# Patient Record
Sex: Male | Born: 1955 | Race: Black or African American | Hispanic: No | Marital: Married | Smoking: Never smoker
Health system: Southern US, Community
[De-identification: ages and names within clinical notes are randomized; demographics above are authoritative.]

## PROBLEM LIST (undated history)

## (undated) DIAGNOSIS — C801 Malignant (primary) neoplasm, unspecified: Secondary | ICD-10-CM

## (undated) DIAGNOSIS — R748 Abnormal levels of other serum enzymes: Secondary | ICD-10-CM

## (undated) DIAGNOSIS — N1831 Chronic kidney disease, stage 3a: Secondary | ICD-10-CM

## (undated) DIAGNOSIS — C7951 Secondary malignant neoplasm of bone: Secondary | ICD-10-CM

## (undated) DIAGNOSIS — I10 Essential (primary) hypertension: Secondary | ICD-10-CM

## (undated) DIAGNOSIS — C61 Malignant neoplasm of prostate: Secondary | ICD-10-CM

## (undated) HISTORY — PX: NO PAST SURGERIES: SHX2092

## (undated) HISTORY — DX: Abnormal levels of other serum enzymes: R74.8

## (undated) HISTORY — DX: Secondary malignant neoplasm of bone: C61

## (undated) HISTORY — DX: Chronic kidney disease, stage 3a: N18.31

## (undated) HISTORY — DX: Malignant neoplasm of prostate: C79.51

---

## 2011-03-17 ENCOUNTER — Inpatient Hospital Stay (HOSPITAL_COMMUNITY)
Admission: EM | Admit: 2011-03-17 | Discharge: 2011-03-20 | DRG: 304 | Disposition: A | Discharge status: Home / Self Care | Payer: Managed Care, Other (non HMO) | Attending: Cardiology | Admitting: Cardiology

## 2011-03-17 ENCOUNTER — Emergency Department (HOSPITAL_COMMUNITY): Payer: Managed Care, Other (non HMO)

## 2011-03-17 DIAGNOSIS — I10 Essential (primary) hypertension: Principal | ICD-10-CM | POA: Diagnosis present

## 2011-03-17 DIAGNOSIS — E119 Type 2 diabetes mellitus without complications: Secondary | ICD-10-CM | POA: Diagnosis present

## 2011-03-17 DIAGNOSIS — I5023 Acute on chronic systolic (congestive) heart failure: Secondary | ICD-10-CM | POA: Diagnosis present

## 2011-03-17 DIAGNOSIS — I509 Heart failure, unspecified: Secondary | ICD-10-CM | POA: Diagnosis present

## 2011-03-17 DIAGNOSIS — Z7982 Long term (current) use of aspirin: Secondary | ICD-10-CM

## 2011-03-17 LAB — COMPREHENSIVE METABOLIC PANEL
ALT: 25 U/L (ref 0–53)
AST: 25 U/L (ref 0–37)
Albumin: 2.9 g/dL — ABNORMAL LOW (ref 3.5–5.2)
Alkaline Phosphatase: 91 U/L (ref 39–117)
CO2: 31 mEq/L (ref 19–32)
Chloride: 105 mEq/L (ref 96–112)
Creatinine, Ser: 0.92 mg/dL (ref 0.4–1.5)
GFR calc Af Amer: >60 mL/min (ref >60)
GFR calc non Af Amer: >60 mL/min (ref >60)
Potassium: 3.4 mEq/L — ABNORMAL LOW (ref 3.5–5.1)
Sodium: 143 mEq/L (ref 135–145)
Total Bilirubin: 0.7 mg/dL (ref 0.3–1.2)

## 2011-03-17 LAB — BASIC METABOLIC PANEL
BUN: 15 mg/dL (ref 6–23)
CO2: 28 mEq/L (ref 19–32)
GFR calc non Af Amer: >60 mL/min (ref >60)
Glucose, Bld: 120 mg/dL — ABNORMAL HIGH (ref 70–99)
Potassium: 3.4 mEq/L — ABNORMAL LOW (ref 3.5–5.1)
Sodium: 141 mEq/L (ref 135–145)

## 2011-03-17 LAB — CBC
MCH: 24.8 pg — ABNORMAL LOW (ref 26.0–34.0)
Platelets: 184 10*3/uL (ref 150–400)
RBC: 5.32 MIL/uL (ref 4.22–5.81)
RDW: 15.9 % — ABNORMAL HIGH (ref 11.5–15.5)
WBC: 5.9 10*3/uL (ref 4.0–10.5)

## 2011-03-17 LAB — HEPARIN LEVEL (UNFRACTIONATED): Heparin Unfractionated: 0.41 IU/mL (ref 0.30–0.70)

## 2011-03-17 LAB — APTT: aPTT: 28 seconds (ref 24–37)

## 2011-03-17 LAB — DIFFERENTIAL
Basophils Relative: 1 % (ref 0–1)
Eosinophils Absolute: 0.1 10*3/uL (ref 0.0–0.7)
Eosinophils Relative: 1 % (ref 0–5)
Neutrophils Relative %: 65 % (ref 43–77)

## 2011-03-17 LAB — CARDIAC PANEL(CRET KIN+CKTOT+MB+TROPI): Total CK: 109 U/L (ref 7–232)

## 2011-03-17 LAB — CK TOTAL AND CKMB (NOT AT ARMC): Total CK: 134 U/L (ref 7–232)

## 2011-03-17 LAB — TROPONIN I: Troponin I: <0.3 ng/mL (ref <0.30)

## 2011-03-18 LAB — CARDIAC PANEL(CRET KIN+CKTOT+MB+TROPI)
CK, MB: 1.5 ng/mL (ref 0.3–4.0)
Relative Index: INVALID (ref 0.0–2.5)
Total CK: 86 U/L (ref 7–232)

## 2011-03-18 LAB — LIPID PANEL
Cholesterol: 110 mg/dL (ref 0–200)
VLDL: 11 mg/dL (ref 0–40)

## 2011-03-18 LAB — BASIC METABOLIC PANEL
CO2: 27 mEq/L (ref 19–32)
Calcium: 8.8 mg/dL (ref 8.4–10.5)
Creatinine, Ser: 1.22 mg/dL (ref 0.4–1.5)
GFR calc Af Amer: >60 mL/min (ref >60)
GFR calc non Af Amer: >60 mL/min (ref >60)
Sodium: 140 mEq/L (ref 135–145)

## 2011-03-18 LAB — CBC
HCT: 36.2 % — ABNORMAL LOW (ref 39.0–52.0)
Hemoglobin: 12.1 g/dL — ABNORMAL LOW (ref 13.0–17.0)
MCV: 74 fL — ABNORMAL LOW (ref 78.0–100.0)
RBC: 4.89 MIL/uL (ref 4.22–5.81)
RDW: 15.8 % — ABNORMAL HIGH (ref 11.5–15.5)
WBC: 6.6 10*3/uL (ref 4.0–10.5)

## 2011-03-18 LAB — MAGNESIUM: Magnesium: 1.7 mg/dL (ref 1.5–2.5)

## 2011-03-19 ENCOUNTER — Inpatient Hospital Stay (HOSPITAL_COMMUNITY): Payer: Managed Care, Other (non HMO)

## 2011-03-19 LAB — CBC
Hemoglobin: 11.3 g/dL — ABNORMAL LOW (ref 13.0–17.0)
Platelets: 162 10*3/uL (ref 150–400)
RBC: 4.59 MIL/uL (ref 4.22–5.81)
WBC: 6 10*3/uL (ref 4.0–10.5)

## 2011-03-19 LAB — HEPARIN LEVEL (UNFRACTIONATED): Heparin Unfractionated: 0.62 IU/mL (ref 0.30–0.70)

## 2011-03-19 MED ORDER — TECHNETIUM TC 99M TETROFOSMIN IV KIT
10.0000 | PACK | Freq: Once | INTRAVENOUS | Status: AC | PRN
  Administered 2011-03-19: 10 via INTRAVENOUS

## 2011-03-19 MED ORDER — TECHNETIUM TC 99M TETROFOSMIN IV KIT
30.0000 | PACK | Freq: Once | INTRAVENOUS | Status: AC | PRN
  Administered 2011-03-19: 30 via INTRAVENOUS

## 2011-03-20 ENCOUNTER — Inpatient Hospital Stay (HOSPITAL_COMMUNITY): Payer: Managed Care, Other (non HMO)

## 2011-03-20 LAB — BASIC METABOLIC PANEL
CO2: 29 mEq/L (ref 19–32)
Calcium: 8.9 mg/dL (ref 8.4–10.5)
Chloride: 101 mEq/L (ref 96–112)
Creatinine, Ser: 1.1 mg/dL (ref 0.4–1.5)
GFR calc Af Amer: >60 mL/min (ref >60)
Glucose, Bld: 141 mg/dL — ABNORMAL HIGH (ref 70–99)
Sodium: 138 mEq/L (ref 135–145)

## 2011-03-20 LAB — CBC
HCT: 35.7 % — ABNORMAL LOW (ref 39.0–52.0)
Hemoglobin: 12.1 g/dL — ABNORMAL LOW (ref 13.0–17.0)
MCH: 25 pg — ABNORMAL LOW (ref 26.0–34.0)
MCHC: 33.9 g/dL (ref 30.0–36.0)
RBC: 4.84 MIL/uL (ref 4.22–5.81)

## 2011-03-20 LAB — GLUCOSE, CAPILLARY

## 2011-04-08 NOTE — Discharge Summary (Signed)
Carlos Martin, Carlos Martin               ACCOUNT NO.:  [id number]  MEDICAL RECORD NO.:  [id number]           PATIENT TYPE:  I  LOCATION:  4730                         FACILITY:  [facility]  PHYSICIAN:  Toddy Boyd N. Sharyn Lull, M.D. DATE OF BIRTH:  [birth year]  DATE OF ADMISSION:  03/17/2011 DATE OF DISCHARGE:  03/20/2011                              DISCHARGE SUMMARY   ADMITTING DIAGNOSES:  Hypertensive urgency, decompensated systolic heart failure, neck fullness, rule out coronary insufficiency, new onset hypertension, glucose intolerance, rule out diabetes mellitus.  DISCHARGE DIAGNOSES:  Status post hypertensive urgency, compensated systolic heart failure, hypertension, new onset diabetes mellitus controlled by diet.  DISCHARGE HOME MEDICATIONS: 1. Aldactone 25 mg 1 tablet twice daily. 2. Enteric-coated aspirin 81 mg 1 tablet daily. 3. Carvedilol 25 mg 1 tablet twice daily. 4. Lisinopril 240 mg 1 tablet twice daily. 5. Benicar 20 mg 1 tablet twice daily. 6. Fish oil over-the-counter 1 capsule daily. 7. Multivitamin 1 tablet daily.  DIET:  Low salt, low cholesterol 1800 calories ADA diet.  The patient has been advised to monitor weight and blood pressure and blood sugar daily.  Avoid sweets.  Follow up with me in 1 week.  Cardiac heart failure instructions have been given.  The patient has been advised to call my office immediately if he develops shortness of breath, leg swelling, or weight gain of over 3 pounds overnight or 5 pounds in a week or if he has any swelling in hand, feet, stomach, or problem breathing.  CONDITION AT DISCHARGE:  Stable.  Follow up with me in 1 week.  BRIEF HISTORY AND HOSPITAL COURSE:  [name] is 55 year old black male with no significant past medical history came to the ER via EMS from PMD office because of progressive increasing shortness of breath and was noted to have blood pressure of 216/150 and was noted in mild congestive heart failure.  The patient  denies any chest pain, nausea, vomiting, diaphoresis but complains of fullness in neck.  Denies palpitation, lightheadedness, or syncope.  The patient does give history of PND, orthopnea, and mild leg swelling.  Denies any cough, fever, flu recently.  He states he could not sleep on the bed and has been sitting in the recliner for last few days.  Denies any cough, fever, or chills. Denies taking any BP meds in the past.  Denies any cardiac workup in the past.  PAST MEDICAL HISTORY:  As above.  PAST SURGICAL HISTORY:  None.  ALLERGIES:  None.  MEDICATION AT HOME:  He takes fish oil capsule and multivitamin every day.  SOCIAL HISTORY:  He is married, 4 children.  No history of smoking. Drinks wine occasionally, works for [employer] in [employer].  FAMILY HISTORY:  Noncontributory.  PHYSICAL EXAMINATION:  GENERAL:  He was alert, awake, oriented x3 in no acute distress. VITAL SIGNS:  Blood pressure was 208/139, pulse was 81 and regular. HEENT:  Conjunctivae was pink. NECK:  Supple, positive JVD.  He had bibasilar rales. CARDIOVASCULAR:  S1 and S2 normal.  There was soft systolic murmur and S4 gallop and S3 gallop. ABDOMEN:  Soft.  Bowel  sounds were present and nontender. EXTREMITIES: There was no clubbing, cyanosis, or edema.  LABORATORY DATA:  Hemoglobin was 13.2, hematocrit 39.5, white count of 5.9.  His sodium was 141, potassium 3.4, BUN 15, creatinine 0.89, glucose was 120.  Pro BNP was 1865, troponin-I was normal.  CPK-MBs were normal.  Repeat pro BNP on May 31 was 1578 which is trending down. Magnesium was 1.7.  Today's CBC is 12.1, hematocrit 35.7, white count of 5.5.  His sodium is 138, potassium 3.9, BUN 19, creatinine 1.10, and glucose is 141.  His chest x-ray done on May 30 showed mild congestive heart failure.  Repeat x-ray done on June 1 showed cardiomegaly with no active disease.  Persantine Myoview done yesterday which showed no evidence of inducible  ischemia, global hypokinesia with EF of 32%.  BRIEF HOSPITAL COURSE:  The patient was admitted to CCU, was started on IV heparin, nitrates, IV Lasix with good diuresis and slowly up titrated his beta-blockers and ACE inhibitors.  The patient subsequently underwent Persantine Myoview which showed no evidence of ischemia with EF of 32%.  The patient has been ambulating in hallway without any problem.  His breathing is back to normal.  Blood pressure is fairly well-controlled.  The patient will be discharged home on above medications and will be followed up in my office in 1 week.  We will repeat his 2-D echo in few months.  If his ejection fraction remains below 35%, we will refer him for ICD.     Eduardo Osier. Sharyn Lull, M.D.     [facility]  D:  03/20/2011  T:  03/20/2011  Job:  [id number]  Electronically Signed by Rinaldo Cloud M.D. on 04/08/2011 09:43:15 AM

## 2011-12-03 ENCOUNTER — Other Ambulatory Visit: Payer: Self-pay | Admitting: Cardiology

## 2012-02-25 ENCOUNTER — Other Ambulatory Visit: Payer: Self-pay | Admitting: Cardiology

## 2012-06-12 ENCOUNTER — Other Ambulatory Visit: Payer: Self-pay | Admitting: Cardiology

## 2013-06-27 ENCOUNTER — Encounter (HOSPITAL_COMMUNITY): Payer: Self-pay | Admitting: Family Medicine

## 2013-06-27 ENCOUNTER — Emergency Department (HOSPITAL_COMMUNITY): Payer: BC Managed Care – PPO

## 2013-06-27 ENCOUNTER — Emergency Department (HOSPITAL_COMMUNITY)
Admission: EM | Admit: 2013-06-27 | Discharge: 2013-06-27 | Disposition: A | Discharge status: Home / Self Care | Payer: BC Managed Care – PPO | Attending: Emergency Medicine | Admitting: Emergency Medicine

## 2013-06-27 DIAGNOSIS — Z79899 Other long term (current) drug therapy: Secondary | ICD-10-CM | POA: Insufficient documentation

## 2013-06-27 DIAGNOSIS — J029 Acute pharyngitis, unspecified: Secondary | ICD-10-CM

## 2013-06-27 DIAGNOSIS — M542 Cervicalgia: Secondary | ICD-10-CM

## 2013-06-27 DIAGNOSIS — M2569 Stiffness of other specified joint, not elsewhere classified: Secondary | ICD-10-CM | POA: Insufficient documentation

## 2013-06-27 DIAGNOSIS — M436 Torticollis: Secondary | ICD-10-CM

## 2013-06-27 DIAGNOSIS — I10 Essential (primary) hypertension: Secondary | ICD-10-CM | POA: Insufficient documentation

## 2013-06-27 HISTORY — DX: Essential (primary) hypertension: I10

## 2013-06-27 LAB — COMPREHENSIVE METABOLIC PANEL
Alkaline Phosphatase: 56 U/L (ref 39–117)
BUN: 20 mg/dL (ref 6–23)
Chloride: 97 mEq/L (ref 96–112)
Creatinine, Ser: 1.06 mg/dL (ref 0.50–1.35)
GFR calc Af Amer: >90 mL/min (ref >90)
GFR calc non Af Amer: 78 mL/min — ABNORMAL LOW (ref >90)
Glucose, Bld: 104 mg/dL — ABNORMAL HIGH (ref 70–99)
Potassium: 5.6 mEq/L — ABNORMAL HIGH (ref 3.5–5.1)
Total Bilirubin: 0.2 mg/dL — ABNORMAL LOW (ref 0.3–1.2)

## 2013-06-27 LAB — CBC
HCT: 34.4 % — ABNORMAL LOW (ref 39.0–52.0)
Hemoglobin: 11.6 g/dL — ABNORMAL LOW (ref 13.0–17.0)
MCV: 73.7 fL — ABNORMAL LOW (ref 78.0–100.0)
WBC: 6.8 10*3/uL (ref 4.0–10.5)

## 2013-06-27 LAB — CSF CELL COUNT WITH DIFFERENTIAL
RBC Count, CSF: 0 /mm3
RBC Count, CSF: 22 /mm3 — ABNORMAL HIGH
Tube #: 1
Tube #: 4
WBC, CSF: 1 /mm3 (ref 0–5)
WBC, CSF: 1 /mm3 (ref 0–5)

## 2013-06-27 LAB — GRAM STAIN

## 2013-06-27 LAB — GLUCOSE, CSF: Glucose, CSF: 69 mg/dL (ref 43–76)

## 2013-06-27 LAB — PROTEIN, CSF: Total  Protein, CSF: 49 mg/dL — ABNORMAL HIGH (ref 15–45)

## 2013-06-27 MED ORDER — IBUPROFEN 800 MG PO TABS
800.0000 mg | ORAL_TABLET | Freq: Once | ORAL | Status: DC
  Filled 2013-06-27: qty 1

## 2013-06-27 MED ORDER — OXYCODONE-ACETAMINOPHEN 5-325 MG PO TABS: ORAL_TABLET | ORAL | Status: DC

## 2013-06-27 MED ORDER — DIAZEPAM 5 MG PO TABS
10.0000 mg | ORAL_TABLET | Freq: Once | ORAL | Status: DC
  Filled 2013-06-27 (×2): qty 2

## 2013-06-27 MED ORDER — MORPHINE SULFATE 4 MG/ML IJ SOLN
6.0000 mg | Freq: Once | INTRAMUSCULAR | Status: AC
  Administered 2013-06-27: 6 mg via INTRAVENOUS
  Filled 2013-06-27: qty 2

## 2013-06-27 MED ORDER — DIAZEPAM 5 MG PO TABS: 5.0000 mg | ORAL_TABLET | Freq: Two times a day (BID) | ORAL | Status: DC

## 2013-06-27 MED ORDER — LORAZEPAM 2 MG/ML IJ SOLN
1.0000 mg | Freq: Once | INTRAMUSCULAR | Status: AC
  Administered 2013-06-27: 1 mg via INTRAVENOUS
  Filled 2013-06-27: qty 1

## 2013-06-27 NOTE — ED Provider Notes (Signed)
CSN: [id number]     Arrival date & time 06/27/13  1105 History   First MD Initiated Contact with Patient 06/27/13 1208     Chief Complaint  Patient presents with  . Neck Pain  . Sore Throat   (Consider location/radiation/quality/duration/timing/severity/associated sxs/prior Treatment) HPI Pt is a 57yo male sent over by [facility] to r/o meningitis.  Pt c/o severe neck pain, 9/10, constant, aching pain in neck, associated with sore throat made worse with swallowing.  Symptoms started yesterday around 1am after waking up. Denies cough, chest pain, SOB, headache, change in vision. Denies fever, n/v/d. Denies trauma to neck. Denies sick contacts or recent travel. Denies previous hx of similar symptoms.   Past Medical History  Diagnosis Date  . Hypertension    History reviewed. No pertinent past surgical history. History reviewed. No pertinent family history. History  Substance Use Topics  . Smoking status: Never Smoker   . Smokeless tobacco: Not on file  . Alcohol Use: No    Review of Systems  Constitutional: Negative for fever and chills.  HENT: Positive for sore throat, neck pain and neck stiffness. Negative for drooling, mouth sores, trouble swallowing and voice change.   Respiratory: Negative for cough and shortness of breath.   Cardiovascular: Negative for chest pain.  Gastrointestinal: Negative for nausea, vomiting, abdominal pain and diarrhea.  Musculoskeletal: Negative for back pain.  Skin: Negative for rash.  All other systems reviewed and are negative.    Allergies  Review of patient's allergies indicates no known allergies.  Home Medications   Current Outpatient Rx  Name  Route  Sig  Dispense  Refill  . amLODipine-olmesartan (AZOR) 10-40 MG per tablet   Oral   Take 1 tablet by mouth daily.         . carvedilol (COREG) 25 MG tablet   Oral   Take 25 mg by mouth daily.         . hydroxypropyl methylcellulose (ISOPTO TEARS) 2.5 % ophthalmic solution   Both Eyes  Place 1 drop into both eyes 4 (four) times daily as needed (dry eyes).         Marland Kitchen lisinopril (PRINIVIL,ZESTRIL) 40 MG tablet   Oral   Take 40 mg by mouth daily.         . Multiple Vitamin (MULTIVITAMIN WITH MINERALS) TABS tablet   Oral   Take 1 tablet by mouth daily.         Marland Kitchen spironolactone (ALDACTONE) 25 MG tablet   Oral   Take 25 mg by mouth daily.         . diazepam (VALIUM) 5 MG tablet   Oral   Take 1 tablet (5 mg total) by mouth 2 (two) times daily.   10 tablet   0   . oxyCODONE-acetaminophen (PERCOCET/ROXICET) 5-325 MG per tablet      Take 1-2 pills every 4-6 hours as needed for pain.   10 tablet   0    BP 124/65  Pulse 75  Temp(Src) 98.6 F (37 C) (Oral)  Resp 17  SpO2 99% Physical Exam  Nursing note and vitals reviewed. Constitutional: He appears well-developed and well-nourished.  Pt sitting comfortably in exam bed. NAD.  HENT:  Head: Normocephalic and atraumatic.  Right Ear: Hearing, tympanic membrane, external ear and ear canal normal.  Left Ear: Hearing, tympanic membrane, external ear and ear canal normal.  Nose: Nose normal.  Mouth/Throat: Uvula is midline and mucous membranes are normal. Posterior oropharyngeal erythema present. No oropharyngeal  exudate or posterior oropharyngeal edema.  Eyes: Conjunctivae are normal. No scleral icterus.  Neck: Normal range of motion. No JVD present. No tracheal deviation present. No thyromegaly present.  Stiff neck, TTP along cervical paraspinal muscles.  Unable to flex, extend, or rotate neck due to pain.  Cardiovascular: Normal rate, regular rhythm and normal heart sounds.   Pulmonary/Chest: Effort normal and breath sounds normal. No stridor. No respiratory distress. He has no wheezes. He has no rales. He exhibits no tenderness.  Abdominal: Soft. Bowel sounds are normal. He exhibits no distension and no mass. There is no tenderness. There is no rebound and no guarding.  Musculoskeletal: Normal range of  motion.  Lymphadenopathy:    He has no cervical adenopathy.  Neurological: He is alert.  Skin: Skin is warm and dry.    ED Course  Procedures (including critical care time) Labs Review Labs Reviewed  CBC - Abnormal; Notable for the following:    Hemoglobin 11.6 (*)    HCT 34.4 (*)    MCV 73.7 (*)    MCH 24.8 (*)    All other components within normal limits  COMPREHENSIVE METABOLIC PANEL - Abnormal; Notable for the following:    Sodium 130 (*)    Potassium 5.6 (*)    Glucose, Bld 104 (*)    Total Protein 8.8 (*)    AST 38 (*)    Total Bilirubin 0.2 (*)    GFR calc non Af Amer 78 (*)    All other components within normal limits  CSF CELL COUNT WITH DIFFERENTIAL - Abnormal; Notable for the following:    RBC Count, CSF 22 (*)    All other components within normal limits  PROTEIN, CSF - Abnormal; Notable for the following:    Total  Protein, CSF 49 (*)    All other components within normal limits  GRAM STAIN  CSF CULTURE  HERPES SIMPLEX VIRUS CULTURE  CSF CELL COUNT WITH DIFFERENTIAL  GLUCOSE, CSF  HERPES SIMPLEX VIRUS(HSV) DNA BY PCR   Imaging Review Ct Head Wo Contrast  06/27/2013   *RADIOLOGY REPORT*  Clinical Data: Sore throat and neck pain  CT HEAD WITHOUT CONTRAST  Technique:  Contiguous axial images were obtained from the base of the skull through the vertex without contrast. Study was obtained within 24 hours of patient arrival at the emergency department.  Comparison: None.  Findings:  The ventricles are normal in size and configuration. There is no mass, hemorrhage, extra-axial fluid collection, or midline shift.  Gray-white compartments are normal.  There is no demonstrable acute infarct.  There is no meningeal region of thickening.  Bony calvarium appears intact.  Mastoid air cells are clear.  There is mucosal thickening in the right maxillary antrum.  IMPRESSION: Right maxillary sinus disease.  Study otherwise unremarkable.   Original Report Authenticated By: Bretta Bang, M.D.   Dg Fluoro Guide Lumbar Puncture  06/27/2013   CLINICAL DATA:  57 year old male with neck pain, neck stiffness.  EXAM: DIAGNOSTIC LUMBAR PUNCTURE UNDER FLUOROSCOPIC GUIDANCE  FLUOROSCOPY TIME:  0 min and 58 seconds.  PROCEDURE: Informed consent was obtained from the patient prior to the procedure, including potential complications of headache, allergy, and pain. With the patient prone, the lower back was prepped with Betadine. 1% Lidocaine was used for local anesthesia. Lumbar puncture was performed at the L2-L3 level using left sub laminar technique, a 3.5 inch x 20 gauge needle with return of clear, colorlessCSF with an opening pressure of 21 cm water.  12 ml of CSF were obtained for laboratory studies. The needle was withdrawn, direct pressure was held , and hemostasis was noted. The patient tolerated the procedure well and there were no apparent complications.  IMPRESSION: Fluoroscopic guided lumbar puncture at L2-L3. 12 mL of CSF collected and sent to the lab for analysis.   Electronically Signed   By: Augusto Gamble M.D.   On: 06/27/2013 17:14    MDM   1. Neck pain   2. Neck stiffness   3. Sore throat    Some concern for meningitis due to c/o stiff neck and sore throat, however, pt appears well, non-toxic. Vitals: unremarkable.   LP attempted by Dr. Ranae Palms but unsuccessful.  Consulted IR for repeat LP.  IR requested CT head.    LP was performed with CSF cultures and cell counts unremarkable.  Pt still appears well, non-toxic. Normal vitals.  Pain has improved with ativan and morphine.  Discussed findings with Dr. Ranae Palms who agrees pt may be sent home with pain medication and muscle relaxer.  No antibiotics are indicated at this time.  Rx: percocet and valium.  Return precautions provided. LP aftercare info packet provided.  Pt and wife verbalized understanding and agreement with tx plan.  Will discharge pt home and have her f/u with [facility] info  provided.. Vitals: unremarkable. Discharged in stable condition.    Discussed pt with attending during ED encounter.   Junius Finner, PA-C 06/27/13 1954

## 2013-06-27 NOTE — Procedures (Signed)
.  Procedure: LP with fluoro guidance. Specimen: CSF, to lab Bleeding: minimal. Complications: None immediate. Patient   -Condition: Stable.  -Disposition:  Stable, back to ED.  full Radiology Report to follow under IMAGING

## 2013-06-27 NOTE — ED Notes (Signed)
Per pt sent here from [facility] to r/o meningitis. sts severe neck pain and soret throat since yesterday. Denies fever, N,V,D.

## 2013-06-28 LAB — HERPES SIMPLEX VIRUS(HSV) DNA BY PCR
HSV 1 DNA: NOT DETECTED
HSV 2 DNA: NOT DETECTED

## 2013-06-28 NOTE — ED Provider Notes (Signed)
Medical screening examination/treatment/procedure(s) were conducted as a shared visit with non-physician practitioner(s) and myself.  I personally evaluated the patient during the encounter Patient referred from urgent care center for 1 day of neck stiffness and concern for meningitis. He  denies fevers, chills, headache, focal weakness or numbness. He has no sick contacts. There is no history of trauma to the neck. Patient with difficulty moving his neck. He has a normal neurologic examination. He has no evidence of increased intracranial pressure.  Discussed the risks and benefits of a lumbar puncture. I explained to him that I do not believe that the patient had meningitis though could not be absolutely sure. Patient's wife stated they wanted to have the procedure performed.   LP procedure note: The patient's signs and written consents. He is aware of the risks and benefits of the procedure. Patient was prepped and draped in a sterile fashion. 3 ml of 1% lidocaine was injected in the L2-L3 . Lumbar puncture was unsuccessful for acquiring CSF fluid in the emergency department. No immediate complications. No bleeding.  Patient had LP performed under fluoroscopy guidance by interventional radiology. They asked that he have a CT of his head performed for the procedure. CSF did not show any evidence of infection. Patient was treated for muscle spasm of the neck. Return precautions have been given.  Loren Racer, MD 06/28/13 1547

## 2013-06-30 LAB — CSF CULTURE: Culture: NO GROWTH

## 2013-06-30 LAB — CSF CULTURE W GRAM STAIN: Gram Stain: NONE SEEN

## 2014-10-24 IMAGING — RF DG FLUORO GUIDE LUMBAR PUNCTURE
1 series · 1 of 1 positions shown · non-contrast
Comparison: none

CLINICAL DATA: 56-year-old male with neck pain, neck stiffness.

[Series 1: run · 1 of 1 slices shown]
[im 1/1]
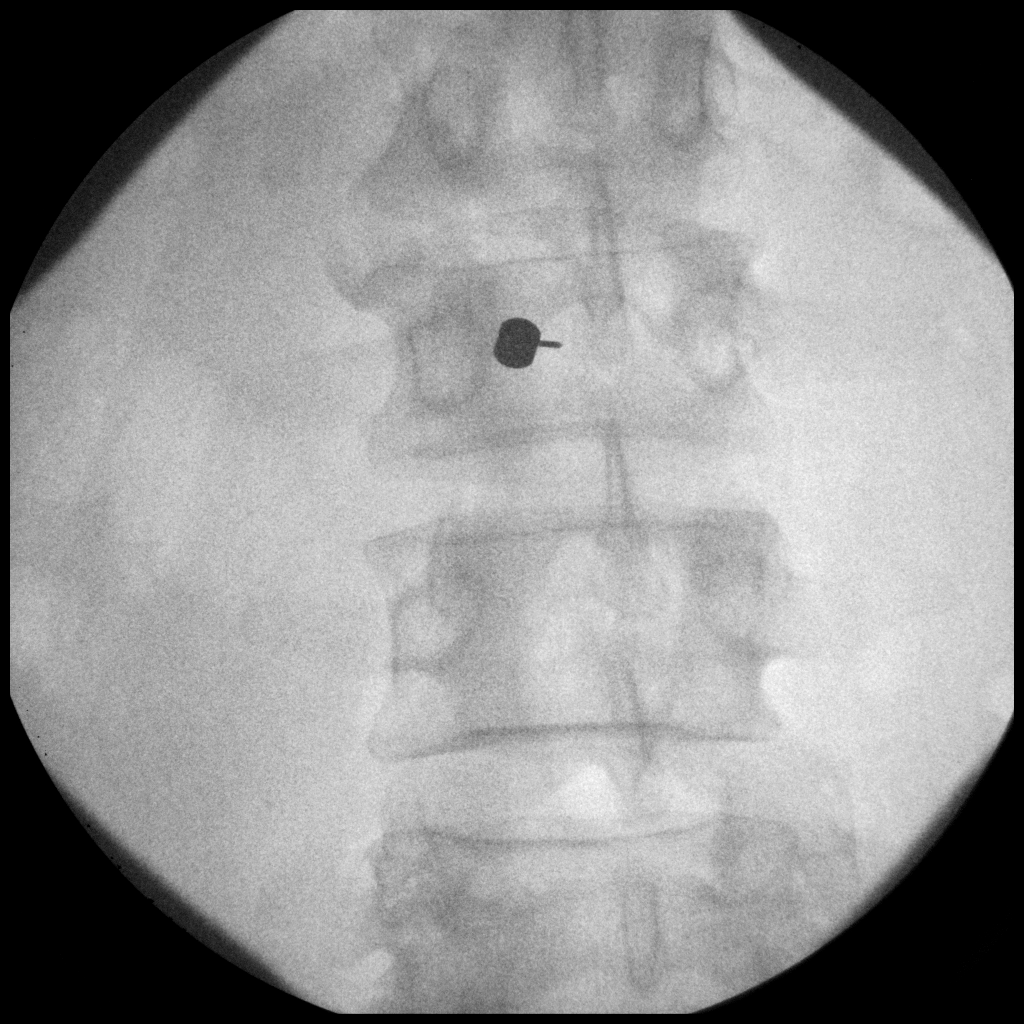

[1 of 1 positions shown; findings below may reference images not displayed]

EXAM:
DIAGNOSTIC LUMBAR PUNCTURE UNDER FLUOROSCOPIC GUIDANCE

FLUOROSCOPY TIME:  0 min and 58 seconds.

PROCEDURE:
Informed consent was obtained from the patient prior to the
procedure, including potential complications of headache, allergy,
and pain. With the patient prone, the lower back was prepped with
Betadine. 1% Lidocaine was used for local anesthesia. Lumbar
puncture was performed at the L2-L3 level using left sub laminar
technique, a 3.5 inch x 20 gauge needle with return of clear,
colorlessCSF with an opening pressure of 21 cm water. 12 ml of CSF
were obtained for laboratory studies. The needle was withdrawn,
direct pressure was held , and hemostasis was noted. The patient
tolerated the procedure well and there were no apparent
complications.
IMPRESSION: Fluoroscopic guided lumbar puncture at L2-L3. 12 mL of CSF collected
and sent to the lab for analysis.

## 2014-10-24 IMAGING — CT CT HEAD W/O CM
1 series · 16 of 30 positions shown, 20 images · non-contrast
Comparison: None.

CLINICAL DATA: Sore throat and neck pain

CT HEAD WITHOUT CONTRAST
TECHNIQUE: Contiguous axial images were obtained from the base of
the skull through the vertex without contrast. Study was obtained
within 24 hours of patient arrival at the emergency department.

[Series 2: head 5.0 h30s · axial · 0.46mm/px · z∈[+1371,+1511]mm · 16 of 32 slices shown, 20 images]
[im 2/32  brain]
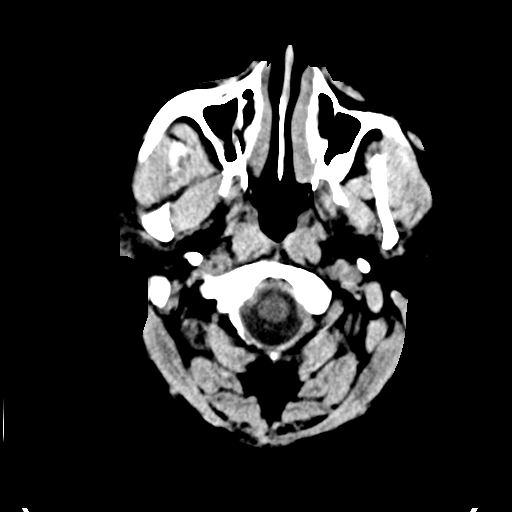
[im 2/32  bone]
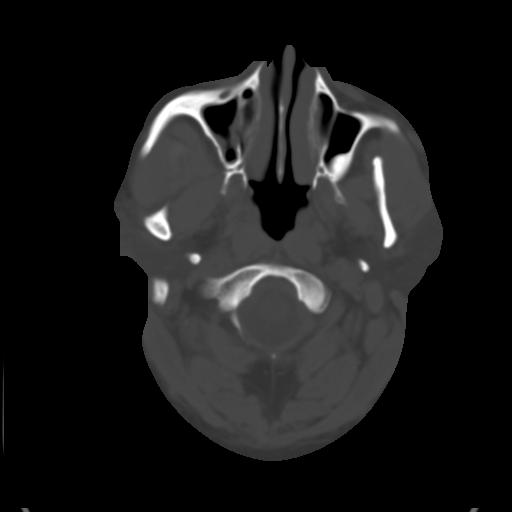
[im 4/32  brain]
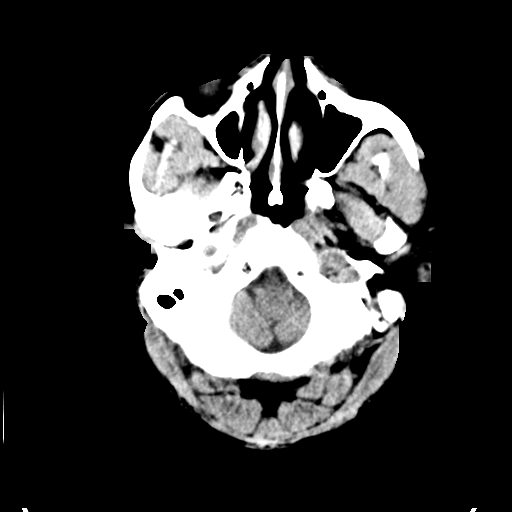
[im 6/32  brain]
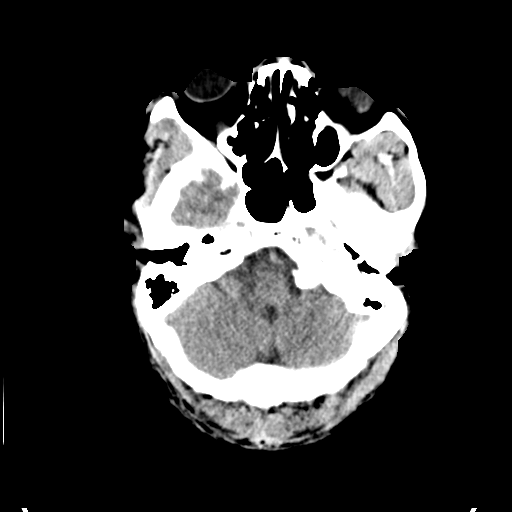
[im 8/32  brain]
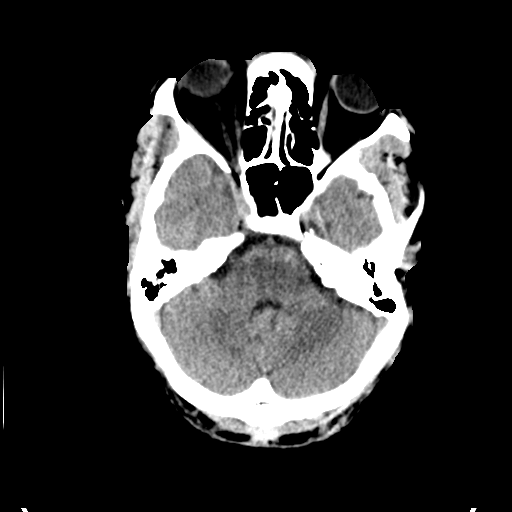
[im 9/32  brain]
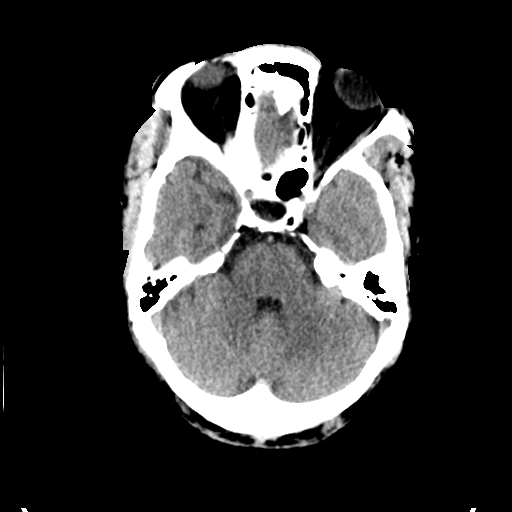
[im 9/32  bone]
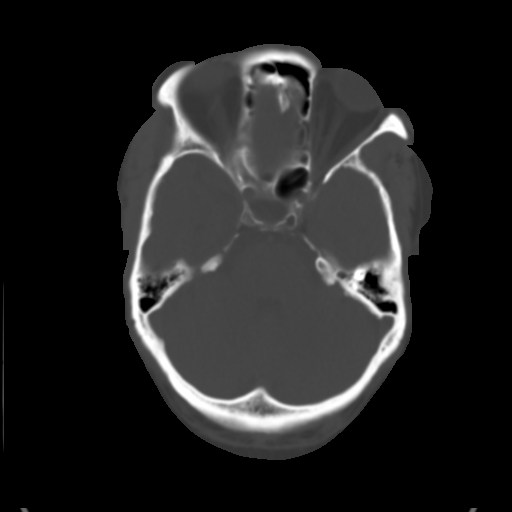
[im 11/32  brain]
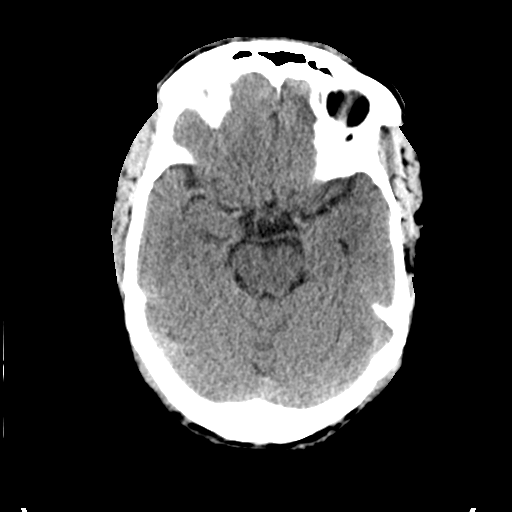
[im 13/32  brain]
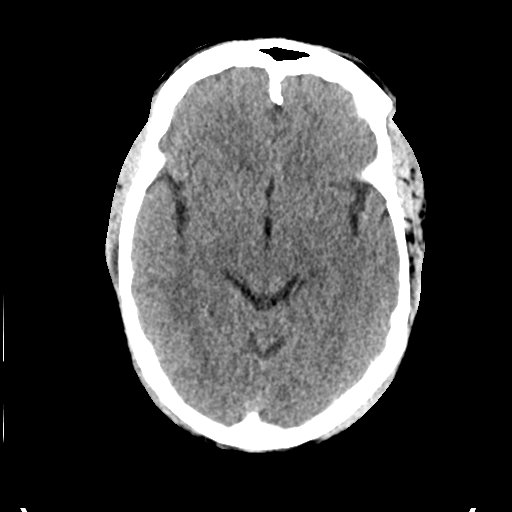
[im 15/32  brain]
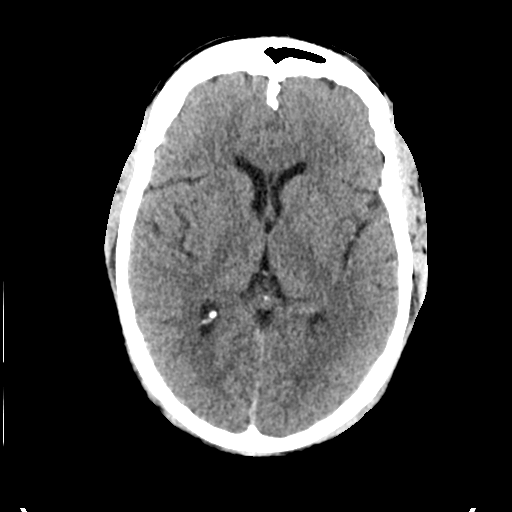
[im 17/32  brain]
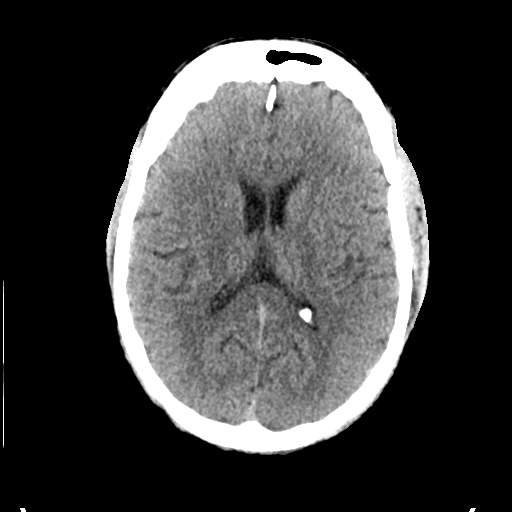
[im 17/32  bone]
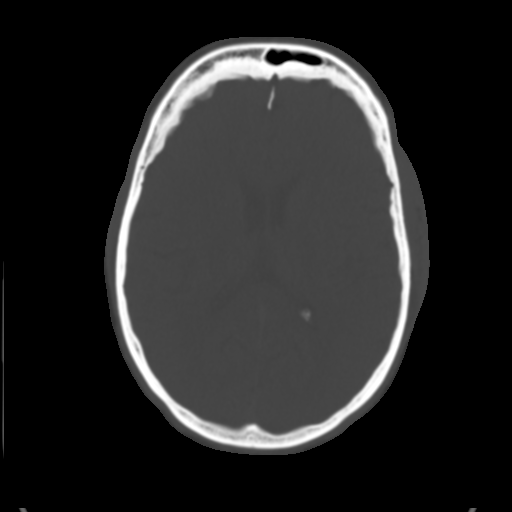
[im 19/32  brain]
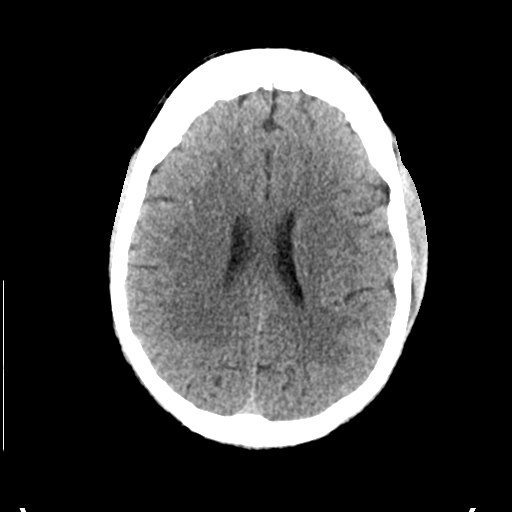
[im 21/32  brain]
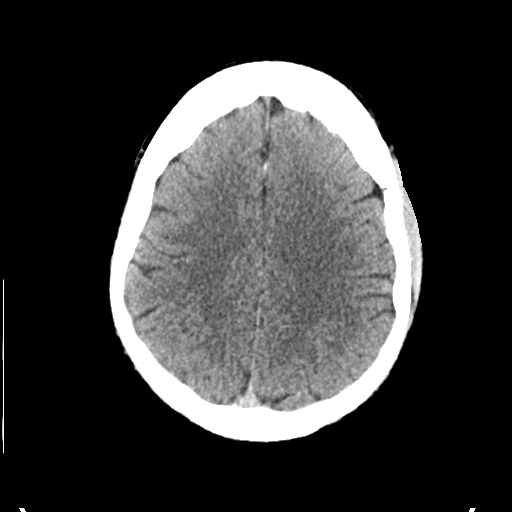
[im 23/32  brain]
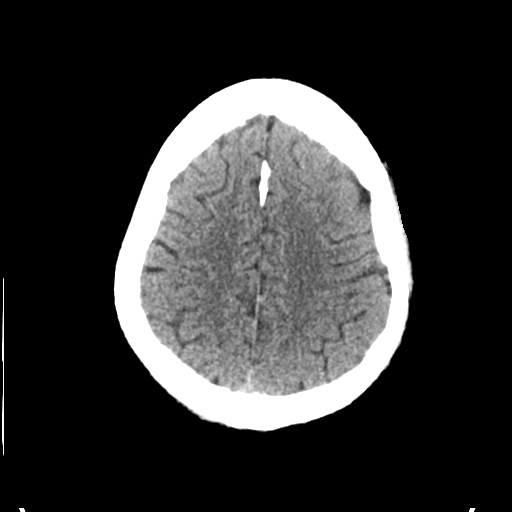
[im 24/32  brain]
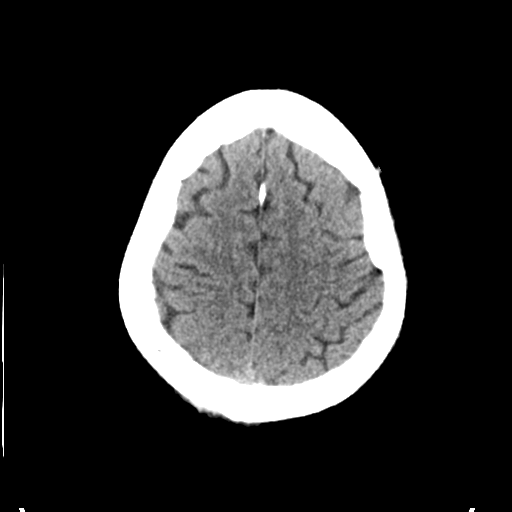
[im 24/32  bone]
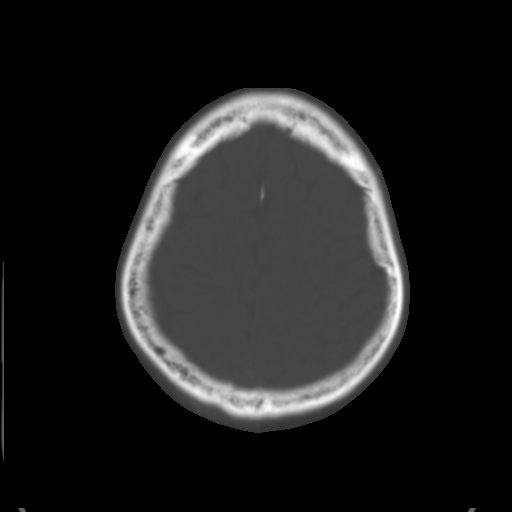
[im 26/32  brain]
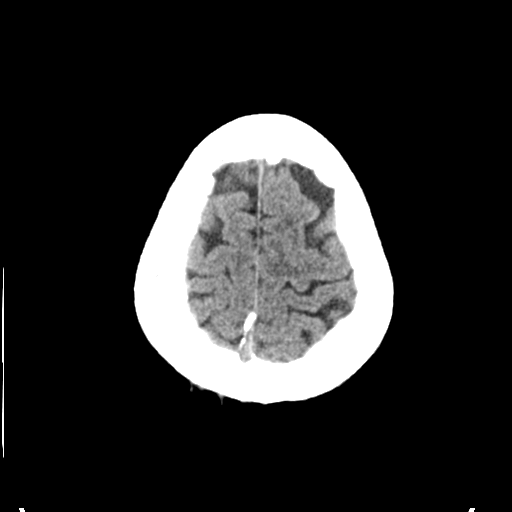
[im 28/32  brain]
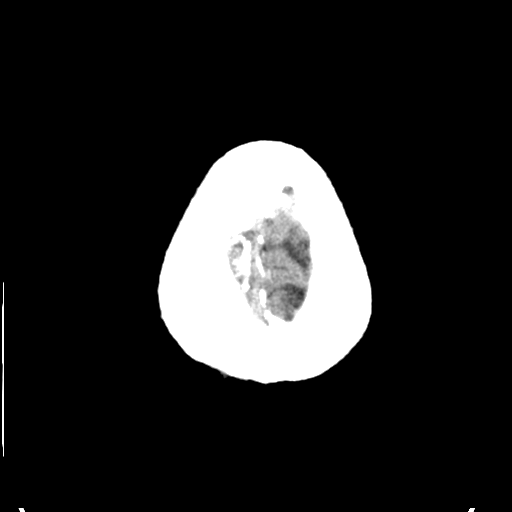
[im 30/32  brain]
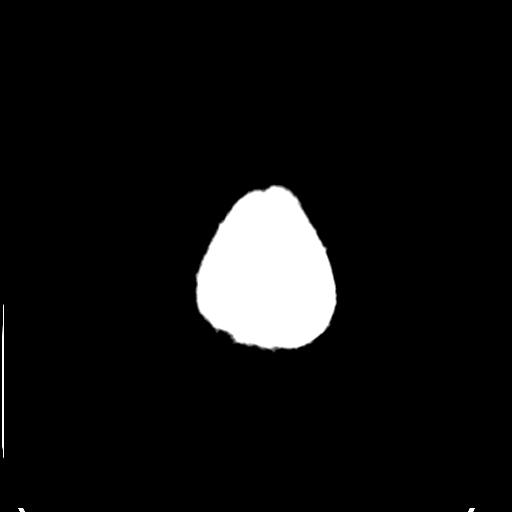

[16 of 30 positions shown; findings below may reference images not displayed]

FINDINGS: The ventricles are normal in size and configuration.
There is no mass, hemorrhage, extra-axial fluid collection, or
midline shift.  Gray-white compartments are normal.  There is no
demonstrable acute infarct.  There is no meningeal region of
thickening.

Bony calvarium appears intact.  Mastoid air cells are clear.  There
is mucosal thickening in the right maxillary antrum.
IMPRESSION: Right maxillary sinus disease.  Study otherwise
unremarkable.

## 2022-07-16 DIAGNOSIS — M545 Low back pain, unspecified: Secondary | ICD-10-CM | POA: Diagnosis not present

## 2022-10-18 HISTORY — PX: COLONOSCOPY: SHX174

## 2022-11-05 DIAGNOSIS — I10 Essential (primary) hypertension: Secondary | ICD-10-CM | POA: Diagnosis not present

## 2022-11-05 DIAGNOSIS — Z6830 Body mass index (BMI) 30.0-30.9, adult: Secondary | ICD-10-CM | POA: Diagnosis not present

## 2022-11-05 DIAGNOSIS — K649 Unspecified hemorrhoids: Secondary | ICD-10-CM | POA: Diagnosis not present

## 2022-11-05 DIAGNOSIS — E119 Type 2 diabetes mellitus without complications: Secondary | ICD-10-CM | POA: Diagnosis not present

## 2023-01-03 DIAGNOSIS — M549 Dorsalgia, unspecified: Secondary | ICD-10-CM | POA: Diagnosis not present

## 2023-01-03 DIAGNOSIS — K649 Unspecified hemorrhoids: Secondary | ICD-10-CM | POA: Diagnosis not present

## 2023-01-03 DIAGNOSIS — Z681 Body mass index (BMI) 19 or less, adult: Secondary | ICD-10-CM | POA: Diagnosis not present

## 2023-01-03 DIAGNOSIS — I10 Essential (primary) hypertension: Secondary | ICD-10-CM | POA: Diagnosis not present

## 2023-02-28 DIAGNOSIS — Z1211 Encounter for screening for malignant neoplasm of colon: Secondary | ICD-10-CM | POA: Diagnosis not present

## 2023-02-28 DIAGNOSIS — K5900 Constipation, unspecified: Secondary | ICD-10-CM | POA: Diagnosis not present

## 2023-02-28 DIAGNOSIS — K625 Hemorrhage of anus and rectum: Secondary | ICD-10-CM | POA: Diagnosis not present

## 2023-02-28 DIAGNOSIS — I5020 Unspecified systolic (congestive) heart failure: Secondary | ICD-10-CM | POA: Diagnosis not present

## 2023-03-04 DIAGNOSIS — M5442 Lumbago with sciatica, left side: Secondary | ICD-10-CM | POA: Diagnosis not present

## 2023-03-04 DIAGNOSIS — Z681 Body mass index (BMI) 19 or less, adult: Secondary | ICD-10-CM | POA: Diagnosis not present

## 2023-03-04 DIAGNOSIS — K649 Unspecified hemorrhoids: Secondary | ICD-10-CM | POA: Diagnosis not present

## 2023-03-04 DIAGNOSIS — I10 Essential (primary) hypertension: Secondary | ICD-10-CM | POA: Diagnosis not present

## 2023-03-04 DIAGNOSIS — M5441 Lumbago with sciatica, right side: Secondary | ICD-10-CM | POA: Diagnosis not present

## 2023-03-08 DIAGNOSIS — D123 Benign neoplasm of transverse colon: Secondary | ICD-10-CM | POA: Diagnosis not present

## 2023-03-08 DIAGNOSIS — K5730 Diverticulosis of large intestine without perforation or abscess without bleeding: Secondary | ICD-10-CM | POA: Diagnosis not present

## 2023-03-08 DIAGNOSIS — Z1211 Encounter for screening for malignant neoplasm of colon: Secondary | ICD-10-CM | POA: Diagnosis not present

## 2023-03-08 DIAGNOSIS — K635 Polyp of colon: Secondary | ICD-10-CM | POA: Diagnosis not present

## 2023-03-08 DIAGNOSIS — K6389 Other specified diseases of intestine: Secondary | ICD-10-CM | POA: Diagnosis not present

## 2023-03-23 ENCOUNTER — Observation Stay (HOSPITAL_COMMUNITY): Payer: 59

## 2023-03-23 ENCOUNTER — Encounter (HOSPITAL_COMMUNITY): Payer: Self-pay | Admitting: Emergency Medicine

## 2023-03-23 ENCOUNTER — Other Ambulatory Visit: Payer: Self-pay

## 2023-03-23 ENCOUNTER — Inpatient Hospital Stay (HOSPITAL_COMMUNITY): Payer: 59

## 2023-03-23 ENCOUNTER — Inpatient Hospital Stay (HOSPITAL_COMMUNITY)
Admission: EM | Admit: 2023-03-23 | Discharge: 2023-03-29 | DRG: 674 | Disposition: A | Discharge status: Home / Self Care | Payer: 59 | Attending: Internal Medicine | Admitting: Internal Medicine

## 2023-03-23 ENCOUNTER — Emergency Department (HOSPITAL_COMMUNITY): Payer: 59

## 2023-03-23 DIAGNOSIS — Z7984 Long term (current) use of oral hypoglycemic drugs: Secondary | ICD-10-CM

## 2023-03-23 DIAGNOSIS — E119 Type 2 diabetes mellitus without complications: Secondary | ICD-10-CM | POA: Diagnosis present

## 2023-03-23 DIAGNOSIS — D539 Nutritional anemia, unspecified: Secondary | ICD-10-CM | POA: Diagnosis not present

## 2023-03-23 DIAGNOSIS — Y846 Urinary catheterization as the cause of abnormal reaction of the patient, or of later complication, without mention of misadventure at the time of the procedure: Secondary | ICD-10-CM | POA: Diagnosis present

## 2023-03-23 DIAGNOSIS — N170 Acute kidney failure with tubular necrosis: Secondary | ICD-10-CM | POA: Diagnosis not present

## 2023-03-23 DIAGNOSIS — N429 Disorder of prostate, unspecified: Secondary | ICD-10-CM

## 2023-03-23 DIAGNOSIS — N132 Hydronephrosis with renal and ureteral calculous obstruction: Secondary | ICD-10-CM | POA: Diagnosis present

## 2023-03-23 DIAGNOSIS — D509 Iron deficiency anemia, unspecified: Secondary | ICD-10-CM | POA: Diagnosis not present

## 2023-03-23 DIAGNOSIS — N1330 Unspecified hydronephrosis: Secondary | ICD-10-CM

## 2023-03-23 DIAGNOSIS — I10 Essential (primary) hypertension: Secondary | ICD-10-CM | POA: Insufficient documentation

## 2023-03-23 DIAGNOSIS — N4289 Other specified disorders of prostate: Secondary | ICD-10-CM | POA: Diagnosis not present

## 2023-03-23 DIAGNOSIS — M4804 Spinal stenosis, thoracic region: Secondary | ICD-10-CM | POA: Diagnosis not present

## 2023-03-23 DIAGNOSIS — E785 Hyperlipidemia, unspecified: Secondary | ICD-10-CM | POA: Diagnosis present

## 2023-03-23 DIAGNOSIS — N320 Bladder-neck obstruction: Secondary | ICD-10-CM | POA: Diagnosis not present

## 2023-03-23 DIAGNOSIS — D638 Anemia in other chronic diseases classified elsewhere: Secondary | ICD-10-CM | POA: Diagnosis present

## 2023-03-23 DIAGNOSIS — K5900 Constipation, unspecified: Secondary | ICD-10-CM | POA: Diagnosis not present

## 2023-03-23 DIAGNOSIS — E875 Hyperkalemia: Secondary | ICD-10-CM | POA: Diagnosis not present

## 2023-03-23 DIAGNOSIS — N179 Acute kidney failure, unspecified: Principal | ICD-10-CM | POA: Diagnosis present

## 2023-03-23 DIAGNOSIS — N1339 Other hydronephrosis: Secondary | ICD-10-CM | POA: Diagnosis not present

## 2023-03-23 DIAGNOSIS — R338 Other retention of urine: Secondary | ICD-10-CM | POA: Diagnosis present

## 2023-03-23 DIAGNOSIS — C61 Malignant neoplasm of prostate: Secondary | ICD-10-CM | POA: Diagnosis not present

## 2023-03-23 DIAGNOSIS — E872 Acidosis, unspecified: Secondary | ICD-10-CM | POA: Diagnosis not present

## 2023-03-23 DIAGNOSIS — G9589 Other specified diseases of spinal cord: Secondary | ICD-10-CM | POA: Diagnosis not present

## 2023-03-23 DIAGNOSIS — N401 Enlarged prostate with lower urinary tract symptoms: Secondary | ICD-10-CM | POA: Diagnosis not present

## 2023-03-23 DIAGNOSIS — D494 Neoplasm of unspecified behavior of bladder: Secondary | ICD-10-CM | POA: Diagnosis not present

## 2023-03-23 DIAGNOSIS — R531 Weakness: Secondary | ICD-10-CM | POA: Diagnosis not present

## 2023-03-23 DIAGNOSIS — N138 Other obstructive and reflux uropathy: Secondary | ICD-10-CM | POA: Diagnosis not present

## 2023-03-23 DIAGNOSIS — G8929 Other chronic pain: Secondary | ICD-10-CM | POA: Diagnosis present

## 2023-03-23 DIAGNOSIS — C7951 Secondary malignant neoplasm of bone: Secondary | ICD-10-CM | POA: Diagnosis not present

## 2023-03-23 DIAGNOSIS — D434 Neoplasm of uncertain behavior of spinal cord: Secondary | ICD-10-CM | POA: Diagnosis not present

## 2023-03-23 DIAGNOSIS — Z79899 Other long term (current) drug therapy: Secondary | ICD-10-CM

## 2023-03-23 DIAGNOSIS — R310 Gross hematuria: Secondary | ICD-10-CM | POA: Diagnosis not present

## 2023-03-23 DIAGNOSIS — M898X8 Other specified disorders of bone, other site: Secondary | ICD-10-CM | POA: Diagnosis not present

## 2023-03-23 DIAGNOSIS — E871 Hypo-osmolality and hyponatremia: Secondary | ICD-10-CM | POA: Diagnosis not present

## 2023-03-23 DIAGNOSIS — M47812 Spondylosis without myelopathy or radiculopathy, cervical region: Secondary | ICD-10-CM | POA: Diagnosis not present

## 2023-03-23 DIAGNOSIS — N134 Hydroureter: Secondary | ICD-10-CM | POA: Diagnosis not present

## 2023-03-23 DIAGNOSIS — C801 Malignant (primary) neoplasm, unspecified: Secondary | ICD-10-CM | POA: Diagnosis not present

## 2023-03-23 LAB — URINALYSIS, ROUTINE W REFLEX MICROSCOPIC
Bacteria, UA: NONE SEEN
Bilirubin Urine: NEGATIVE
Glucose, UA: NEGATIVE mg/dL
Hgb urine dipstick: NEGATIVE
Ketones, ur: NEGATIVE mg/dL
Leukocytes,Ua: NEGATIVE
Nitrite: NEGATIVE
Protein, ur: NEGATIVE mg/dL
Specific Gravity, Urine: 1.008 (ref 1.005–1.030)
pH: 6 (ref 5.0–8.0)

## 2023-03-23 LAB — HEPATIC FUNCTION PANEL
ALT: 10 U/L (ref 0–44)
AST: 18 U/L (ref 15–41)
Albumin: 2.7 g/dL — ABNORMAL LOW (ref 3.5–5.0)
Alkaline Phosphatase: 65 U/L (ref 38–126)
Bilirubin, Direct: <0.1 mg/dL (ref 0.0–0.2)
Total Bilirubin: 0.5 mg/dL (ref 0.3–1.2)
Total Protein: 7.1 g/dL (ref 6.5–8.1)

## 2023-03-23 LAB — BASIC METABOLIC PANEL
Anion gap: 10 (ref 5–15)
BUN: 23 mg/dL (ref 8–23)
CO2: 17 mmol/L — ABNORMAL LOW (ref 22–32)
Calcium: 9.4 mg/dL (ref 8.9–10.3)
Chloride: 106 mmol/L (ref 98–111)
Creatinine, Ser: 3.33 mg/dL — ABNORMAL HIGH (ref 0.61–1.24)
GFR, Estimated: 20 mL/min — ABNORMAL LOW (ref >60)
Glucose, Bld: 100 mg/dL — ABNORMAL HIGH (ref 70–99)
Potassium: 6.6 mmol/L (ref 3.5–5.1)
Sodium: 133 mmol/L — ABNORMAL LOW (ref 135–145)

## 2023-03-23 LAB — CBG MONITORING, ED: Glucose-Capillary: 85 mg/dL (ref 70–99)

## 2023-03-23 LAB — PREPARE RBC (CROSSMATCH)

## 2023-03-23 LAB — PSA: Prostatic Specific Antigen: 524.09 ng/mL — ABNORMAL HIGH (ref 0.00–4.00)

## 2023-03-23 LAB — RENAL FUNCTION PANEL
Albumin: 2.7 g/dL — ABNORMAL LOW (ref 3.5–5.0)
Anion gap: 9 (ref 5–15)
BUN: 25 mg/dL — ABNORMAL HIGH (ref 8–23)
CO2: 18 mmol/L — ABNORMAL LOW (ref 22–32)
Calcium: 8.7 mg/dL — ABNORMAL LOW (ref 8.9–10.3)
Chloride: 107 mmol/L (ref 98–111)
Creatinine, Ser: 3.05 mg/dL — ABNORMAL HIGH (ref 0.61–1.24)
GFR, Estimated: 22 mL/min — ABNORMAL LOW (ref >60)
Glucose, Bld: 135 mg/dL — ABNORMAL HIGH (ref 70–99)
Phosphorus: 4.6 mg/dL (ref 2.5–4.6)
Potassium: 5.4 mmol/L — ABNORMAL HIGH (ref 3.5–5.1)
Sodium: 134 mmol/L — ABNORMAL LOW (ref 135–145)

## 2023-03-23 LAB — FERRITIN: Ferritin: 129 ng/mL (ref 24–336)

## 2023-03-23 LAB — IRON AND TIBC
Iron: 46 ug/dL (ref 45–182)
Saturation Ratios: 17 % — ABNORMAL LOW (ref 17.9–39.5)
TIBC: 269 ug/dL (ref 250–450)
UIBC: 223 ug/dL

## 2023-03-23 LAB — TYPE AND SCREEN
Antibody Screen: NEGATIVE
Unit division: 0

## 2023-03-23 LAB — CBC
HCT: 18.7 % — ABNORMAL LOW (ref 39.0–52.0)
Hemoglobin: 5.9 g/dL — CL (ref 13.0–17.0)
MCH: 21.5 pg — ABNORMAL LOW (ref 26.0–34.0)
MCHC: 31.6 g/dL (ref 30.0–36.0)
MCV: 68.2 fL — ABNORMAL LOW (ref 80.0–100.0)
Platelets: 411 10*3/uL — ABNORMAL HIGH (ref 150–400)
RBC: 2.74 MIL/uL — ABNORMAL LOW (ref 4.22–5.81)
RDW: 17.8 % — ABNORMAL HIGH (ref 11.5–15.5)
WBC: 9.8 10*3/uL (ref 4.0–10.5)
nRBC: 0 % (ref 0.0–0.2)

## 2023-03-23 LAB — ABO/RH: ABO/RH(D): B POS

## 2023-03-23 LAB — GLUCOSE, CAPILLARY
Glucose-Capillary: 117 mg/dL — ABNORMAL HIGH (ref 70–99)
Glucose-Capillary: 125 mg/dL — ABNORMAL HIGH (ref 70–99)

## 2023-03-23 LAB — BPAM RBC
Blood Product Expiration Date: 202406262359
Blood Product Expiration Date: 202406262359
ISSUE DATE / TIME: 202406051510

## 2023-03-23 LAB — PROTIME-INR
INR: 1.3 — ABNORMAL HIGH (ref 0.8–1.2)
Prothrombin Time: 16.1 seconds — ABNORMAL HIGH (ref 11.4–15.2)

## 2023-03-23 LAB — CK: Total CK: 123 U/L (ref 49–397)

## 2023-03-23 MED ORDER — GADOBUTROL 1 MMOL/ML IV SOLN
8.0000 mL | Freq: Once | INTRAVENOUS | Status: AC | PRN
  Administered 2023-03-23: 8 mL via INTRAVENOUS

## 2023-03-23 MED ORDER — HYDROMORPHONE HCL 1 MG/ML IJ SOLN
0.5000 mg | INTRAMUSCULAR | Status: DC | PRN
  Administered 2023-03-23 (×2): 0.5 mg via INTRAVENOUS
  Filled 2023-03-23: qty 0.5
  Filled 2023-03-23: qty 1

## 2023-03-23 MED ORDER — DEXTROSE 50 % IV SOLN
1.0000 | Freq: Once | INTRAVENOUS | Status: AC
  Administered 2023-03-23: 50 mL via INTRAVENOUS
  Filled 2023-03-23: qty 50

## 2023-03-23 MED ORDER — SODIUM CHLORIDE 0.45 % IV SOLN: INTRAVENOUS | Status: DC

## 2023-03-23 MED ORDER — ATORVASTATIN CALCIUM 40 MG PO TABS
40.0000 mg | ORAL_TABLET | Freq: Every day | ORAL | Status: DC
  Administered 2023-03-23 – 2023-03-28 (×6): 40 mg via ORAL
  Filled 2023-03-23 (×6): qty 1

## 2023-03-23 MED ORDER — INSULIN ASPART 100 UNIT/ML IJ SOLN
5.0000 [IU] | Freq: Once | INTRAMUSCULAR | Status: AC
  Administered 2023-03-23: 5 [IU] via INTRAVENOUS

## 2023-03-23 MED ORDER — SODIUM BICARBONATE 8.4 % IV SOLN
50.0000 meq | Freq: Once | INTRAVENOUS | Status: AC
  Administered 2023-03-23: 50 meq via INTRAVENOUS
  Filled 2023-03-23: qty 50

## 2023-03-23 MED ORDER — ACETAMINOPHEN 325 MG PO TABS: 650.0000 mg | ORAL_TABLET | Freq: Four times a day (QID) | ORAL | Status: DC | PRN

## 2023-03-23 MED ORDER — HYDROMORPHONE HCL 1 MG/ML IJ SOLN
1.0000 mg | INTRAMUSCULAR | Status: DC | PRN
  Administered 2023-03-24 – 2023-03-28 (×11): 1 mg via INTRAVENOUS
  Filled 2023-03-23 (×14): qty 1

## 2023-03-23 MED ORDER — SODIUM CHLORIDE 0.9% IV SOLUTION: Freq: Once | INTRAVENOUS | Status: AC

## 2023-03-23 MED ORDER — ACETAMINOPHEN 500 MG PO TABS
1000.0000 mg | ORAL_TABLET | Freq: Four times a day (QID) | ORAL | Status: DC
  Administered 2023-03-23 – 2023-03-29 (×21): 1000 mg via ORAL
  Filled 2023-03-23 (×23): qty 2

## 2023-03-23 MED ORDER — ALBUTEROL SULFATE (2.5 MG/3ML) 0.083% IN NEBU
10.0000 mg | INHALATION_SOLUTION | Freq: Once | RESPIRATORY_TRACT | Status: AC
  Administered 2023-03-23: 10 mg via RESPIRATORY_TRACT
  Filled 2023-03-23: qty 12

## 2023-03-23 MED ORDER — AMLODIPINE BESYLATE 10 MG PO TABS
10.0000 mg | ORAL_TABLET | Freq: Every day | ORAL | Status: DC
  Administered 2023-03-23 – 2023-03-29 (×7): 10 mg via ORAL
  Filled 2023-03-23 (×6): qty 1
  Filled 2023-03-23: qty 2

## 2023-03-23 MED ORDER — SODIUM ZIRCONIUM CYCLOSILICATE 5 G PO PACK
5.0000 g | PACK | Freq: Once | ORAL | Status: AC
  Administered 2023-03-24: 5 g via ORAL
  Filled 2023-03-23: qty 1

## 2023-03-23 MED ORDER — CALCIUM GLUCONATE-NACL 1-0.675 GM/50ML-% IV SOLN
1.0000 g | Freq: Once | INTRAVENOUS | Status: AC
  Administered 2023-03-23: 1000 mg via INTRAVENOUS
  Filled 2023-03-23: qty 50

## 2023-03-23 MED ORDER — CARVEDILOL 25 MG PO TABS
25.0000 mg | ORAL_TABLET | Freq: Two times a day (BID) | ORAL | Status: DC
  Administered 2023-03-23 – 2023-03-29 (×12): 25 mg via ORAL
  Filled 2023-03-23 (×12): qty 1

## 2023-03-23 MED ORDER — ACETAMINOPHEN 650 MG RE SUPP: 650.0000 mg | Freq: Four times a day (QID) | RECTAL | Status: DC | PRN

## 2023-03-23 MED ORDER — HEPARIN SODIUM (PORCINE) 5000 UNIT/ML IJ SOLN: 5000.0000 [IU] | Freq: Three times a day (TID) | INTRAMUSCULAR | Status: DC

## 2023-03-23 MED ORDER — SODIUM CHLORIDE 0.9 % IV BOLUS
1000.0000 mL | Freq: Once | INTRAVENOUS | Status: AC
  Administered 2023-03-23: 1000 mL via INTRAVENOUS

## 2023-03-23 MED ORDER — HYOSCYAMINE SULFATE 0.125 MG PO TBDP
0.2500 mg | ORAL_TABLET | Freq: Four times a day (QID) | ORAL | Status: DC | PRN
  Administered 2023-03-23: 0.25 mg via SUBLINGUAL
  Filled 2023-03-23 (×2): qty 2

## 2023-03-23 MED ORDER — NEPRO/CARBSTEADY PO LIQD
237.0000 mL | Freq: Two times a day (BID) | ORAL | Status: DC
  Administered 2023-03-24 – 2023-03-29 (×9): 237 mL via ORAL

## 2023-03-23 MED ORDER — SODIUM ZIRCONIUM CYCLOSILICATE 10 G PO PACK
10.0000 g | PACK | Freq: Once | ORAL | Status: AC
  Administered 2023-03-23: 10 g via ORAL
  Filled 2023-03-23: qty 1

## 2023-03-23 NOTE — H&P (Addendum)
Date: 03/23/2023               Patient Name:  Carlos Martin MRN: [id number]  DOB: 04/17/1956 Age / Sex: 67 y.o., male   PCP: Default, Provider, MD         Medical Service: Internal Medicine Teaching Service         Attending Physician: Dr. Earl Lagos, MD      First Contact: Dr. Modena Slater, DO Pager [phone]    Second Contact: Dr. Elza Rafter, DO Pager [phone]         After Hours (After 5p/  First Contact Pager: [phone]  weekends / holidays): Second Contact Pager: [phone]   SUBJECTIVE   Chief Complaint: Back pain  History of Present Illness: Elgene Louvier is a 67 yo male HTN, HLD, and T2DM who presents to [facility] with back pain that has been ongoing for about 2 months. While the patient does a lot of heavy lifting for his job at the [employer], he notes that this pain is unrelated and he woke up with the pain suddenly one day. He went to a [facility] for this back pain a few weeks ago and was given Flexeril, which improved his pain slightly, but notes that is has gotten worse, thus he presented to the ED. The pain does not radiate anywhere, but he does have numbness/tingling in his toes.   In addition to the back pain, the patient also notes that he has had difficulty urinating for 3-4 months. He experiences urinary urgency, but has very little urine output and has to strain to urinate, noting that he does not think he completely empties his bladder. The patient also experiences nocturia and wakes up 5+ times each night to urinate, although again, he has very little urine output. He denies hematuria.  The patient also has been experiencing lower abdominal pain, but notes that since he had a foley catheter placed in the ED, his pain has improved some. He denies any nausea, diarrhea, constipation, melena, or hematochezia. His last BM was yesterday and was normal. The patient states that he had one isolated episode of vomiting this morning, but denies any  hematemesis. He also denies any fevers, chills, night sweats, chest pain, or SOB. He notes that he has lost about 15 lbs unintentionally in the last month. He is unsure why, and notes that his appetite has been normal.    ED Course: BMP notable for K of 6.6, Cr 3.33. CBC with microcytic anemia (Hb 5.9), no leukocytosis. Urinalysis unremarkable. CT renal stone study notable for urinary outlet obstruction with bilateral hydronephrosis d/t bulky prostate mass. CT also noted extensive osseous metastatic disease with suspected epidural tumor at lower thoracic spine.   Meds:  Carvedilol 25 mg daily (supposed to be BID) Cleda Daub 50 mg  Losartan 100 mg Amlodipine 10 mg Metformin 1000 mg -- 2 tablets Atorvastatin 40 mg Multivitamin Lantanoprost eye drops No outpatient medications have been marked as taking for the 03/23/23 encounter [facility] Encounter).    Past Medical History  History reviewed. No pertinent surgical history.  Past Surgical History: Colonoscopy 03/08/23: 2 polyps noted in colon, diverticula, and melanosis visualized.   Social:  Lives With: wife in [place]  Occupation: Works at [employer] center - job involves heavy lifting Support: 4 kids (ages 71, 81, 73, 40) Level of Function: independent of ADL, iADLs [facility] Sharyn Lull (sees every 3 months) Substances: No tobacco, occasional alcohol (once a month),  no other substances  Family History:  Negative for HTN, DM, cardiovascular disease No family hisotry of cancer  Allergies: Allergies as of 03/23/2023   (No Known Allergies)    Review of Systems: A complete ROS was negative except as per HPI.   OBJECTIVE:   Physical Exam: Blood pressure (!) 166/83, pulse 100-120, temperature 98.2 F (36.8 C), temperature source Oral, resp. rate 18, height 5\' 9"  (1.753 m), weight 86.2 kg, SpO2 100 %.   Constitutional: well-appearing elderly male laying in bed, in no acute distress Cardiovascular:  tachycardic rate and rhythm, no m/r/g Pulmonary/Chest: normal work of breathing on room air, lungs clear to auscultation bilaterally Abdominal: soft, slightly distended abdomen. Tenderness to palpation in suprapubic region. No CVA tenderness. MSK: normal bulk and tone, no midline vertebral tenderness to palpation. Neurological: alert & oriented x 3, no focal deficits GU: Foley catheter in place, draining pink-tinged urine Skin: warm and dry Psych: normal mood and affect  Labs: CBC    Component Value Date/Time   WBC 9.8 03/23/2023 0504   RBC 2.74 (L) 03/23/2023 0504   HGB 5.9 (LL) 03/23/2023 0504   HCT 18.7 (L) 03/23/2023 0504   PLT 411 (H) 03/23/2023 0504   MCV 68.2 (L) 03/23/2023 0504   MCH 21.5 (L) 03/23/2023 0504   MCHC 31.6 03/23/2023 0504   RDW 17.8 (H) 03/23/2023 0504   LYMPHSABS 1.6 03/17/2011 1050   MONOABS 0.4 03/17/2011 1050   EOSABS 0.1 03/17/2011 1050   BASOSABS 0.0 03/17/2011 1050     CMP     Component Value Date/Time   NA 133 (L) 03/23/2023 0504   K 6.6 (HH) 03/23/2023 0504   CL 106 03/23/2023 0504   CO2 17 (L) 03/23/2023 0504   GLUCOSE 100 (H) 03/23/2023 0504   BUN 23 03/23/2023 0504   CREATININE 3.33 (H) 03/23/2023 0504   CALCIUM 9.4 03/23/2023 0504   PROT 8.8 (H) 06/27/2013 1212   ALBUMIN 4.3 06/27/2013 1212   AST 38 (H) 06/27/2013 1212   ALT 25 06/27/2013 1212   ALKPHOS 56 06/27/2013 1212   BILITOT 0.2 (L) 06/27/2013 1212   GFRNONAA 20 (L) 03/23/2023 0504   GFRAA >90 06/27/2013 1212    Imaging: CT Renal Stone: Urinary outlet obstruction with bilateral hydronephrosis due to bulky prostate mass invading the bladder base and pelvic fat. The mass is indistinguishable from the anterior wall of the rectum.   There is extensive osseous metastatic disease with suspected epidural tumor at the lower thoracic spine.    EKG: personally reviewed my interpretation is normal sinus rhythm with peaked T waves in V3, V4. Prior EKG reviewed and largely  unchanged aside from T waves.   ASSESSMENT & PLAN:   Assessment & Plan by Problem: Principal Problem:   AKI (acute kidney injury) (HCC) Active Problems:   Hyperkalemia   Microcytic anemia   Metabolic acidosis, normal anion gap (NAG)   Essential hypertension, benign   Quency Kitzinger is a 67 y.o. person living with a history of HTN, HLD, and T2DM who presented with back pain and admitted for AKI, hyperkalemia, and acute anemia, with concerns for metastatic prostate cancer on hospital day 0  AKI Bilateral hydronephrosis Unable to see any recent labs, but in 2016 patient had normal kidney function, with Cr of 1.12, but today, Cr elevated to 3.33 with a GFR of 20. Urinalysis was unremarkable, but CT Renal stone study showed a urinary outlet obstruction with bilateral hydronephrosis due to a bulky prostate mass invading the bladder  base. The patient had a foley catheter placed in the ED with ~500 cc urine output, blood tinged. He is s/p 1L BS bolus as well.  - Appreciate nephrology recommendations - 0.45% NS 100cc/hr - Daily BMP - Avoid nephrotoxins - IV dilaudid 0.5 mg q4h PRN for severe pain from foley catheter  Hyperkalemia K elevated to 6.6, likely secondary to AKI as well as ongoing losartan and spironolactone use. EKG did show some peaked T waves in V3, V4. Patient was given calcium gluconate, insulin/D50, and lokelma in the ED. - Trend electrolytes - Hold losartan and spironolactone  Microcytic anemia Hb 5.9, MCV 68.2. He denies any evidence of GI bleed. Had recent colonoscopy last month that only showed 2 polyps in the colon and diverticula. 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 - 2u PRBC ordered - Follow up post-transfusion H&H - Daily CBC  Suspicion for metastatic prostate cancer The patient has a history of back pain, as well as unintentional weight loss of 15 lbs in the last month. As noted above, CT noted a prostate mass invading into the  bladder with evidence of metastatic disease in the spine, as well as possible epidural tumor. Urology was consulted from the ED. - Appreciate urology recs - MRI thoracic/lumbar spine pending - Follow up PSA, testosterone levels  Non anion gap metabolic acidosis  Bicarb 17, gap within normal limits at 10 on BMP. He received 1 amp of sodium bicarb in the ED. Acidosis is likely 2/2 to AKI from the bladder outlet obstruction. - Daily BMP  Hypertension Tachycardia SBP elevated in the 160s and the patient is tachycardic in the 100-120s during my evaluation. He is on losartan 100 mg daily, carvedilol 25 mg daily, spironolactone 50 mg daily, and amlodipine 10 mg daily for his BP, which is managed by Dr. Sharyn Lull as an outpatient.  - Resume amlodipine, carvedilol - Hold losartan and spiro given AKI and hyperkalemia  Type 2 diabetes The patient is on metformin 1000 mg BID at home.  - CBG monitoring with meals - Hold metformin given AKI  - Add SSI if CBGs elevated  Hyperlipidemia On atorvastatin 40 mg daily at home. Resumed home med.    Diet: Renal VTE: SCDs IVF: NS,Bolus Code: Full  Prior to Admission Living Arrangement: Home, living wife Anticipated Discharge Location: Home Barriers to Discharge: workup, medical stability  Dispo: Admit patient to Inpatient with expected length of stay greater than 2 midnights.  Signed: Rayvon Char Internal Medicine Resident PGY-2 Pager: [phone]  03/23/2023, 10:20 AM

## 2023-03-23 NOTE — ED Provider Notes (Signed)
Care assumed from Will Randall, [place] at shift change. Please see their note for further information.   Briefly: Patient presents with 8 weeks of back pain, urinary retention, bilateral leg weakness. Just had a colonoscopy a few weeks ago that showed a few polyps that were removed but not mass. No neuro deficits.   Plan: hyperkalemia at 6.6, no EKG changes. New kidney failure, hgb 5 likely due to kidney failure. Plan for admission pending CT renal study to r/o obstructive cause of CKD.  Physical Exam  BP (!) 166/83 (BP Location: Right Arm)   Pulse 81   Temp 98.2 F (36.8 C) (Oral)   Resp 18   Ht 5\' 9"  (1.753 m)   Wt 86.2 kg   SpO2 100%   BMI 28.06 kg/m   Physical Exam Vitals and nursing note reviewed.  Constitutional:      General: He is not in acute distress.    Appearance: Normal appearance. He is normal weight. He is not ill-appearing, toxic-appearing or diaphoretic.  HENT:     Head: Normocephalic and atraumatic.  Cardiovascular:     Rate and Rhythm: Normal rate.  Pulmonary:     Effort: Pulmonary effort is normal. No respiratory distress.  Musculoskeletal:        General: Normal range of motion.     Cervical back: Normal range of motion.  Skin:    General: Skin is warm and dry.  Neurological:     General: No focal deficit present.     Mental Status: He is alert.     Sensory: Sensation is intact.     Motor: Motor function is intact.     Gait: Gait is intact.     Deep Tendon Reflexes:     Reflex Scores:      Patellar reflexes are 2+ on the right side and 2+ on the left side.    Comments: Ambulatory with steady gait. 5/5 strength and sensation intact to bilateral lower extremities. DP and PT pulses intact and 2+.  Psychiatric:        Mood and Affect: Mood normal.        Behavior: Behavior normal.     Procedures  .Critical Care  Performed by: Silva Bandy, PA-C Authorized by: Silva Bandy, PA-C   Critical care provider statement:    Critical care time  (minutes):  30   Critical care start time:  03/23/2023 7:00 AM   Critical care end time:  03/23/2023 7:30 AM   Critical care was necessary to treat or prevent imminent or life-threatening deterioration of the following conditions:  Renal failure and metabolic crisis   Critical care was time spent personally by me on the following activities:  Development of treatment plan with patient or surrogate, discussions with consultants, discussions with primary provider, evaluation of patient's response to treatment, examination of patient, obtaining history from patient or surrogate, ordering and review of laboratory studies, ordering and review of radiographic studies, pulse oximetry and re-evaluation of patient's condition   Care discussed with: admitting provider     ED Course / MDM    Medical Decision Making Amount and/or Complexity of Data Reviewed Labs: ordered. Radiology: ordered.  Risk Prescription drug management.   This patient is a 67 y.o. male who presents to the ED for concern of back pain, this involves an extensive number of treatment options, and is a complaint that carries with it a high risk of complications and morbidity. The emergent differential diagnosis prior to evaluation includes,  but is not limited to,  Fracture (acute/chronic), muscle strain, cauda equina, spinal stenosis, DDD, ligamentous injury, disk herniation, metastatic cancer, vertebral osteomyelitis, kidney stone, pyelonephritis, AAA, pancreatitis, bowel obstruction, meningitis.  This is not an exhaustive differential.   Past Medical History / Co-morbidities / Social History: Hx hypertension, no pcp or lab work in 9 years  Additional history: Chart reviewed. Pertinent results include: patient with normal colonoscopy a few weeks ago, only polyps no masses.   Physical Exam: Physical exam performed. The pertinent findings include: no focal neurologic deficits  Lab Tests: Previous provider ordered labs. The  pertinent results include:  hgb 5.9, K 6.6, bicarb 17, creatinine 3.33, BUN 23   Imaging Studies: Previous provider ordered CT renal. I independently visualized and interpreted imaging which showed   Urinary outlet obstruction with bilateral hydronephrosis due to bulky prostate mass invading the bladder base and pelvic fat. The mass is indistinguishable from the anterior wall of the rectum.   There is extensive osseous metastatic disease with suspected epidural tumor at the lower thoracic spine.  I agree with the radiologist interpretation.   Cardiac Monitoring:  The patient was maintained on a cardiac monitor.  My attending physician Dr. Madilyn Hook viewed and interpreted the cardiac monitored which showed an underlying rhythm of: no STEMI. I agree with this interpretation.   Medications: Previous provider ordered lokelma, bicarb, calcium gluconate and albuterol for hyperkalemia. I ordered medication including fluids, insulin, and d50  for hyperkalemia, CKD. Reevaluation of the patient after these medicines showed that the patient improved. I have reviewed the patients home medicines and have made adjustments as needed.  Consultations Obtained: I requested consultation with the urology Dr. Laverle Patter, nephrology Dr. Ronalee Belts,  and discussed lab and imaging findings as well as pertinent plan - they recommend: admit to medicine, they will see the patient   Disposition: After consideration of the diagnostic results and the patients response to treatment, I feel that patient requires admission for new kidney failure, concern for metastatic prostate cancer on imaging. Patient is understanding and in agreement with this plan.  Discussed patient with hospitalist who agrees to admit.         Vear Clock 03/23/23 1610    Rondel Baton, MD 03/24/23 [phone]

## 2023-03-23 NOTE — ED Notes (Signed)
ED TO INPATIENT HANDOFF REPORT  ED Nurse Name and Phone #: Suleima Ohlendorf [phone]  S Name/Age/Gender Staci Righter 67 y.o. male Room/Bed: 043C/043C  Code Status   Code Status: Full Code  Home/SNF/Other Home Patient oriented to: self, place, time, and situation Is this baseline? Yes   Triage Complete: Triage complete  Chief Complaint AKI (acute kidney injury) (HCC) [N17.9]  Triage Note Pt c/o lower back pain that had been ongoing 3 weeks. No injury. Endorses heavy lifting at work. Sts new concern for bilateral leg weakness / numbness and difficulty urinating. Denies CP/SOB. Dorsalis pedis pulses equal and strong. Pt ambulatory in triage.    Allergies No Known Allergies  Level of Care/Admitting Diagnosis ED Disposition     ED Disposition  Admit   Condition  --   Comment  Hospital Area: [facility] [[id number]]  Level of Care: Telemetry Medical [104]  May admit patient to [facility] or [facility] if equivalent level of care is available:: No  Covid Evaluation: Asymptomatic - no recent exposure (last 10 days) testing not required  Diagnosis: AKI (acute kidney injury) [facility]) [[id number]]  Admitting Physician: Earl Lagos [phone]  Attending Physician: Earl Lagos [phone]  Certification:: I certify this patient will need inpatient services for at least 2 midnights  Estimated Length of Stay: 2          B Medical/Surgery History Past Medical History:  Diagnosis Date   Hypertension    History reviewed. No pertinent surgical history.   A IV Location/Drains/Wounds Patient Lines/Drains/Airways Status     Active Line/Drains/Airways     Name Placement date Placement time Site Days   Peripheral IV 03/23/23 20 G Posterior;Right Hand 03/23/23  0611  Hand  less than 1   Peripheral IV 03/23/23 20 G Left Antecubital 03/23/23  9528  Antecubital  less than 1   Urethral Catheter Raymar, RN Straight-tip 16 Fr. 03/23/23  0802  Straight-tip  less than 1             Intake/Output Last 24 hours No intake or output data in the 24 hours ending 03/23/23 1104  Labs/Imaging Results for orders placed or performed during the hospital encounter of 03/23/23 (from the past 48 hour(s))  Basic metabolic panel     Status: Abnormal   Collection Time: 03/23/23  5:04 AM  Result Value Ref Range   Sodium 133 (L) 135 - 145 mmol/L   Potassium 6.6 (HH) 3.5 - 5.1 mmol/L    Comment: CRITICAL RESULT CALLED TO, READ BACK BY AND VERIFIED WITH Kennedy Bucker, RN. [phone] 03/23/23. LPAIT   Chloride 106 98 - 111 mmol/L   CO2 17 (L) 22 - 32 mmol/L   Glucose, Bld 100 (H) 70 - 99 mg/dL    Comment: Glucose reference range applies only to samples taken after fasting for at least 8 hours.   BUN 23 8 - 23 mg/dL   Creatinine, Ser 4.40 (H) 0.61 - 1.24 mg/dL   Calcium 9.4 8.9 - 10.2 mg/dL   GFR, Estimated 20 (L) >60 mL/min    Comment: (NOTE) Calculated using the CKD-EPI Creatinine Equation (2021)    Anion gap 10 5 - 15    Comment: Performed at [facility], [address] [place], [place] [address]  CBC     Status: Abnormal   Collection Time: 03/23/23  5:04 AM  Result Value Ref Range   WBC 9.8 4.0 - 10.5 K/uL   RBC 2.74 (L) 4.22 - 5.81 MIL/uL  Hemoglobin 5.9 (LL) 13.0 - 17.0 g/dL    Comment: REPEATED TO VERIFY Reticulocyte Hemoglobin testing may be clinically indicated, consider ordering this additional test [id number] THIS CRITICAL RESULT HAS VERIFIED AND BEEN CALLED TO HANNAH JASPER RN BY SHERRY GALLOWAY ON 06 05 2024 AT 0556, AND HAS BEEN READ BACK.     HCT 18.7 (L) 39.0 - 52.0 %   MCV 68.2 (L) 80.0 - 100.0 fL   MCH 21.5 (L) 26.0 - 34.0 pg   MCHC 31.6 30.0 - 36.0 g/dL   RDW 78.2 (H) 95.6 - 21.3 %   Platelets 411 (H) 150 - 400 K/uL    Comment: REPEATED TO VERIFY   nRBC 0.0 0.0 - 0.2 %    Comment: Performed at [facility], [address], [place], [place] [address]  Type and screen [facility]     Status: None (Preliminary result)    Collection Time: 03/23/23  6:10 AM  Result Value Ref Range   ABO/RH(D) B POS    Antibody Screen NEG    Sample Expiration      03/26/2023,2359 Performed at [facility], [address] [place], [place] [address]    Unit Number [id number]    Blood Component Type RBC LR PHER1    Unit division 00    Status of Unit ALLOCATED    Transfusion Status OK TO TRANSFUSE    Crossmatch Result Compatible    Unit Number [id number]    Blood Component Type RED CELLS,LR    Unit division 00    Status of Unit ALLOCATED    Transfusion Status OK TO TRANSFUSE    Crossmatch Result Compatible   ABO/Rh     Status: None   Collection Time: 03/23/23  6:25 AM  Result Value Ref Range   ABO/RH(D)      B POS Performed at [facility], [address] [place], [place] [address]   Urinalysis, Routine w reflex microscopic -Urine, Clean Catch     Status: None   Collection Time: 03/23/23  6:28 AM  Result Value Ref Range   Color, Urine YELLOW YELLOW   APPearance CLEAR CLEAR   Specific Gravity, Urine 1.008 1.005 - 1.030   pH 6.0 5.0 - 8.0   Glucose, UA NEGATIVE NEGATIVE mg/dL   Hgb urine dipstick NEGATIVE NEGATIVE   Bilirubin Urine NEGATIVE NEGATIVE   Ketones, ur NEGATIVE NEGATIVE mg/dL   Protein, ur NEGATIVE NEGATIVE mg/dL   Nitrite NEGATIVE NEGATIVE   Leukocytes,Ua NEGATIVE NEGATIVE   RBC / HPF 0-5 0 - 5 RBC/hpf   WBC, UA 0-5 0 - 5 WBC/hpf   Bacteria, UA NONE SEEN NONE SEEN   Squamous Epithelial / HPF 0-5 0 - 5 /HPF    Comment: Performed at [facility], [address], [place], [place] [address]  CBG monitoring, ED     Status: None   Collection Time: 03/23/23  8:07 AM  Result Value Ref Range   Glucose-Capillary 85 70 - 99 mg/dL    Comment: Glucose reference range applies only to samples taken after fasting for at least 8 hours.   Comment 1 Notify RN    Comment 2 Document in Chart   Prepare RBC (crossmatch)     Status: None   Collection Time: 03/23/23  9:44 AM  Result Value  Ref Range   Order Confirmation      ORDER PROCESSED BY BLOOD BANK Performed at [facility], [address] [place], [place] [address]    CT Renal Stone Study  Result Date: 03/23/2023 CLINICAL DATA:  Abdominal/flank pain with stone suspected EXAM: CT ABDOMEN AND PELVIS WITHOUT CONTRAST TECHNIQUE: Multidetector CT imaging of the abdomen and pelvis was performed following the standard protocol without IV contrast. RADIATION DOSE REDUCTION: This exam was performed according to the departmental dose-optimization program which includes automated exposure control, adjustment of the mA and/or kV according to patient size and/or use of iterative reconstruction technique. COMPARISON:  None Available. FINDINGS: Lower chest: Posterior mediastinal nodularity centered on the affected vertebrae. Hepatobiliary: No focal liver abnormality.No evidence of biliary obstruction or stone. Pancreas: Unremarkable. Spleen: Unremarkable. Adrenals/Urinary Tract: Negative adrenals. Bilateral hydroureteronephrosis to the level of the distended urinary bladder which shows extensive masslike infiltration at its base, contiguous with severe masslike enlargement of the prostate. Stomach/Bowel: Indistinguishable fat plane between the prostate and anterior wall of the rectum. No bowel obstruction Vascular/Lymphatic: Pelvic lymphadenopathy and pelvic fat infiltration by the prostate cancer bilaterally. Nodularity at the aortic hiatus, likely adenopathy. Reproductive:Bulky masslike enlargement of the prostate invading the bladder base and pelvic fat, up to 13 cm anterior to posterior. Other: No ascites or pneumoperitoneum. Musculoskeletal: Diffuse heterogeneous spine attributed to metastatic disease. 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 Extraosseous tumor growth at least at lower thoracic levels into the posterior mediastinum, suspect epidural infiltration at T11. IMPRESSION: Urinary outlet obstruction with bilateral  hydronephrosis due to bulky prostate mass invading the bladder base and pelvic fat. The mass is indistinguishable from the anterior wall of the rectum. There is extensive osseous metastatic disease with suspected epidural tumor at the lower thoracic spine. Electronically Signed   By: Tiburcio Pea M.D.   On: 03/23/2023 07:53    Pending Labs Unresulted Labs (From admission, onward)     Start     Ordered   03/24/23 0500  HIV Antibody (routine testing w rflx)  (HIV Antibody (Routine testing w reflex) panel)  Once,   R        03/23/23 0937   03/24/23 0500  Basic metabolic panel  Tomorrow morning,   R        03/23/23 0937   03/24/23 0500  CBC  Tomorrow morning,   R        03/23/23 0937   03/23/23 1014  Ferritin  Once,   R        03/23/23 1013   03/23/23 1014  Iron and TIBC  Once,   R        03/23/23 1013   03/23/23 1012  Testosterone  Once,   R        03/23/23 1011   03/23/23 1011  PSA  ONCE - STAT,   STAT        03/23/23 1011   03/23/23 0625  Protime-INR  Once,   STAT        03/23/23 0624   03/23/23 0625  CK  Once,   URGENT        03/23/23 0624   03/23/23 0625  Hepatic function panel  Once,   URGENT        03/23/23 0624            Vitals/Pain Today's Vitals   03/23/23 0723 03/23/23 0832 03/23/23 1010 03/23/23 1030  BP: (!) 166/83  (!) 157/79 (!) 167/88  Pulse: 81  99 (!) 101  Resp: 18  18   Temp: 98.2 F (36.8 C)     TempSrc: Oral  SpO2: 100% 100% 99% 100%  Weight:      Height:      PainSc: 10-Worst pain ever       Isolation Precautions No active isolations  Medications Medications  acetaminophen (TYLENOL) tablet 650 mg (has no administration in time range)    Or  acetaminophen (TYLENOL) suppository 650 mg (has no administration in time range)  HYDROmorphone (DILAUDID) injection 0.5 mg (0.5 mg Intravenous Given 03/23/23 1042)  0.9 %  sodium chloride infusion (Manually program via Guardrails IV Fluids) (has no administration in time range)  carvedilol (COREG)  tablet 25 mg (has no administration in time range)  amLODipine (NORVASC) tablet 10 mg (10 mg Oral Given 03/23/23 1042)  atorvastatin (LIPITOR) tablet 40 mg (has no administration in time range)  hyoscyamine (ANASPAZ) disintergrating tablet 0.25 mg (0.25 mg Sublingual Given 03/23/23 1042)  0.45 % sodium chloride infusion (has no administration in time range)  sodium zirconium cyclosilicate (LOKELMA) packet 10 g (10 g Oral Given 03/23/23 0735)  calcium gluconate 1 g/ 50 mL sodium chloride IVPB (0 mg Intravenous Stopped 03/23/23 0723)  albuterol (PROVENTIL) (2.5 MG/3ML) 0.083% nebulizer solution 10 mg (10 mg Nebulization Given 03/23/23 0832)  sodium bicarbonate injection 50 mEq (50 mEq Intravenous Given 03/23/23 0657)  sodium chloride 0.9 % bolus 1,000 mL (0 mLs Intravenous Stopped 03/23/23 0934)  insulin aspart (novoLOG) injection 5 Units (5 Units Intravenous Given 03/23/23 0834)  dextrose 50 % solution 50 mL (50 mLs Intravenous Given 03/23/23 2376)    Mobility walks     Focused Assessments Cardiac Assessment Handoff:    Lab Results  Component Value Date   CKTOTAL 86 03/18/2011   CKMB 1.5 03/18/2011   TROPONINI  03/18/2011    <0.30        Due to the release kinetics of cTnI, a negative result within the first hours of the onset of symptoms does not rule out myocardial infarction with certainty. If myocardial infarction is still suspected, repeat the test at appropriate intervals. **Please note change in reference range.**   No results found for: "DDIMER" Does the Patient currently have chest pain? No    R Recommendations: See Admitting Provider Note  Report given to:   Additional Notes:

## 2023-03-23 NOTE — Progress Notes (Signed)
F/C noted to be leaking onto bed. 3cc NS instilled into balloon. Flushed with 10cc NS with immediate urine return.

## 2023-03-23 NOTE — ED Notes (Signed)
Patient transported to CT 

## 2023-03-23 NOTE — Consult Note (Addendum)
Urology Consult  Referring PA: Lurena Nida, PA-C Reason for referral: Urinary retention  Chief Complaint: Urinary retention  History of Present Illness: Carlos Martin is a 67 year old male presenting to [facility] with complaints of back pain for 8 weeks and urinary discomfort for 12 weeks.  He is native to [place] though spends most of his time in the [place].  He was accompanied by his wife and all questions were answered to their satisfaction.  For primary care [facility] and has not tracked his PSA.  Patient was found to have serum creatinine of 3.33 and CT renal stone revealed outlet obstruction and bilateral hydronephrosis 2/2 a bulky prostate mass and spanning the bladder base to the anterior wall of the rectum.  Also noted is extensive osseous metastatic disease with possible epidural tumor in the lower thoracic spine.  [facility] was consulted to speak to the urinary retention and hematuria.  Past Medical History:  Diagnosis Date   Hypertension    History reviewed. No pertinent surgical history.  Medications: I have reviewed the patient's current medications  Allergies: No Known Allergies  History reviewed. No pertinent family history. Social History:  reports that he has never smoked. He does not have any smokeless tobacco history on file. He reports that he does not drink alcohol and does not use drugs.  ROS: All systems are reviewed and negative except as noted. Nocturia-up 7-10 times per night Difficult stream,  denies sensation of incomplete emptying Frequently has urinary urgency and occasional incontinence, has also experienced incontinence when standing  Physical Exam:  Vital signs in last 24 hours: Temp:  [98.2 F (36.8 C)-98.3 F (36.8 C)] 98.2 F (36.8 C) (06/05 0723) Pulse Rate:  [81-101] 101 (06/05 1030) Resp:  [17-20] 18 (06/05 1010) BP: (157-169)/(77-88) 167/88 (06/05 1030) SpO2:  [99 %-100 %] 100 % (06/05 1030) Weight:   [86.2 kg] 86.2 kg (06/05 0456)  Cardiovascular: Skin warm; not flushed Respiratory: Breaths quiet; no shortness of breath Abdomen: No masses, soft, no guarding or rebound Neurological: Normal sensation to touch Musculoskeletal: Normal motor function arms and legs Skin: No rashes Genitourinary: Foley catheter in place draining lightly blood-tinged urine.  Relatively frequent bladder spasms  Laboratory Data:  Results for orders placed or performed during the hospital encounter of 03/23/23 (from the past 72 hour(s))  Basic metabolic panel     Status: Abnormal   Collection Time: 03/23/23  5:04 AM  Result Value Ref Range   Sodium 133 (L) 135 - 145 mmol/L   Potassium 6.6 (HH) 3.5 - 5.1 mmol/L    Comment: CRITICAL RESULT CALLED TO, READ BACK BY AND VERIFIED WITH Kennedy Bucker, RN. [phone] 03/23/23. LPAIT   Chloride 106 98 - 111 mmol/L   CO2 17 (L) 22 - 32 mmol/L   Glucose, Bld 100 (H) 70 - 99 mg/dL    Comment: Glucose reference range applies only to samples taken after fasting for at least 8 hours.   BUN 23 8 - 23 mg/dL   Creatinine, Ser 9.60 (H) 0.61 - 1.24 mg/dL   Calcium 9.4 8.9 - 45.4 mg/dL   GFR, Estimated 20 (L) >60 mL/min    Comment: (NOTE) Calculated using the CKD-EPI Creatinine Equation (2021)    Anion gap 10 5 - 15    Comment: Performed at [facility], [address] [place], [place] [address]  CBC     Status: Abnormal   Collection Time: 03/23/23  5:04 AM  Result  Value Ref Range   WBC 9.8 4.0 - 10.5 K/uL   RBC 2.74 (L) 4.22 - 5.81 MIL/uL   Hemoglobin 5.9 (LL) 13.0 - 17.0 g/dL    Comment: REPEATED TO VERIFY Reticulocyte Hemoglobin testing may be clinically indicated, consider ordering this additional test [id number] THIS CRITICAL RESULT HAS VERIFIED AND BEEN CALLED TO HANNAH JASPER RN BY SHERRY GALLOWAY ON 06 05 2024 AT 0556, AND HAS BEEN READ BACK.     HCT 18.7 (L) 39.0 - 52.0 %   MCV 68.2 (L) 80.0 - 100.0 fL   MCH 21.5 (L) 26.0 - 34.0 pg   MCHC 31.6 30.0 - 36.0 g/dL    RDW 54.0 (H) 98.1 - 15.5 %   Platelets 411 (H) 150 - 400 K/uL    Comment: REPEATED TO VERIFY   nRBC 0.0 0.0 - 0.2 %    Comment: Performed at [facility], [address], [place], [place] [address]  Type and screen [facility]     Status: None (Preliminary result)   Collection Time: 03/23/23  6:10 AM  Result Value Ref Range   ABO/RH(D) B POS    Antibody Screen NEG    Sample Expiration      03/26/2023,2359 Performed at [facility], [address], [place], [place] [address]    Unit Number [id number]    Blood Component Type RBC LR PHER1    Unit division 00    Status of Unit ALLOCATED    Transfusion Status OK TO TRANSFUSE    Crossmatch Result Compatible    Unit Number [id number]    Blood Component Type RED CELLS,LR    Unit division 00    Status of Unit ALLOCATED    Transfusion Status OK TO TRANSFUSE    Crossmatch Result Compatible   ABO/Rh     Status: None   Collection Time: 03/23/23  6:25 AM  Result Value Ref Range   ABO/RH(D)      B POS Performed at [facility], [address] [place], [place] [address]   Urinalysis, Routine w reflex microscopic -Urine, Clean Catch     Status: None   Collection Time: 03/23/23  6:28 AM  Result Value Ref Range   Color, Urine YELLOW YELLOW   APPearance CLEAR CLEAR   Specific Gravity, Urine 1.008 1.005 - 1.030   pH 6.0 5.0 - 8.0   Glucose, UA NEGATIVE NEGATIVE mg/dL   Hgb urine dipstick NEGATIVE NEGATIVE   Bilirubin Urine NEGATIVE NEGATIVE   Ketones, ur NEGATIVE NEGATIVE mg/dL   Protein, ur NEGATIVE NEGATIVE mg/dL   Nitrite NEGATIVE NEGATIVE   Leukocytes,Ua NEGATIVE NEGATIVE   RBC / HPF 0-5 0 - 5 RBC/hpf   WBC, UA 0-5 0 - 5 WBC/hpf   Bacteria, UA NONE SEEN NONE SEEN   Squamous Epithelial / HPF 0-5 0 - 5 /HPF    Comment: Performed at [facility], [address] [place], [place] [address]  CBG monitoring, ED     Status: None   Collection Time: 03/23/23  8:07 AM  Result Value Ref Range    Glucose-Capillary 85 70 - 99 mg/dL    Comment: Glucose reference range applies only to samples taken after fasting for at least 8 hours.   Comment 1 Notify RN    Comment 2 Document in Chart   Prepare RBC (crossmatch)     Status: None   Collection Time: 03/23/23  9:44 AM  Result Value Ref Range  Order Confirmation      ORDER PROCESSED BY BLOOD BANK Performed at [facility], [address], [place], [place] [address]    No results found for this or any previous visit (from the past 240 hour(s)). Creatinine: Recent Labs    03/23/23 0504  CREATININE 3.33*    Imaging: See report/chart  Large bladder mass spanning from the base of the bladder through the anterior wall of the rectum.  Concerning for metastatic disease into the lower thoracic spine  Assessment/Plan:   #Urinary retention #Hematuria #Prostate Cancer  Urinary retention secondary to bladder mass/prostate a megaly/prostate cancer.  The amount of hematuria represents minor trauma on placement and is relatively unremarkable.  I was able to hand irrigate the catheter quantitatively and qualitatively without any clot material returned.  Discussed with the family that this likely represented a malignancy and reviewed initiation of androgen deprivation therapy and side effects. They are amenable to ADT.  Pending PSA and testosterone levels.  Would benefit from bone scan and biopsy of most accessible lesion as soon as possible, then oncology consult.  Expressed that family will need to follow-up with [place] closely and they are amenable to establishing with our practice.  Foley to stay in place at least 1 week before TOV.   Urology will follow along peripherally for the time being.  # Bilateral hydronephrosis Secondary to obstructive uropathy.  Can collect renal ultrasound in 2-3 days to reassess for resolution of hydronephrosis.  #Hyperkalemia #AKI  K+ of 6.6 and serum creatinine of 3.33.   Nephrology following.   Likely secondary to obstructive uropathy and will resolve after catheter placement.  Scherrie Bateman Andrik Sandt 03/23/2023, 11:22 AM  Pager: [phone]

## 2023-03-23 NOTE — Plan of Care (Signed)

## 2023-03-23 NOTE — ED Notes (Signed)
Respiratory called about albuterol treatment administration

## 2023-03-23 NOTE — Consult Note (Signed)
[facility] Renal Consultation Note  Requesting MD: EDP Indication for Consultation: AKI  HPI:  Carlos Martin is a 67 y.o. male with PMH HTN, HLD, T2DM who presented to [facility] ED with progression of back pain for 1 day. The pain has been present for 8 weeks total and he has been going to urgent care for evaluation thus far. He utilizes [facility] for urgent medical needs and sees Dr. Sharyn Lull with cardiology for BP management; he also cares for Mr. Wyre's diabetes needs. He is compliant with OP medications, but has not taken them yet today: metformin, multivitamin, spironolactone, losartan amlodipine, carvedilol.   Patient states that over the last 12 weeks he has noticed difficulty with urination. Over the last several days he has also had increased fullness and pressure in his abdomen. He denies difficulty with defecation, stating that he normally has a BM when he urinates. As above, his back pain began about 8 weeks ago. He does a lot of heavy lifting with his job.  He denies fevers, chills, malaise, dizziness, headache, dyspnea, or chest pain. He notes about a 15 lb weight loss in the last 1 month. He is easily fatigued with exertion. He did vomit this morning, had not eaten, but this is the first occurrence. He has had normal appetite and PO intake.  He is up to date on colonoscopies, most recent 03/08/2023, which was largely unremarkable. He has not undergone prostate cancer screening.  In ED labs remarkable for Na 133, K 6.6, bicarbonate 17, BUN/Cr 23/3.33, GFR 20, microcytic anemia with Hgb 5.9 and MCV 68.2, bland UA. CT renal stone study concerning for urinary outlet obstruction with bilateral hydronephrosis due to bulk prostate mass invading the bladder base and pelvic fat; mass is indistinguishable from the anterior wall of the rectum; extensive osseous metastatic disease with suspected epidural tumor at the lower thoracic spine. Nephrology was consulted due to  AKI.  PMH: HTN T2DM HLD  PSH: History reviewed. No pertinent surgical history.  Family Hx: Denies family history of heart disease, CVA, diabetes, cancer.   Social History: Lives with wife in [place]. 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 Independent in ADLs, IADLs. Denies tobacco or other substance use, socially drinks alcohol.  Allergies: No Known Allergies  Medications: Prior to Admission medications   Medication Sig Start Date End Date Taking? Authorizing Provider  amLODipine-olmesartan (AZOR) 10-40 MG per tablet Take 1 tablet by mouth daily.    [provider]  carvedilol (COREG) 25 MG tablet Take 25 mg by mouth daily.    [provider]  diazepam (VALIUM) 5 MG tablet Take 1 tablet (5 mg total) by mouth 2 (two) times daily. 06/27/13   Lurene Shadow, PA-C  hydroxypropyl methylcellulose (ISOPTO TEARS) 2.5 % ophthalmic solution Place 1 drop into both eyes 4 (four) times daily as needed (dry eyes).    [provider]  lisinopril (PRINIVIL,ZESTRIL) 40 MG tablet Take 40 mg by mouth daily.    [provider]  Multiple Vitamin (MULTIVITAMIN WITH MINERALS) TABS tablet Take 1 tablet by mouth daily.    [provider]  oxyCODONE-acetaminophen (PERCOCET/ROXICET) 5-325 MG per tablet Take 1-2 pills every 4-6 hours as needed for pain. 06/27/13   Lurene Shadow, PA-C  spironolactone (ALDACTONE) 25 MG tablet Take 25 mg by mouth daily.    [provider]    I have reviewed the patient's current medications.  Labs:  Results for orders placed or performed  during the hospital encounter of 03/23/23 (from the past 48 hour(s))  Basic metabolic panel     Status: Abnormal   Collection Time: 03/23/23  5:04 AM  Result Value Ref Range   Sodium 133 (L) 135 - 145 mmol/L   Potassium 6.6 (HH) 3.5 - 5.1 mmol/L    Comment: CRITICAL RESULT CALLED TO, READ BACK BY AND VERIFIED WITH Kennedy Bucker, RN. [phone] 03/23/23. LPAIT   Chloride  106 98 - 111 mmol/L   CO2 17 (L) 22 - 32 mmol/L   Glucose, Bld 100 (H) 70 - 99 mg/dL    Comment: Glucose reference range applies only to samples taken after fasting for at least 8 hours.   BUN 23 8 - 23 mg/dL   Creatinine, Ser 9.60 (H) 0.61 - 1.24 mg/dL   Calcium 9.4 8.9 - 45.4 mg/dL   GFR, Estimated 20 (L) >60 mL/min    Comment: (NOTE) Calculated using the CKD-EPI Creatinine Equation (2021)    Anion gap 10 5 - 15    Comment: Performed at [facility], [address] [place], [place] [address]  CBC     Status: Abnormal   Collection Time: 03/23/23  5:04 AM  Result Value Ref Range   WBC 9.8 4.0 - 10.5 K/uL   RBC 2.74 (L) 4.22 - 5.81 MIL/uL   Hemoglobin 5.9 (LL) 13.0 - 17.0 g/dL    Comment: REPEATED TO VERIFY Reticulocyte Hemoglobin testing may be clinically indicated, consider ordering this additional test [id number] THIS CRITICAL RESULT HAS VERIFIED AND BEEN CALLED TO HANNAH JASPER RN BY SHERRY GALLOWAY ON 06 05 2024 AT 0556, AND HAS BEEN READ BACK.     HCT 18.7 (L) 39.0 - 52.0 %   MCV 68.2 (L) 80.0 - 100.0 fL   MCH 21.5 (L) 26.0 - 34.0 pg   MCHC 31.6 30.0 - 36.0 g/dL   RDW 62.1 (H) 30.8 - 65.7 %   Platelets 411 (H) 150 - 400 K/uL    Comment: REPEATED TO VERIFY   nRBC 0.0 0.0 - 0.2 %    Comment: Performed at [facility], [address], [place], [place] [address]  Type and screen [facility]     Status: None   Collection Time: 03/23/23  6:10 AM  Result Value Ref Range   ABO/RH(D) B POS    Antibody Screen NEG    Sample Expiration      03/26/2023,2359 Performed at [facility], [address], [place], [place] [address]   ABO/Rh     Status: None   Collection Time: 03/23/23  6:25 AM  Result Value Ref Range   ABO/RH(D)      B POS Performed at [facility], [address], [place], [place] [address]   Urinalysis, Routine w reflex microscopic -Urine, Clean Catch     Status: None   Collection Time: 03/23/23  6:28 AM  Result Value  Ref Range   Color, Urine YELLOW YELLOW   APPearance CLEAR CLEAR   Specific Gravity, Urine 1.008 1.005 - 1.030   pH 6.0 5.0 - 8.0   Glucose, UA NEGATIVE NEGATIVE mg/dL   Hgb urine dipstick NEGATIVE NEGATIVE   Bilirubin Urine NEGATIVE NEGATIVE   Ketones, ur NEGATIVE NEGATIVE mg/dL   Protein, ur NEGATIVE NEGATIVE mg/dL   Nitrite NEGATIVE NEGATIVE   Leukocytes,Ua NEGATIVE NEGATIVE   RBC / HPF 0-5 0 - 5 RBC/hpf   WBC, UA 0-5 0 - 5 WBC/hpf  Bacteria, UA NONE SEEN NONE SEEN   Squamous Epithelial / HPF 0-5 0 - 5 /HPF    Comment: Performed at [facility], [address] [place], [place] [address]  CBG monitoring, ED     Status: None   Collection Time: 03/23/23  8:07 AM  Result Value Ref Range   Glucose-Capillary 85 70 - 99 mg/dL    Comment: Glucose reference range applies only to samples taken after fasting for at least 8 hours.   Comment 1 Notify RN    Comment 2 Document in Chart      ROS: Pertinent items are noted in HPI.  Physical Exam: Vitals:   03/23/23 0630 03/23/23 0723  BP: (!) 169/86 (!) 166/83  Pulse: 86 81  Resp: 19 18  Temp:  98.2 F (36.8 C)  SpO2: 100% 100%     Constitutional:Appears tired, resting and receiving nebulizer treatment. In no acute distress. Cardio:Regular rate and rhythm. No murmurs, rubs, or gallops. Pulm:Clear to auscultation bilaterally. Normal work of breathing on room air. Abdomen:Soft, mildly distended, mild tenderness to palpation over suprapubic area. UE:AVWUJ catheter in place with clear yellow urine in bag; catheter with some blood. No CVA tenderness. WJX:[id number] for extremity edema. No midline vertebral tenderness, step offs, or deformities. Skin:Warm and dry. Neuro:Alert and oriented x3. No focal deficit noted. Psych:Pleasant mood and affect.  Assessment/Plan: 1. AKI: Patient presents with presumed AKI given no known history of CKD, BUN/Cr 23/3.33, GFR 20. 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 UA largely unremarkable, without blood or casts. He has foley catheter in place and reports improvement in abdominal pressure with relief of the obstruction. With foley catheter now in place, will plan to give volume resuscitation and trend BMP. Given that urine will be dilute, recommend treating with 1/2 NS 100 cc/h and repeating BMP later today. Urology consult pending. Avoid nephrotoxic medications. 2.Electrolyte derangements: BMP with hyponatremia 133 and hyperkalemia 6.6. I suspect hyponatremia is hypovolemic in nature given anemia and recent vomiting. Hyperkalemia likely 2/2 AKI, complicated by losartan and spironolactone use. He has received calcium gluconate, insulin, lokelma, and IVF since arriving to ED. Plan to trend BMP and treat derangements as indicated. 3.Non-anion gap metabolic acidosis: Bicarbonate low at 17, and received 1 amp sodium bicarbonate. Likely 2/2 AKI 2/2 bladder output obstruction. Trend BMP. 4.Hypertension/volume: Hypertensive with mild tachycardia. Clinically appears somewhat volume down. He has not taken his home BP medications. Will need volume resuscitation as above with blood given his anemia and possible fluids.  5.Anemia: Microcytic, Hgb 5.9 and MCV 68.2. No iron studies. Denied evidence of GI bleed. Suspect that this is related to what is very likely metastatic prostate cancer. Trend CBC, transfuse for Hgb <7.   Champ Mungo, DO Internal Medicine PGY-2 03/23/2023, 8:35 AM

## 2023-03-23 NOTE — ED Provider Notes (Signed)
[place] [facility] AT [facility] Provider Note   CSN: [id number] Arrival date & time: 03/23/23  0448     History  Chief Complaint  Patient presents with   Back Pain   Weakness    Carlos Martin is a 67 y.o. male.  HPI   Patient with medical history including hypertension, hyperlipidemia, diabetes, presenting with complaints of back pain.  Patient states back pain started yesterday, states having after work, states he feels it may is back does not radiate worsened with movement, improved with his rest, denies any saddle paresthesias urinary incontinence or bowel incontinency.  He states that he has been having back issues for the last 8 weeks, feels that is likely from his job as he does a lot of heavy lifting for his job.  He states that he is gone to urgent care where they just prescribed medication will go away.  Got no history of aneurysms dissections no connective tissue disorder.  He does note that prior to the back pain he has been dealing with difficulty with urination this was going on for months, he denies any dysuria hematuria states that he just feels like he cannot fully empty his bladder.    Home Medications Prior to Admission medications   Medication Sig Start Date End Date Taking? Authorizing Provider  amLODipine-olmesartan (AZOR) 10-40 MG per tablet Take 1 tablet by mouth daily.    [provider]  carvedilol (COREG) 25 MG tablet Take 25 mg by mouth daily.    [provider]  diazepam (VALIUM) 5 MG tablet Take 1 tablet (5 mg total) by mouth 2 (two) times daily. 06/27/13   Lurene Shadow, PA-C  hydroxypropyl methylcellulose (ISOPTO TEARS) 2.5 % ophthalmic solution Place 1 drop into both eyes 4 (four) times daily as needed (dry eyes).    [provider]  lisinopril (PRINIVIL,ZESTRIL) 40 MG tablet Take 40 mg by mouth daily.    [provider]  Multiple Vitamin (MULTIVITAMIN WITH MINERALS) TABS tablet Take 1 tablet  by mouth daily.    [provider]  oxyCODONE-acetaminophen (PERCOCET/ROXICET) 5-325 MG per tablet Take 1-2 pills every 4-6 hours as needed for pain. 06/27/13   Lurene Shadow, PA-C  spironolactone (ALDACTONE) 25 MG tablet Take 25 mg by mouth daily.    [provider]      Allergies    Patient has no known allergies.    Review of Systems   Review of Systems  Constitutional:  Negative for chills and fever.  Respiratory:  Negative for shortness of breath.   Cardiovascular:  Negative for chest pain.  Gastrointestinal:  Negative for abdominal pain.  Musculoskeletal:  Positive for back pain.  Neurological:  Negative for headaches.    Physical Exam Updated Vital Signs BP (!) 166/83 (BP Location: Right Arm)   Pulse 81   Temp 98.2 F (36.8 C) (Oral)   Resp 18   Ht 5\' 9"  (1.753 m)   Wt 86.2 kg   SpO2 100%   BMI 28.06 kg/m  Physical Exam Vitals and nursing note reviewed.  Constitutional:      General: He is not in acute distress.    Appearance: He is not ill-appearing.  HENT:     Head: Normocephalic and atraumatic.     Nose: No congestion.  Eyes:     Conjunctiva/sclera: Conjunctivae normal.  Cardiovascular:     Rate and Rhythm: Normal rate and regular rhythm.     Pulses: Normal pulses.  Heart sounds: No murmur heard.    No friction rub. No gallop.  Pulmonary:     Effort: No respiratory distress.     Breath sounds: No wheezing, rhonchi or rales.  Abdominal:     Palpations: Abdomen is soft.     Tenderness: There is abdominal tenderness. There is no right CVA tenderness or left CVA tenderness.     Comments: Abdomen nondistended, soft, he has noted suprapubic tenderness without guarding rebound tenderness or peritoneal sign.  Musculoskeletal:     Comments: Spine was palpated was nontender to palpation no step-off deformities noted no pelvis instability no leg shortening, patient has 2+ patellar reflexes, sensation intact to light touch, he is 2-second  capillary refill, 2+ dorsal pedal pulses, he has 5 5 strength in the lower extremities he is able without difficulty.  Patient has focalized tenderness over the muscular surrounding the lower lumbar back pain is is reproducible.  Skin:    General: Skin is warm and dry.  Neurological:     Mental Status: He is alert.  Psychiatric:        Mood and Affect: Mood normal.     ED Results / Procedures / Treatments   Labs (all labs ordered are listed, but only abnormal results are displayed) Labs Reviewed  BASIC METABOLIC PANEL - Abnormal; Notable for the following components:      Result Value   Sodium 133 (*)    Potassium 6.6 (*)    CO2 17 (*)    Glucose, Bld 100 (*)    Creatinine, Ser 3.33 (*)    GFR, Estimated 20 (*)    All other components within normal limits  CBC - Abnormal; Notable for the following components:   RBC 2.74 (*)    Hemoglobin 5.9 (*)    HCT 18.7 (*)    MCV 68.2 (*)    MCH 21.5 (*)    RDW 17.8 (*)    Platelets 411 (*)    All other components within normal limits  URINALYSIS, ROUTINE W REFLEX MICROSCOPIC  PROTIME-INR  CK  HEPATIC FUNCTION PANEL  CBG MONITORING, ED  POC OCCULT BLOOD, ED  TYPE AND SCREEN  ABO/RH    EKG EKG Interpretation  Date/Time:  Wednesday March 23 2023 05:08:40 EDT Ventricular Rate:  83 PR Interval:  148 QRS Duration: 84 QT Interval:  348 QTC Calculation: 408 R Axis:   55 Text Interpretation: Normal sinus rhythm Minimal voltage criteria for LVH, may be normal variant ( Cornell product ) Nonspecific T wave abnormality Abnormal ECG Confirmed by Tilden Fossa [phone]) on 03/23/2023 6:18:43 AM  Radiology No results found.  Procedures .Critical Care  Performed by: Carroll Sage, PA-C Authorized by: Carroll Sage, PA-C   Critical care provider statement:    Critical care time (minutes):  30   Critical care time was exclusive of:  Separately billable procedures and treating other patients   Critical care was necessary  to treat or prevent imminent or life-threatening deterioration of the following conditions:  Renal failure   Critical care was time spent personally by me on the following activities:  Development of treatment plan with patient or surrogate, discussions with consultants, evaluation of patient's response to treatment, examination of patient, ordering and review of laboratory studies, ordering and review of radiographic studies, ordering and performing treatments and interventions, pulse oximetry, re-evaluation of patient's condition and review of old charts   I assumed direction of critical care for this patient from another provider in my specialty:  no       Medications Ordered in ED Medications  sodium zirconium cyclosilicate (LOKELMA) packet 10 g (has no administration in time range)  albuterol (PROVENTIL) (2.5 MG/3ML) 0.083% nebulizer solution 10 mg (has no administration in time range)  calcium gluconate 1 g/ 50 mL sodium chloride IVPB (0 mg Intravenous Stopped 03/23/23 0723)  sodium bicarbonate injection 50 mEq (50 mEq Intravenous Given 03/23/23 1610)    ED Course/ Medical Decision Making/ A&P                             Medical Decision Making Amount and/or Complexity of Data Reviewed Labs: ordered. Radiology: ordered.  Risk Prescription drug management.   This patient presents to the ED for concern of back pain, this involves an extensive number of treatment options, and is a complaint that carries with it a high risk of complications and morbidity.  The differential diagnosis includes AAA, dissection, kidney stone, spinal equina    Additional history obtained:  Additional history obtained from wife at bedside External records from outside source obtained and reviewed including cardiology notes   Co morbidities that complicate the patient evaluation  Hypertension, hyperlipidemia diabetes  Social Determinants of Health:  Does not frequent primary care doctor    Lab  Tests:  I Ordered, and personally interpreted labs.  The pertinent results include: CBC shows microcytic anemia hemoglobin 5.9, BMP reveals potassium 6.6 CO2 17, creatinine of 3.33 UA unremarkable   Imaging Studies ordered:  I ordered imaging studies including CT renal I independently visualized and interpreted imaging which showed pending I agree with the radiologist interpretation   Cardiac Monitoring:  The patient was maintained on a cardiac monitor.  I personally viewed and interpreted the cardiac monitored which showed an underlying rhythm of: Without signs of ischemia   Medicines ordered and prescription drug management:  I ordered medication including N/A I have reviewed the patients home medicines and have made adjustments as needed  Critical Interventions:  Patient is noted hyperkalemia there is no EKG changes, blood is nonhemolyzed suspect this is a true value, will start on albuterol, calcium, bicarb and Lokelma   Reevaluation:  Presents with back pain as well as some suprapubic tenderness will obtain screening lab workup and reassess  Patient has significant lab abnormalities, microcytic anemia with hyperkalemia, unclear if this is chronic or acute, reassessed the patient he denies any history of GI bleeds, denies any dark tarry stools or bloody stools, states that he is never had any issues with his kidneys in the past.  I am concerned patient could have a possible symptomatic aneurysm due to his kidney function can obtain imaging with contrast we will send him for CT imaging for further evaluation.  Will obtain bladder scan, done initial lab workup, will provide temporizing measures for potassium  Bladder scan reveals 450 postvoid concerns laboratories could be from postrenal obstruction will apply catheter.  Consultations Obtained:  N/A    Test Considered:  N/A   Rule out I have low suspicion for spinal fracture or spinal cord abnormality is low as  presentation atypical, patient has 5 out of 5 strength, sensation intact to light touch.  It is noted that post bladder obstruction but suspicion of acute spine equina is lower at this time.  History of AAA or dissection is also low at this time his pain is focalized and reproducible, will further assess with CT imaging.  Suspicion for GI bleed is  low at this time not endorsing any melena or hematochezia, no history of GI bleeds as abdomen soft nontender.   Dispostion and problem list  Due to shift change patient handoff to Lurena Nida [facility]   Likely patient needs to be admitted, my suspicion is likely patient had acute on chronic kidney failure, possibly from bladder obstruction likely enlarged prostate kidney stone, this would explain elevated potassium as well as normocytic anemia, another possibility is possible neurological, and would consider further workup with MRI if there is not another explanation.  I recommend following up on CT scan and providing temporary measures for hyper-K, consulting with nephrology and likely admitting to medicine.  I would not start blood at this time due to his severely decreased kidney output would be concerned to place him into volume overload.              Final Clinical Impression(s) / ED Diagnoses Final diagnoses:  AKI (acute kidney injury) (HCC)  Microcytic anemia    Rx / DC Orders ED Discharge Orders     None         Carroll Sage, PA-C 03/23/23 0726    Tilden Fossa, MD 03/24/23 [phone]

## 2023-03-23 NOTE — ED Triage Notes (Signed)
Pt c/o lower back pain that had been ongoing 3 weeks. No injury. Endorses heavy lifting at work. Sts new concern for bilateral leg weakness / numbness and difficulty urinating. Denies CP/SOB. Dorsalis pedis pulses equal and strong. Pt ambulatory in triage.

## 2023-03-24 ENCOUNTER — Inpatient Hospital Stay (HOSPITAL_COMMUNITY): Payer: 59

## 2023-03-24 DIAGNOSIS — N429 Disorder of prostate, unspecified: Secondary | ICD-10-CM | POA: Diagnosis not present

## 2023-03-24 DIAGNOSIS — D638 Anemia in other chronic diseases classified elsewhere: Secondary | ICD-10-CM

## 2023-03-24 DIAGNOSIS — E119 Type 2 diabetes mellitus without complications: Secondary | ICD-10-CM

## 2023-03-24 DIAGNOSIS — E872 Acidosis, unspecified: Secondary | ICD-10-CM

## 2023-03-24 DIAGNOSIS — E875 Hyperkalemia: Secondary | ICD-10-CM

## 2023-03-24 DIAGNOSIS — N179 Acute kidney failure, unspecified: Secondary | ICD-10-CM | POA: Diagnosis not present

## 2023-03-24 DIAGNOSIS — N138 Other obstructive and reflux uropathy: Secondary | ICD-10-CM | POA: Diagnosis not present

## 2023-03-24 DIAGNOSIS — I10 Essential (primary) hypertension: Secondary | ICD-10-CM

## 2023-03-24 DIAGNOSIS — N1330 Unspecified hydronephrosis: Secondary | ICD-10-CM

## 2023-03-24 DIAGNOSIS — E785 Hyperlipidemia, unspecified: Secondary | ICD-10-CM

## 2023-03-24 DIAGNOSIS — M4804 Spinal stenosis, thoracic region: Secondary | ICD-10-CM

## 2023-03-24 DIAGNOSIS — D434 Neoplasm of uncertain behavior of spinal cord: Secondary | ICD-10-CM

## 2023-03-24 LAB — CBC
HCT: 25 % — ABNORMAL LOW (ref 39.0–52.0)
Hemoglobin: 8.2 g/dL — ABNORMAL LOW (ref 13.0–17.0)
MCH: 24 pg — ABNORMAL LOW (ref 26.0–34.0)
MCHC: 32.8 g/dL (ref 30.0–36.0)
MCV: 73.1 fL — ABNORMAL LOW (ref 80.0–100.0)
Platelets: 298 10*3/uL (ref 150–400)
RBC: 3.42 MIL/uL — ABNORMAL LOW (ref 4.22–5.81)
RDW: 20.8 % — ABNORMAL HIGH (ref 11.5–15.5)
WBC: 8.7 10*3/uL (ref 4.0–10.5)
nRBC: 0 % (ref 0.0–0.2)

## 2023-03-24 LAB — BASIC METABOLIC PANEL
Anion gap: 11 (ref 5–15)
BUN: 26 mg/dL — ABNORMAL HIGH (ref 8–23)
CO2: 16 mmol/L — ABNORMAL LOW (ref 22–32)
Calcium: 9.1 mg/dL (ref 8.9–10.3)
Chloride: 106 mmol/L (ref 98–111)
Creatinine, Ser: 2.93 mg/dL — ABNORMAL HIGH (ref 0.61–1.24)
GFR, Estimated: 23 mL/min — ABNORMAL LOW (ref >60)
Glucose, Bld: 92 mg/dL (ref 70–99)
Potassium: 5.1 mmol/L (ref 3.5–5.1)
Sodium: 133 mmol/L — ABNORMAL LOW (ref 135–145)

## 2023-03-24 LAB — HIV ANTIBODY (ROUTINE TESTING W REFLEX): HIV Screen 4th Generation wRfx: NONREACTIVE

## 2023-03-24 LAB — GLUCOSE, CAPILLARY
Glucose-Capillary: 99 mg/dL (ref 70–99)
Glucose-Capillary: 97 mg/dL (ref 70–99)
Glucose-Capillary: 124 mg/dL — ABNORMAL HIGH (ref 70–99)
Glucose-Capillary: 102 mg/dL — ABNORMAL HIGH (ref 70–99)

## 2023-03-24 LAB — BPAM RBC
ISSUE DATE / TIME: 202406051836
Unit Type and Rh: 7300
Unit Type and Rh: 7300

## 2023-03-24 LAB — RENAL FUNCTION PANEL
Albumin: 2.8 g/dL — ABNORMAL LOW (ref 3.5–5.0)
Anion gap: 11 (ref 5–15)
BUN: 24 mg/dL — ABNORMAL HIGH (ref 8–23)
CO2: 16 mmol/L — ABNORMAL LOW (ref 22–32)
Calcium: 9 mg/dL (ref 8.9–10.3)
Chloride: 106 mmol/L (ref 98–111)
Creatinine, Ser: 2.94 mg/dL — ABNORMAL HIGH (ref 0.61–1.24)
GFR, Estimated: 23 mL/min — ABNORMAL LOW (ref >60)
Glucose, Bld: 98 mg/dL (ref 70–99)
Phosphorus: 4.4 mg/dL (ref 2.5–4.6)
Potassium: 6.3 mmol/L (ref 3.5–5.1)
Sodium: 133 mmol/L — ABNORMAL LOW (ref 135–145)

## 2023-03-24 LAB — TYPE AND SCREEN
ABO/RH(D): B POS
Unit division: 0

## 2023-03-24 MED ORDER — SODIUM ZIRCONIUM CYCLOSILICATE 10 G PO PACK
10.0000 g | PACK | Freq: Two times a day (BID) | ORAL | Status: AC
  Administered 2023-03-24 – 2023-03-25 (×3): 10 g via ORAL
  Filled 2023-03-24 (×3): qty 1

## 2023-03-24 MED ORDER — TECHNETIUM TC 99M MEDRONATE IV KIT
20.0000 | PACK | Freq: Once | INTRAVENOUS | Status: AC | PRN
  Administered 2023-03-24: 21.8 via INTRAVENOUS

## 2023-03-24 MED ORDER — DEGARELIX ACETATE(240 MG DOSE) 120 MG/VIAL ~~LOC~~ SOLR
240.0000 mg | Freq: Once | SUBCUTANEOUS | Status: AC
  Administered 2023-03-24: 240 mg via SUBCUTANEOUS
  Filled 2023-03-24: qty 6

## 2023-03-24 MED ORDER — SENNOSIDES-DOCUSATE SODIUM 8.6-50 MG PO TABS
1.0000 | ORAL_TABLET | Freq: Every day | ORAL | Status: DC
  Administered 2023-03-24 – 2023-03-25 (×2): 1 via ORAL
  Filled 2023-03-24 (×2): qty 1

## 2023-03-24 MED ORDER — STERILE WATER FOR INJECTION IV SOLN
INTRAVENOUS | Status: DC
  Filled 2023-03-24 (×2): qty 150
  Filled 2023-03-24: qty 1000
  Filled 2023-03-24 (×2): qty 150
  Filled 2023-03-24: qty 1000

## 2023-03-24 MED ORDER — POLYETHYLENE GLYCOL 3350 17 G PO PACK
17.0000 g | PACK | Freq: Every day | ORAL | Status: DC
  Administered 2023-03-24 – 2023-03-27 (×4): 17 g via ORAL
  Filled 2023-03-24 (×4): qty 1

## 2023-03-24 MED ORDER — CHLORHEXIDINE GLUCONATE CLOTH 2 % EX PADS
6.0000 | MEDICATED_PAD | Freq: Every day | CUTANEOUS | Status: DC
  Administered 2023-03-24 – 2023-03-29 (×6): 6 via TOPICAL

## 2023-03-24 MED ORDER — GADOBUTROL 1 MMOL/ML IV SOLN
8.0000 mL | Freq: Once | INTRAVENOUS | Status: AC | PRN
  Administered 2023-03-24: 8 mL via INTRAVENOUS

## 2023-03-24 MED ORDER — RIVAROXABAN 10 MG PO TABS
10.0000 mg | ORAL_TABLET | Freq: Every day | ORAL | Status: DC
  Administered 2023-03-24 – 2023-03-27 (×4): 10 mg via ORAL
  Filled 2023-03-24 (×5): qty 1

## 2023-03-24 MED ORDER — SODIUM CHLORIDE 0.9 % IV SOLN
250.0000 mg | Freq: Every day | INTRAVENOUS | Status: AC
  Administered 2023-03-24 – 2023-03-26 (×3): 250 mg via INTRAVENOUS
  Filled 2023-03-24 (×3): qty 20

## 2023-03-24 NOTE — Progress Notes (Addendum)
HD#1 Subjective:   Summary: This is a 67 year old male with a past medical history of hypertension, type 2 diabetes who presents for chronic back pain urinary retention. Imaging revealed suspicious prostate mass as well as bilateral hydronephrosis.  Patient admitted for post renal AKI in the setting of obstruction secondary to suspected prostatic cancer.  Overnight Events: No acute overnight events   Patient evaluated this morning at bedside.  Patient just returned from MRI.  He states his back pain is improved.  He states his abdominal pain is improved.  He states he understands what is going on.  He reports that he talk to his GI doctor who states he did not have colon cancer.  Patient does state that he understands that this may be cancer.  I explained to him today that his back pain could be coming from bony metastasis from prostate cancer.  I think patient does have some denial about this, but does understand that this may be cancer.  Objective:  Vital signs in last 24 hours: Vitals:   03/23/23 1907 03/23/23 2129 03/23/23 2346 03/24/23 0625  BP: (!) 146/81 (!) 143/72 (!) 156/75 (!) 146/80  Pulse: 78 77 81 79  Resp: 18 17 16 16   Temp: 98.1 F (36.7 C) 98 F (36.7 C) 98.3 F (36.8 C) 98.1 F (36.7 C)  TempSrc: Oral Oral Oral Oral  SpO2: 100% 100% 99% 99%  Weight:      Height:       Supplemental O2: Room Air SpO2: 99 %   Physical Exam:  Constitutional: Resting in bed, no acute distress HENT: normocephalic atraumatic, mucous membranes moist Eyes: conjunctiva non-erythematous Cardiovascular: regular rate and rhythm, no m/r/g Pulmonary/Chest: normal work of breathing on room air, lungs clear to auscultation bilaterally Abdominal: soft, non-tender, non-distended GU: Foley draining well, with some pink-tinged urine  Filed Weights   03/23/23 0456  Weight: 86.2 kg     Intake/Output Summary (Last 24 hours) at 03/24/2023 0651 Last data filed at 03/23/2023 2129 Gross per 24  hour  Intake 680 ml  Output 1650 ml  Net -970 ml   Net IO Since Admission: -970 mL [03/24/23 0651]  Pertinent Labs:    Latest Ref Rng & Units 03/23/2023    5:04 AM 06/27/2013   12:12 PM 03/20/2011    4:20 AM  CBC  WBC 4.0 - 10.5 K/uL 9.8  6.8  5.5   Hemoglobin 13.0 - 17.0 g/dL 5.9  40.9  81.1   Hematocrit 39.0 - 52.0 % 18.7  34.4  35.7   Platelets 150 - 400 K/uL 411  193  172        Latest Ref Rng & Units 03/23/2023    9:37 PM 03/23/2023    5:04 AM 06/27/2013   12:12 PM  CMP  Glucose 70 - 99 mg/dL 914  782  956   BUN 8 - 23 mg/dL 25  23  20    Creatinine 0.61 - 1.24 mg/dL 2.13  0.86  5.78   Sodium 135 - 145 mmol/L 134  133  130   Potassium 3.5 - 5.1 mmol/L 5.4  6.6  5.6   Chloride 98 - 111 mmol/L 107  106  97   CO2 22 - 32 mmol/L 18  17  24    Calcium 8.9 - 10.3 mg/dL 8.7  9.4  46.9   Total Protein 6.5 - 8.1 g/dL 7.1   8.8   Total Bilirubin 0.3 - 1.2 mg/dL 0.5   0.2  Alkaline Phos 38 - 126 U/L 65   56   AST 15 - 41 U/L 18   38   ALT 0 - 44 U/L 10   25     Imaging: MR THORACIC SPINE W WO CONTRAST  Result Date: 03/23/2023 CLINICAL DATA:  Metastatic disease evaluation EXAM: MRI THORACIC AND LUMBAR SPINE WITHOUT AND WITH CONTRAST TECHNIQUE: Multiplanar and multiecho pulse sequences of the thoracic and lumbar spine were obtained without and with intravenous contrast. CONTRAST:  8mL GADAVIST GADOBUTROL 1 MMOL/ML IV SOLN COMPARISON:  Same day CT renal stone protocol FINDINGS: MRI THORACIC SPINE FINDINGS Alignment:  Physiologic. Vertebrae: There is diffuse osseous metastatic disease with metastatic lesions at nearly every thoracic vertebral body. There no evidence of a severe pathologic compression deformity. There is evidence of epidural extension of tumor at the T6 level (series 16, image 9), the T8 level (series 17, image 32), T9-T11 levels (series 16, image 11), T12 level (series 17, image 50). Metastatic lesions are also seen throughout the cervical spine with possible epidural extension  of tumor at the C5 level. Note these levels are only seen on the counting sequences and are incompletely assessed. Cord: Normal signal and morphology. Paraspinal and other soft tissues: Negative. Disc levels: There is severe spinal canal stenosis at the T9-T11 levels due to the presence of epidural tumor. MRI LUMBAR SPINE FINDINGS Segmentation:  Standard. Alignment:  Grade 1 anterolisthesis of L5 on S1. Vertebrae: Metastatic lesions are seen at every lumbar and visualized sacral vertebral body Conus medullaris: Extends to the L2-L3 disc space level. There is a fatty filum terminale Paraspinal and other soft tissues: See same day CT Renal Stone protocol exam for additional findings. There is an enlarged right common iliac chain lymph node, worrisome for metastatic disease. Contrast-enhancing lesions are seen in the bilateral iliac bones (series 19, image 39) Disc levels: No evidence of high-grade spinal canal stenosis no evidence of epidural spread of tumor in the lumbar spine IMPRESSION: 1. Diffuse osseous metastatic disease with metastatic lesions at nearly every cervical, thoracic, and lumbar vertebral body. 2. Epidural extension of tumor at the T6, T8, T9-T11, and T12 levels resulting in severe spinal canal stenosis at the T9-T11 levels. There may also be epidural extension of tumor at the C5 level, but this level is incompletely imaged. Consider further evaluation with a dedicated cervical spine MRI with and without contrast. 3. No evidence of epidural spread of tumor in the lumbar spine. 4. Enlarged right common iliac chain lymph node, worrisome for metastatic disease. 5. Contrast-enhancing lesions in the bilateral iliac bones, worrisome for additional sites of osseous metastatic disease. Electronically Signed   By: Lorenza Cambridge M.D.   On: 03/23/2023 14:01   MR Lumbar Spine W Wo Contrast  Result Date: 03/23/2023 CLINICAL DATA:  Metastatic disease evaluation EXAM: MRI THORACIC AND LUMBAR SPINE WITHOUT AND  WITH CONTRAST TECHNIQUE: Multiplanar and multiecho pulse sequences of the thoracic and lumbar spine were obtained without and with intravenous contrast. CONTRAST:  8mL GADAVIST GADOBUTROL 1 MMOL/ML IV SOLN COMPARISON:  Same day CT renal stone protocol FINDINGS: MRI THORACIC SPINE FINDINGS Alignment:  Physiologic. Vertebrae: There is diffuse osseous metastatic disease with metastatic lesions at nearly every thoracic vertebral body. There no evidence of a severe pathologic compression deformity. There is evidence of epidural extension of tumor at the T6 level (series 16, image 9), the T8 level (series 17, image 32), T9-T11 levels (series 16, image 11), T12 level (series 17, image 50). Metastatic lesions are  also seen throughout the cervical spine with possible epidural extension of tumor at the C5 level. Note these levels are only seen on the counting sequences and are incompletely assessed. Cord: Normal signal and morphology. Paraspinal and other soft tissues: Negative. Disc levels: There is severe spinal canal stenosis at the T9-T11 levels due to the presence of epidural tumor. MRI LUMBAR SPINE FINDINGS Segmentation:  Standard. Alignment:  Grade 1 anterolisthesis of L5 on S1. Vertebrae: Metastatic lesions are seen at every lumbar and visualized sacral vertebral body Conus medullaris: Extends to the L2-L3 disc space level. There is a fatty filum terminale Paraspinal and other soft tissues: See same day CT Renal Stone protocol exam for additional findings. There is an enlarged right common iliac chain lymph node, worrisome for metastatic disease. Contrast-enhancing lesions are seen in the bilateral iliac bones (series 19, image 39) Disc levels: No evidence of high-grade spinal canal stenosis no evidence of epidural spread of tumor in the lumbar spine IMPRESSION: 1. Diffuse osseous metastatic disease with metastatic lesions at nearly every cervical, thoracic, and lumbar vertebral body. 2. Epidural extension of tumor at  the T6, T8, T9-T11, and T12 levels resulting in severe spinal canal stenosis at the T9-T11 levels. There may also be epidural extension of tumor at the C5 level, but this level is incompletely imaged. Consider further evaluation with a dedicated cervical spine MRI with and without contrast. 3. No evidence of epidural spread of tumor in the lumbar spine. 4. Enlarged right common iliac chain lymph node, worrisome for metastatic disease. 5. Contrast-enhancing lesions in the bilateral iliac bones, worrisome for additional sites of osseous metastatic disease. Electronically Signed   By: Lorenza Cambridge M.D.   On: 03/23/2023 14:01   CT Renal Stone Study  Result Date: 03/23/2023 CLINICAL DATA:  Abdominal/flank pain with stone suspected EXAM: CT ABDOMEN AND PELVIS WITHOUT CONTRAST TECHNIQUE: Multidetector CT imaging of the abdomen and pelvis was performed following the standard protocol without IV contrast. RADIATION DOSE REDUCTION: This exam was performed according to the departmental dose-optimization program which includes automated exposure control, adjustment of the mA and/or kV according to patient size and/or use of iterative reconstruction technique. COMPARISON:  None Available. FINDINGS: Lower chest: Posterior mediastinal nodularity centered on the affected vertebrae. Hepatobiliary: No focal liver abnormality.No evidence of biliary obstruction or stone. Pancreas: Unremarkable. Spleen: Unremarkable. Adrenals/Urinary Tract: Negative adrenals. Bilateral hydroureteronephrosis to the level of the distended urinary bladder which shows extensive masslike infiltration at its base, contiguous with severe masslike enlargement of the prostate. Stomach/Bowel: Indistinguishable fat plane between the prostate and anterior wall of the rectum. No bowel obstruction Vascular/Lymphatic: Pelvic lymphadenopathy and pelvic fat infiltration by the prostate cancer bilaterally. Nodularity at the aortic hiatus, likely adenopathy.  Reproductive:Bulky masslike enlargement of the prostate invading the bladder base and pelvic fat, up to 13 cm anterior to posterior. Other: No ascites or pneumoperitoneum. Musculoskeletal: Diffuse heterogeneous spine attributed to metastatic disease. 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 Extraosseous tumor growth at least at lower thoracic levels into the posterior mediastinum, suspect epidural infiltration at T11. IMPRESSION: Urinary outlet obstruction with bilateral hydronephrosis due to bulky prostate mass invading the bladder base and pelvic fat. The mass is indistinguishable from the anterior wall of the rectum. There is extensive osseous metastatic disease with suspected epidural tumor at the lower thoracic spine. Electronically Signed   By: Tiburcio Pea M.D.   On: 03/23/2023 07:53    Assessment/Plan:   Principal Problem:   AKI (acute kidney injury) (HCC) Active  Problems:   Hyperkalemia   Microcytic anemia   Metabolic acidosis, normal anion gap (NAG)   Essential hypertension, benign   Patient Summary: This is a 67 year old male with a past medical history of hypertension, type 2 diabetes who presents for chronic back pain urinary retention. Imaging revealed suspicious prostate mass as well as bilateral hydronephrosis.  Patient admitted for post renal AKI in the setting of obstruction secondary to suspected prostate cancer.  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  Bladder is draining pink-tinged urine.  Patient reports relief of abdominal pain secondary to urinary retention.  This morning, patient states pain is improved.  On exam, there is no abdominal tenderness.  Labs showing severely elevated PSA concerning for prostate cancer.  Imaging showing pretty extensive disease throughout C-spine, T-spine, as well as iliac bones.  There is also right  iliac lymph nodes enlarged.  Testosterone pending.  Urology following recommending ADT with Deborra Medina.  Nephrology following as well.  Did reach out to urology, and they are not concerned about any obstruction above the bladder.  Will need to maintain Foley and patient will likely need TURP in the future to prevent recurrent obstruction. -Urology following -Nephrology following -Pain control with Dilaudid and Tylenol -Patient was noted to be acidotic as well as significantly hyperkalemic secondary to his AKI.  Will continue with sodium bicarb infusion -Start Firmagon 240 mg injection daily -Testosterone pending -In 48 hours obtain renal ultrasound  #Hyperkalemia Patient initially had elevated potassium yesterday.  Patient was temporized with calcium, insulin, and Lokelma.  It did come down, but then came back up to 6.3 this morning.  EKG obtained did not show any peaked T waves.  Ordered Lokelma.  Will continue to monitor electrolytes.  Hyperkalemia likely in the setting of kidney dysfunction. -Continue to monitor potassium.  Continue with sodium bicarbonate infusion.  Repeat potassium was within normal limits. -Lokelma  #Anemia of chronic disease Patient had significantly decreased hemoglobin of 5.9 on admission.  Patient has had 2 units of packed red blood cells.  Hemoglobin appropriately risen.  Iron studies did not show evidence of iron deficiency anemia. 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  Pain is well-controlled at this time.  Neurology to start Firmagon. -Continue to monitor for worsening neurological signs -Continue pain control with Dilaudid and Tylenol  #NAGMA Likely secondary to underlying kidney dysfunction.  Nephrology following started sodium bicarb infusion.  Most recent bicarb at 16. -Monitor bicarb -Continue sodium bicarb infusion per nephrology  #Severe spinal stenosis T9-T11 Patient is not complaining of any weakness at  this time.  Pain improved.  If patient starts having weakness, saddle anesthesia, or any other neurological dysfunction, will need to evaluate further. -Continue pain control with Dilaudid and Tylenol -Continue to monitor for worsening neurological symptoms  #Hypertension Blood pressure measuring well. -Continue amlodipine 10 mg daily -Continue carvedilol 25 mg twice daily -Hold home spironolactone -Hold home losartan 100 mg  #Type 2 diabetes Glucose measured 1 during hospitalization.  Hold home metformin.  #Hyperlipidemia -Continue atorvastatin 40 mg daily   Diet: Normal IVF: Sodium bicarb infusion VTE: DOAC Code: Full  Dispo: Anticipated discharge to Home in 4 days pending clinical improvement.   Modena Slater DO Internal Medicine Resident PGY-1 [phone] Please contact the on call pager after 5 pm and on weekends at [phone].

## 2023-03-24 NOTE — Progress Notes (Signed)
Subjective:  Patient states that he feels okay this morning. Walking is uncomfortable due to foley catheter and he has not had a bowel movement since Tuesday--he normally has them daily. No acute complaints.   Objective Vital signs in last 24 hours: Vitals:   03/23/23 1907 03/23/23 2129 03/23/23 2346 03/24/23 0625  BP: (!) 146/81 (!) 143/72 (!) 156/75 (!) 146/80  Pulse: 78 77 81 79  Resp: 18 17 16 16   Temp: 98.1 F (36.7 C) 98 F (36.7 C) 98.3 F (36.8 C) 98.1 F (36.7 C)  TempSrc: Oral Oral Oral Oral  SpO2: 100% 100% 99% 99%  Weight:      Height:       Weight change:   Intake/Output Summary (Last 24 hours) at 03/24/2023 3086 Last data filed at 03/23/2023 2129 Gross per 24 hour  Intake 680 ml  Output 1650 ml  Net -970 ml    Assessment/ Plan: 67 year old male with past medical history significant for hypertension, T2DM, HLD presented with several weeks of back pain, decreased urine output, seen as a consultation for the management of AKI and hyperkalemia.  1. AKI: 2/2 obstructive uropathy from metastatic prostate cancer causing urinary outlet obstruction and bilateral hydronephrosis. Foley catheter in place, has had 1.65L UOP since placement on admission yesterday. Continues to have mild but improved suprapubic discomfort. Despite relief of obstruction, renal function has not significantly improved with BUN/Cr 24/2.94 (23/3.33) and GFR 23 (20). With this low level of improvement I suspect the obstruction and subsequent renal insult is subacute-chronic rather than acute. Foley with red-tinted urine. Plan to change fluid (see below) and trend renal function. Avoid nephrotoxic medications, continue to hold home ARB and aldactone. 2.Electrolyte derangements: BMP with hyponatremia 133 and hyperkalemia 6.3. Hyperkalemia likely 2/2 AKI, complicated possibly by blood transfusion 06/05. Plan to increase lokelma to 10 g BID starting now (will have received 15 g this morning as primary team gave 5  g) and recommend  repeat BMP later this afternoon. 3.Non-anion gap metabolic acidosis: Bicarbonate persistently low at 16. Will change fluids to 125 mL/h sodium bicarbonate 150 mEq in sterile water. 4.Hypertension/volume: Remains mildly hypertensive.Home amlodipine and carvedilol restarted by primary team on admission. No concern for volume overload at this time. Per primary. 5.Anemia: In the setting of active malignancy. Improved with PRBC transfusion on admission. Microcytic, Hgb 8.2 and MCV 73.1. Iron studies with iron 46, TIBC 269, saturation 17%, ferritin 129. Total body iron deficit of 1286 mg. Will give ferrlecit 250 mg IV daily x 3 days. Trend CBC, transfuse for Hgb <7. 6. Metastatic prostate cancer: Urology following for management and recommendations. Not sure if he and his wife understand the significance of his cancer. Encouraged him to ask any questions that he may have.   Labs: Basic Metabolic Panel: Recent Labs  Lab 03/23/23 0504 03/23/23 2137 03/24/23 0816  NA 133* 134* 133*  K 6.6* 5.4* 6.3*  CL 106 107 106  CO2 17* 18* 16*  GLUCOSE 100* 135* 98  BUN 23 25* 24*  CREATININE 3.33* 3.05* 2.94*  CALCIUM 9.4 8.7* 9.0  PHOS  --  4.6 4.4   Liver Function Tests: Recent Labs  Lab 03/23/23 2137 03/24/23 0816  AST 18  --   ALT 10  --   ALKPHOS 65  --   BILITOT 0.5  --   PROT 7.1  --   ALBUMIN 2.7*  2.7* 2.8*   No results for input(s): "LIPASE", "AMYLASE" in the last 168 hours. No results for  input(s): "AMMONIA" in the last 168 hours.  CBC: Recent Labs  Lab 03/23/23 0504 03/24/23 0816  WBC 9.8 8.7  HGB 5.9* 8.2*  HCT 18.7* 25.0*  MCV 68.2* 73.1*  PLT 411* 298   Cardiac Enzymes: Recent Labs  Lab 03/23/23 2137  CKTOTAL 123   CBG: Recent Labs  Lab 03/23/23 0807 03/23/23 1655 03/23/23 2128  GLUCAP 85 117* 125*    Iron Studies:  Recent Labs    03/23/23 2137  IRON 46  TIBC 269  FERRITIN 129   Studies/Results: MR THORACIC SPINE W WO  CONTRAST  Result Date: 03/23/2023 CLINICAL DATA:  Metastatic disease evaluation EXAM: MRI THORACIC AND LUMBAR SPINE WITHOUT AND WITH CONTRAST TECHNIQUE: Multiplanar and multiecho pulse sequences of the thoracic and lumbar spine were obtained without and with intravenous contrast. CONTRAST:  8mL GADAVIST GADOBUTROL 1 MMOL/ML IV SOLN COMPARISON:  Same day CT renal stone protocol FINDINGS: MRI THORACIC SPINE FINDINGS Alignment:  Physiologic. Vertebrae: There is diffuse osseous metastatic disease with metastatic lesions at nearly every thoracic vertebral body. There no evidence of a severe pathologic compression deformity. There is evidence of epidural extension of tumor at the T6 level (series 16, image 9), the T8 level (series 17, image 32), T9-T11 levels (series 16, image 11), T12 level (series 17, image 50). Metastatic lesions are also seen throughout the cervical spine with possible epidural extension of tumor at the C5 level. Note these levels are only seen on the counting sequences and are incompletely assessed. Cord: Normal signal and morphology. Paraspinal and other soft tissues: Negative. Disc levels: There is severe spinal canal stenosis at the T9-T11 levels due to the presence of epidural tumor. MRI LUMBAR SPINE FINDINGS Segmentation:  Standard. Alignment:  Grade 1 anterolisthesis of L5 on S1. Vertebrae: Metastatic lesions are seen at every lumbar and visualized sacral vertebral body Conus medullaris: Extends to the L2-L3 disc space level. There is a fatty filum terminale Paraspinal and other soft tissues: See same day CT Renal Stone protocol exam for additional findings. There is an enlarged right common iliac chain lymph node, worrisome for metastatic disease. Contrast-enhancing lesions are seen in the bilateral iliac bones (series 19, image 39) Disc levels: No evidence of high-grade spinal canal stenosis no evidence of epidural spread of tumor in the lumbar spine IMPRESSION: 1. Diffuse osseous metastatic  disease with metastatic lesions at nearly every cervical, thoracic, and lumbar vertebral body. 2. Epidural extension of tumor at the T6, T8, T9-T11, and T12 levels resulting in severe spinal canal stenosis at the T9-T11 levels. There may also be epidural extension of tumor at the C5 level, but this level is incompletely imaged. Consider further evaluation with a dedicated cervical spine MRI with and without contrast. 3. No evidence of epidural spread of tumor in the lumbar spine. 4. Enlarged right common iliac chain lymph node, worrisome for metastatic disease. 5. Contrast-enhancing lesions in the bilateral iliac bones, worrisome for additional sites of osseous metastatic disease. Electronically Signed   By: Lorenza Cambridge M.D.   On: 03/23/2023 14:01   MR Lumbar Spine W Wo Contrast  Result Date: 03/23/2023 CLINICAL DATA:  Metastatic disease evaluation EXAM: MRI THORACIC AND LUMBAR SPINE WITHOUT AND WITH CONTRAST TECHNIQUE: Multiplanar and multiecho pulse sequences of the thoracic and lumbar spine were obtained without and with intravenous contrast. CONTRAST:  8mL GADAVIST GADOBUTROL 1 MMOL/ML IV SOLN COMPARISON:  Same day CT renal stone protocol FINDINGS: MRI THORACIC SPINE FINDINGS Alignment:  Physiologic. Vertebrae: There is diffuse osseous metastatic disease  with metastatic lesions at nearly every thoracic vertebral body. There no evidence of a severe pathologic compression deformity. There is evidence of epidural extension of tumor at the T6 level (series 16, image 9), the T8 level (series 17, image 32), T9-T11 levels (series 16, image 11), T12 level (series 17, image 50). Metastatic lesions are also seen throughout the cervical spine with possible epidural extension of tumor at the C5 level. Note these levels are only seen on the counting sequences and are incompletely assessed. Cord: Normal signal and morphology. Paraspinal and other soft tissues: Negative. Disc levels: There is severe spinal canal stenosis  at the T9-T11 levels due to the presence of epidural tumor. MRI LUMBAR SPINE FINDINGS Segmentation:  Standard. Alignment:  Grade 1 anterolisthesis of L5 on S1. Vertebrae: Metastatic lesions are seen at every lumbar and visualized sacral vertebral body Conus medullaris: Extends to the L2-L3 disc space level. There is a fatty filum terminale Paraspinal and other soft tissues: See same day CT Renal Stone protocol exam for additional findings. There is an enlarged right common iliac chain lymph node, worrisome for metastatic disease. Contrast-enhancing lesions are seen in the bilateral iliac bones (series 19, image 39) Disc levels: No evidence of high-grade spinal canal stenosis no evidence of epidural spread of tumor in the lumbar spine IMPRESSION: 1. Diffuse osseous metastatic disease with metastatic lesions at nearly every cervical, thoracic, and lumbar vertebral body. 2. Epidural extension of tumor at the T6, T8, T9-T11, and T12 levels resulting in severe spinal canal stenosis at the T9-T11 levels. There may also be epidural extension of tumor at the C5 level, but this level is incompletely imaged. Consider further evaluation with a dedicated cervical spine MRI with and without contrast. 3. No evidence of epidural spread of tumor in the lumbar spine. 4. Enlarged right common iliac chain lymph node, worrisome for metastatic disease. 5. Contrast-enhancing lesions in the bilateral iliac bones, worrisome for additional sites of osseous metastatic disease. Electronically Signed   By: Lorenza Cambridge M.D.   On: 03/23/2023 14:01   CT Renal Stone Study  Result Date: 03/23/2023 CLINICAL DATA:  Abdominal/flank pain with stone suspected EXAM: CT ABDOMEN AND PELVIS WITHOUT CONTRAST TECHNIQUE: Multidetector CT imaging of the abdomen and pelvis was performed following the standard protocol without IV contrast. RADIATION DOSE REDUCTION: This exam was performed according to the departmental dose-optimization program which includes  automated exposure control, adjustment of the mA and/or kV according to patient size and/or use of iterative reconstruction technique. COMPARISON:  None Available. FINDINGS: Lower chest: Posterior mediastinal nodularity centered on the affected vertebrae. Hepatobiliary: No focal liver abnormality.No evidence of biliary obstruction or stone. Pancreas: Unremarkable. Spleen: Unremarkable. Adrenals/Urinary Tract: Negative adrenals. Bilateral hydroureteronephrosis to the level of the distended urinary bladder which shows extensive masslike infiltration at its base, contiguous with severe masslike enlargement of the prostate. Stomach/Bowel: Indistinguishable fat plane between the prostate and anterior wall of the rectum. No bowel obstruction Vascular/Lymphatic: Pelvic lymphadenopathy and pelvic fat infiltration by the prostate cancer bilaterally. Nodularity at the aortic hiatus, likely adenopathy. Reproductive:Bulky masslike enlargement of the prostate invading the bladder base and pelvic fat, up to 13 cm anterior to posterior. Other: No ascites or pneumoperitoneum. Musculoskeletal: Diffuse heterogeneous spine attributed to metastatic disease. 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 Extraosseous tumor growth at least at lower thoracic levels into the posterior mediastinum, suspect epidural infiltration at T11. IMPRESSION: Urinary outlet obstruction with bilateral hydronephrosis due to bulky prostate mass invading the bladder base and pelvic fat.  The mass is indistinguishable from the anterior wall of the rectum. There is extensive osseous metastatic disease with suspected epidural tumor at the lower thoracic spine. Electronically Signed   By: Tiburcio Pea M.D.   On: 03/23/2023 07:53    Medications: Infusions:  sodium bicarbonate 150 mEq in sterile water 1,150 mL infusion     Scheduled Medications:  acetaminophen  1,000 mg Oral Q6H   amLODipine  10 mg Oral Daily   atorvastatin  40 mg Oral QHS    carvedilol  25 mg Oral BID WC   feeding supplement (NEPRO CARB STEADY)  237 mL Oral BID BM   sodium zirconium cyclosilicate  10 g Oral BID   I have reviewed scheduled and prn medications.  Physical Exam: Constitutional:Resting comfortably, in no acute distress. Cardio:Regular rate and rhythm. No murmurs, rubs, or gallops. Pulm:Clear to auscultation bilaterally. Normal work of breathing on room air. Abdomen:Soft, non-distended, mild tenderness to palpation over suprapubic area. JY:NWGNF catheter in place with red-tinged urine in bag. No CVA tenderness. AOZ:HYQMVHQI for extremity edema. Skin:Warm and dry. Neuro:Alert and oriented x3. No focal deficit noted. Psych:Pleasant mood and affect.  Champ Mungo, DO Internal Medicine PGY-2 03/24/2023,9:23 AM  LOS: 1 day

## 2023-03-24 NOTE — Progress Notes (Addendum)
Subjective: NAEON. Resting in bed and accompanied by his wife. Reviewed treatment plan so far, next steps, and expected side effects of ADT. Al questions answered to their satisfaction.   Objective: Vital signs in last 24 hours: Temp:  [98 F (36.7 C)-98.9 F (37.2 C)] 98.5 F (36.9 C) (06/06 0933) Pulse Rate:  [77-99] 84 (06/06 0933) Resp:  [16-18] 17 (06/06 0933) BP: (129-189)/(66-90) 158/86 (06/06 0933) SpO2:  [99 %-100 %] 99 % (06/06 0933)  Intake/Output from previous day: 06/05 0701 - 06/06 0700 In: 680 [Blood:670] Out: 1650 [Urine:1650]  Intake/Output this shift: No intake/output data recorded.  Physical Exam:  General: Alert and oriented CV: No cyanosis Lungs: equal chest rise Abdomen: Soft, NTND, no rebound or guarding Gu: foley in place draining clear yellow urine  Lab Results: Recent Labs    03/23/23 0504 03/24/23 0816  HGB 5.9* 8.2*  HCT 18.7* 25.0*   BMET Recent Labs    03/23/23 2137 03/24/23 0816  NA 134* 133*  K 5.4* 6.3*  CL 107 106  CO2 18* 16*  GLUCOSE 135* 98  BUN 25* 24*  CREATININE 3.05* 2.94*  CALCIUM 8.7* 9.0     Studies/Results: MR CERVICAL SPINE W WO CONTRAST  Result Date: 03/24/2023 CLINICAL DATA:  Paraspinal mass/tumor, cervical spine Epidural Expansion of tumor at c5. EXAM: MRI CERVICAL SPINE WITHOUT AND WITH CONTRAST TECHNIQUE: Multiplanar and multiecho pulse sequences of the cervical spine, to include the craniocervical junction and cervicothoracic junction, were obtained without and with intravenous contrast. CONTRAST:  8mL GADAVIST GADOBUTROL 1 MMOL/ML IV SOLN COMPARISON:  MRI thoracic spine 03/23/2023. FINDINGS: Alignment: Normal. Vertebrae: Diffuse marrow replacing, enhancing disease throughout the cervical spine, greatest from C4-C6. Ventral epidural extension of tumor from C4-C6, extending into the right neural foramen at C4-5 (axial image 22 series 10) and left neural foramen C5-6 (axial image 27 series 10). Bowing of the  posterior cortex at C5 on left results deformity of the left ventral cord with associated T2 hyperintensity, favored to reflect edema (axial image 26 series 6). Cord: As above. Posterior Fossa, vertebral arteries, paraspinal tissues: Diffuse marrow replacing, enhancing disease in the clivus and occipital condyles without evidence of epidural extension. Disc levels: C2-C3: No disc herniation or spinal canal stenosis. Facet arthropathy and uncovertebral joint spurring results in moderate bilateral neural foraminal narrowing. C3-C4: Small disc bulge without spinal canal stenosis. Right-greater-than-left facet arthropathy and uncovertebral joint spurring results in severe right neural foraminal narrowing. C4-C5: Ventral epidural and perineural extension of tumor along the right C5 nerve and C4-5 neural foramen results in mild deformity of the right ventral cord and severe right neural foraminal narrowing. Posterolateral bowing of the C5 vertebral body cortex results in moderate left neural foraminal narrowing. C5-C6: Posterolateral bowing of the C5 posterior cortex results in moderate left neural foraminal narrowing and deformity of the left ventral cord with associated T2 hyperintensity, as above. C6-C7: Disc bulge results in mild spinal canal stenosis. Facet arthropathy and uncovertebral joint spurring contribute to moderate bilateral neural foraminal narrowing. C7-T1: No disc herniation, spinal canal stenosis or neural foraminal narrowing. Mild left facet arthropathy. IMPRESSION: 1. Diffuse marrow replacing, enhancing disease throughout the cervical spine, greatest from C4-C6. 2. Ventral epidural and perineural extension of tumor from C4-C6, extending into the right neural foramen at C4-5 and left neural foramen at C5-6. 3. Ventral epidural and perineural extension of tumor along the right C5 nerve and C4-5 neural foramen results in mild deformity of the right ventral cord and severe right neural  foraminal narrowing.  4. Posterolateral bowing of the C5 vertebral body cortex results in moderate left neural foraminal narrowing at C4-5 and C5-6, as well as deformity of the left ventral cord with edema at the C5-6 level. 5. Diffuse marrow replacing, enhancing disease in the clivus and occipital condyles without evidence of epidural extension. 6. Multilevel cervical spondylosis, as described above. Electronically Signed   By: Orvan Falconer M.D.   On: 03/24/2023 09:38   MR THORACIC SPINE W WO CONTRAST  Result Date: 03/23/2023 CLINICAL DATA:  Metastatic disease evaluation EXAM: MRI THORACIC AND LUMBAR SPINE WITHOUT AND WITH CONTRAST TECHNIQUE: Multiplanar and multiecho pulse sequences of the thoracic and lumbar spine were obtained without and with intravenous contrast. CONTRAST:  8mL GADAVIST GADOBUTROL 1 MMOL/ML IV SOLN COMPARISON:  Same day CT renal stone protocol FINDINGS: MRI THORACIC SPINE FINDINGS Alignment:  Physiologic. Vertebrae: There is diffuse osseous metastatic disease with metastatic lesions at nearly every thoracic vertebral body. There no evidence of a severe pathologic compression deformity. There is evidence of epidural extension of tumor at the T6 level (series 16, image 9), the T8 level (series 17, image 32), T9-T11 levels (series 16, image 11), T12 level (series 17, image 50). Metastatic lesions are also seen throughout the cervical spine with possible epidural extension of tumor at the C5 level. Note these levels are only seen on the counting sequences and are incompletely assessed. Cord: Normal signal and morphology. Paraspinal and other soft tissues: Negative. Disc levels: There is severe spinal canal stenosis at the T9-T11 levels due to the presence of epidural tumor. MRI LUMBAR SPINE FINDINGS Segmentation:  Standard. Alignment:  Grade 1 anterolisthesis of L5 on S1. Vertebrae: Metastatic lesions are seen at every lumbar and visualized sacral vertebral body Conus medullaris: Extends to the L2-L3 disc space  level. There is a fatty filum terminale Paraspinal and other soft tissues: See same day CT Renal Stone protocol exam for additional findings. There is an enlarged right common iliac chain lymph node, worrisome for metastatic disease. Contrast-enhancing lesions are seen in the bilateral iliac bones (series 19, image 39) Disc levels: No evidence of high-grade spinal canal stenosis no evidence of epidural spread of tumor in the lumbar spine IMPRESSION: 1. Diffuse osseous metastatic disease with metastatic lesions at nearly every cervical, thoracic, and lumbar vertebral body. 2. Epidural extension of tumor at the T6, T8, T9-T11, and T12 levels resulting in severe spinal canal stenosis at the T9-T11 levels. There may also be epidural extension of tumor at the C5 level, but this level is incompletely imaged. Consider further evaluation with a dedicated cervical spine MRI with and without contrast. 3. No evidence of epidural spread of tumor in the lumbar spine. 4. Enlarged right common iliac chain lymph node, worrisome for metastatic disease. 5. Contrast-enhancing lesions in the bilateral iliac bones, worrisome for additional sites of osseous metastatic disease. Electronically Signed   By: Lorenza Cambridge M.D.   On: 03/23/2023 14:01   MR Lumbar Spine W Wo Contrast  Result Date: 03/23/2023 CLINICAL DATA:  Metastatic disease evaluation EXAM: MRI THORACIC AND LUMBAR SPINE WITHOUT AND WITH CONTRAST TECHNIQUE: Multiplanar and multiecho pulse sequences of the thoracic and lumbar spine were obtained without and with intravenous contrast. CONTRAST:  8mL GADAVIST GADOBUTROL 1 MMOL/ML IV SOLN COMPARISON:  Same day CT renal stone protocol FINDINGS: MRI THORACIC SPINE FINDINGS Alignment:  Physiologic. Vertebrae: There is diffuse osseous metastatic disease with metastatic lesions at nearly every thoracic vertebral body. There no evidence of a severe pathologic  compression deformity. There is evidence of epidural extension of tumor at  the T6 level (series 16, image 9), the T8 level (series 17, image 32), T9-T11 levels (series 16, image 11), T12 level (series 17, image 50). Metastatic lesions are also seen throughout the cervical spine with possible epidural extension of tumor at the C5 level. Note these levels are only seen on the counting sequences and are incompletely assessed. Cord: Normal signal and morphology. Paraspinal and other soft tissues: Negative. Disc levels: There is severe spinal canal stenosis at the T9-T11 levels due to the presence of epidural tumor. MRI LUMBAR SPINE FINDINGS Segmentation:  Standard. Alignment:  Grade 1 anterolisthesis of L5 on S1. Vertebrae: Metastatic lesions are seen at every lumbar and visualized sacral vertebral body Conus medullaris: Extends to the L2-L3 disc space level. There is a fatty filum terminale Paraspinal and other soft tissues: See same day CT Renal Stone protocol exam for additional findings. There is an enlarged right common iliac chain lymph node, worrisome for metastatic disease. Contrast-enhancing lesions are seen in the bilateral iliac bones (series 19, image 39) Disc levels: No evidence of high-grade spinal canal stenosis no evidence of epidural spread of tumor in the lumbar spine IMPRESSION: 1. Diffuse osseous metastatic disease with metastatic lesions at nearly every cervical, thoracic, and lumbar vertebral body. 2. Epidural extension of tumor at the T6, T8, T9-T11, and T12 levels resulting in severe spinal canal stenosis at the T9-T11 levels. There may also be epidural extension of tumor at the C5 level, but this level is incompletely imaged. Consider further evaluation with a dedicated cervical spine MRI with and without contrast. 3. No evidence of epidural spread of tumor in the lumbar spine. 4. Enlarged right common iliac chain lymph node, worrisome for metastatic disease. 5. Contrast-enhancing lesions in the bilateral iliac bones, worrisome for additional sites of osseous  metastatic disease. Electronically Signed   By: Lorenza Cambridge M.D.   On: 03/23/2023 14:01   CT Renal Stone Study  Result Date: 03/23/2023 CLINICAL DATA:  Abdominal/flank pain with stone suspected EXAM: CT ABDOMEN AND PELVIS WITHOUT CONTRAST TECHNIQUE: Multidetector CT imaging of the abdomen and pelvis was performed following the standard protocol without IV contrast. RADIATION DOSE REDUCTION: This exam was performed according to the departmental dose-optimization program which includes automated exposure control, adjustment of the mA and/or kV according to patient size and/or use of iterative reconstruction technique. COMPARISON:  None Available. FINDINGS: Lower chest: Posterior mediastinal nodularity centered on the affected vertebrae. Hepatobiliary: No focal liver abnormality.No evidence of biliary obstruction or stone. Pancreas: Unremarkable. Spleen: Unremarkable. Adrenals/Urinary Tract: Negative adrenals. Bilateral hydroureteronephrosis to the level of the distended urinary bladder which shows extensive masslike infiltration at its base, contiguous with severe masslike enlargement of the prostate. Stomach/Bowel: Indistinguishable fat plane between the prostate and anterior wall of the rectum. No bowel obstruction Vascular/Lymphatic: Pelvic lymphadenopathy and pelvic fat infiltration by the prostate cancer bilaterally. Nodularity at the aortic hiatus, likely adenopathy. Reproductive:Bulky masslike enlargement of the prostate invading the bladder base and pelvic fat, up to 13 cm anterior to posterior. Other: No ascites or pneumoperitoneum. Musculoskeletal: Diffuse heterogeneous spine attributed to metastatic disease. 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 Extraosseous tumor growth at least at lower thoracic levels into the posterior mediastinum, suspect epidural infiltration at T11. IMPRESSION: Urinary outlet obstruction with bilateral hydronephrosis due to bulky prostate mass invading the bladder  base and pelvic fat. The mass is indistinguishable from the anterior wall of the rectum. There is extensive osseous  metastatic disease with suspected epidural tumor at the lower thoracic spine. Electronically Signed   By: Tiburcio Pea M.D.   On: 03/23/2023 07:53    Assessment/Plan: #Urinary retention #Hematuria #Prostate Cancer  PSA 524 on 03/24/23, hardened prostate on DRE, suggestive of malignancy. 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 Start Firmagon loading dose today, follow up in clinic in 30 days to discuss continuation of ADT.  NM Bone scan pending, decide of biopsy type/location after wards. If no clear lesion available, will need prostate biopsy.   #Bladder outlet obstruction #AKI #hydronephrosis   Scr 2.94/K+ 6.3 today. [id number]-1650 Foley to remain in place through renal recovery and go home with him. Likely to obstruct again without surgical intervention. Discuss TURP outpt Consider renal US in next 48 hours depending on rate of recovery.    LOS: 1 day   Elmon Kirschner, NP [facility] Pager: [phone]  03/24/2023, 10:44 AM

## 2023-03-25 ENCOUNTER — Inpatient Hospital Stay (HOSPITAL_COMMUNITY): Payer: 59

## 2023-03-25 DIAGNOSIS — N179 Acute kidney failure, unspecified: Secondary | ICD-10-CM | POA: Diagnosis not present

## 2023-03-25 DIAGNOSIS — N138 Other obstructive and reflux uropathy: Secondary | ICD-10-CM | POA: Diagnosis not present

## 2023-03-25 DIAGNOSIS — N1330 Unspecified hydronephrosis: Secondary | ICD-10-CM | POA: Diagnosis not present

## 2023-03-25 DIAGNOSIS — N429 Disorder of prostate, unspecified: Secondary | ICD-10-CM | POA: Diagnosis not present

## 2023-03-25 LAB — GLUCOSE, CAPILLARY
Glucose-Capillary: 88 mg/dL (ref 70–99)
Glucose-Capillary: 165 mg/dL — ABNORMAL HIGH (ref 70–99)
Glucose-Capillary: 167 mg/dL — ABNORMAL HIGH (ref 70–99)
Glucose-Capillary: 175 mg/dL — ABNORMAL HIGH (ref 70–99)

## 2023-03-25 LAB — RENAL FUNCTION PANEL
Albumin: 2.8 g/dL — ABNORMAL LOW (ref 3.5–5.0)
Anion gap: 14 (ref 5–15)
BUN: 26 mg/dL — ABNORMAL HIGH (ref 8–23)
CO2: 21 mmol/L — ABNORMAL LOW (ref 22–32)
Calcium: 9.2 mg/dL (ref 8.9–10.3)
Chloride: 101 mmol/L (ref 98–111)
Creatinine, Ser: 2.79 mg/dL — ABNORMAL HIGH (ref 0.61–1.24)
GFR, Estimated: 25 mL/min — ABNORMAL LOW (ref >60)
Glucose, Bld: 86 mg/dL (ref 70–99)
Phosphorus: 4.7 mg/dL — ABNORMAL HIGH (ref 2.5–4.6)
Potassium: 4.6 mmol/L (ref 3.5–5.1)
Sodium: 136 mmol/L (ref 135–145)

## 2023-03-25 LAB — CBC
HCT: 25.7 % — ABNORMAL LOW (ref 39.0–52.0)
Hemoglobin: 8.6 g/dL — ABNORMAL LOW (ref 13.0–17.0)
MCH: 23.9 pg — ABNORMAL LOW (ref 26.0–34.0)
MCHC: 33.5 g/dL (ref 30.0–36.0)
MCV: 71.4 fL — ABNORMAL LOW (ref 80.0–100.0)
Platelets: 400 10*3/uL (ref 150–400)
RBC: 3.6 MIL/uL — ABNORMAL LOW (ref 4.22–5.81)
RDW: 20.9 % — ABNORMAL HIGH (ref 11.5–15.5)
WBC: 9 10*3/uL (ref 4.0–10.5)
nRBC: 0 % (ref 0.0–0.2)

## 2023-03-25 LAB — TESTOSTERONE: Testosterone: 191 ng/dL — ABNORMAL LOW (ref 264–916)

## 2023-03-25 MED ORDER — SENNOSIDES-DOCUSATE SODIUM 8.6-50 MG PO TABS
1.0000 | ORAL_TABLET | Freq: Two times a day (BID) | ORAL | Status: DC
  Administered 2023-03-25 – 2023-03-29 (×8): 1 via ORAL
  Filled 2023-03-25 (×8): qty 1

## 2023-03-25 MED ORDER — HYOSCYAMINE SULFATE 0.125 MG SL SUBL
0.2500 mg | SUBLINGUAL_TABLET | Freq: Four times a day (QID) | SUBLINGUAL | Status: DC | PRN
  Administered 2023-03-26 – 2023-03-29 (×6): 0.25 mg via SUBLINGUAL
  Filled 2023-03-25 (×6): qty 2

## 2023-03-25 MED ORDER — HYOSCYAMINE SULFATE 0.125 MG PO TBDP: 0.2500 mg | ORAL_TABLET | Freq: Four times a day (QID) | ORAL | Status: DC | PRN

## 2023-03-25 NOTE — Progress Notes (Signed)
HD#2 Subjective:   Summary: This is a 67 year old male with a past medical history of hypertension, type 2 diabetes who presents for chronic back pain urinary retention. Imaging revealed suspicious prostate mass as well as bilateral hydronephrosis.  Patient admitted for post renal AKI in the setting of obstruction secondary to suspected prostatic cancer.  Overnight Events: No acute overnight events   Patient evaluated bedside today.  He states his pain is better.  He states he is doing well.  He has not been up and walking.  She denies having a bowel movement.  He otherwise states he is doing well.  He is hoping this is not cancer.  I did tell him that it is likely.  Objective:  Vital signs in last 24 hours: Vitals:   03/24/23 2033 03/25/23 0534 03/25/23 0813 03/25/23 0814  BP: 138/74 (!) 144/76  (!) 146/75  Pulse: 72 76 78 80  Resp: 17 17  16   Temp: 98.4 F (36.9 C) 98.1 F (36.7 C)  98.2 F (36.8 C)  TempSrc: Oral Oral  Oral  SpO2: 98% 100% 100% 100%  Weight:      Height:       Supplemental O2: Room Air SpO2: 100 %   Physical Exam:  Constitutional: Resting in bed, no acute distress HENT: normocephalic atraumatic, mucous membranes moist Eyes: conjunctiva non-erythematous Cardiovascular: regular rate and rhythm, no m/r/g Pulmonary/Chest: normal work of breathing on room air, lungs clear to auscultation bilaterally Abdominal: soft, non-tender, non-distended GU: Foley draining well, with some pink-tinged urine  Filed Weights   03/23/23 0456  Weight: 86.2 kg     Intake/Output Summary (Last 24 hours) at 03/25/2023 1330 Last data filed at 03/25/2023 0536 Gross per 24 hour  Intake 240 ml  Output 1775 ml  Net -1535 ml   Net IO Since Admission: -4,180 mL [03/25/23 1330]  Pertinent Labs:    Latest Ref Rng & Units 03/25/2023    8:24 AM 03/24/2023    8:16 AM 03/23/2023    5:04 AM  CBC  WBC 4.0 - 10.5 K/uL 9.0  8.7  9.8   Hemoglobin 13.0 - 17.0 g/dL 8.6  8.2  5.9    Hematocrit 39.0 - 52.0 % 25.7  25.0  18.7   Platelets 150 - 400 K/uL 400  298  411        Latest Ref Rng & Units 03/25/2023    8:24 AM 03/24/2023    1:16 PM 03/24/2023    8:16 AM  CMP  Glucose 70 - 99 mg/dL 86  92  98   BUN 8 - 23 mg/dL 26  26  24    Creatinine 0.61 - 1.24 mg/dL 1.61  0.96  0.45   Sodium 135 - 145 mmol/L 136  133  133   Potassium 3.5 - 5.1 mmol/L 4.6  5.1  6.3   Chloride 98 - 111 mmol/L 101  106  106   CO2 22 - 32 mmol/L 21  16  16    Calcium 8.9 - 10.3 mg/dL 9.2  9.1  9.0     Imaging: US RENAL  Result Date: 03/25/2023 CLINICAL DATA:  Hydronephrosis EXAM: RENAL / URINARY TRACT ULTRASOUND COMPLETE COMPARISON:  Renal stone protocol CT 03/23/2023 FINDINGS: Right Kidney: Renal measurements: 10.2 x 5.6 x 6.5 cm = volume: 197 mL. Echogenicity within normal limits. Mild to moderate right hydronephrosis is unchanged since prior CT. Left Kidney: Renal measurements: 12.4 x 7.0 x 5.7 cm = volume: 260 mL. Echogenicity within  normal limits. Mild left hydronephrosis is unchanged from prior exam. Bladder: Collapsed around Foley catheter balloon. Other: None. IMPRESSION: Unchanged bilateral hydronephrosis. Electronically Signed   By: Acquanetta Belling M.D.   On: 03/25/2023 10:27   NM Bone Scan Whole Body  Result Date: 03/24/2023 CLINICAL DATA:  Prostate cancer. EXAM: NUCLEAR MEDICINE WHOLE BODY BONE SCAN TECHNIQUE: Whole body anterior and posterior images were obtained approximately 3 hours after intravenous injection of radiopharmaceutical. RADIOPHARMACEUTICALS:  21.8 mCi Technetium-2m MDP IV COMPARISON:  MRI spine 03/23/2023.  Abdomen pelvis CT 03/23/2019 FINDINGS: Physiologic distribution of tracer along the kidneys and bladder. There are areas of degenerative type uptake identified along the shoulders, spine and sacroiliac regions. However there is multifocal areas of osseous metastatic disease. This includes along both proximal femurs, scattered along the pelvis more right-sided than left,  multiple bilateral ribs, scapula sternum and thoracolumbar spine. Some areas as well along the lower cervical spine. IMPRESSION: Multifocal osseous metastatic disease. Areas include numerous bilateral ribs, sternum, scapula, spine, pelvis and proximal femurs Electronically Signed   By: Karen Kays M.D.   On: 03/24/2023 15:36    Assessment/Plan:   Principal Problem:   AKI (acute kidney injury) (HCC) Active Problems:   Hyperkalemia   Microcytic anemia   Metabolic acidosis, normal anion gap (NAG)   Essential hypertension, benign   Patient Summary: This is a 67 year old male with a past medical history of hypertension, type 2 diabetes who presents for chronic back pain urinary retention. Imaging revealed suspicious prostate mass as well as bilateral hydronephrosis.  Patient admitted for post renal AKI in the setting of obstruction secondary to suspected prostate cancer.  #Postrenal AKI 2/2 obstructive uropathy  #Bilateral hydronephrosis secondary to obstruction from suspected metastatic prostate cancer Bone scan showing extensive disease throughout the skeletal system including bilateral ribs, sternum, scapula, femurs, spine, and pelvis.  Patient's pain is well-controlled.  Patient got his dose of Firmagon yesterday.  Will need follow-up with urology in 1 month.  Foley draining well.  Creatinine still elevated at 2.79, slowly trending down.  Repeat renal ultrasound showing unchanged bilateral hydronephrosis.  Will continue to relieve kidneys. Will need to maintain Foley and patient will likely need TURP in the future to prevent recurrent obstruction. -Urology following -Nephrology following -Pain control with Dilaudid and Tylenol -Patient received Firmagon 240 mg injection 03/25/2023 -Testosterone pending -Consulted IR for biopsy from bone lesion -Follow-up biopsy  #Hyperkalemia, resolved Potassium decreased down to 4.6 today.  Bicarb infusion helping.  Note, helped as well.  Will continue  to monitor potassium. -Continue to monitor potassium -Bicarb infusion decreased -RFP pending  #Anemia of chronic disease Hemoglobin stable at 8.6 today.  Will continue to monitor. 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  Pain is well-controlled at this time.  -Continue to monitor for worsening neurological signs -Continue pain control with Dilaudid and Tylenol  #NAGMA Improving.  Nephrology following.  Will decrease bicarb infusion today per nephrology.  -Decrease sodium bicarb infusion per nephrology  #Severe spinal stenosis T9-T11 Stable at this time, patient not complaining of any weakness, saddle anesthesia, or any other neurological issues.  -Continue pain control with Dilaudid and Tylenol -Continue to monitor for worsening neurological symptoms  #Hypertension Blood pressure measuring well. -Continue amlodipine 10 mg daily -Continue carvedilol 25 mg twice daily -Hold home spironolactone -Hold home losartan 100 mg  #Type 2 diabetes Glucose measuring well during hospitalization.  Hold home metformin. -Continue to monitor CBGs  #Hyperlipidemia -Continue atorvastatin  40 mg daily  Diet: Normal IVF: Sodium bicarb infusion VTE: DOAC Code: Full  Dispo: Anticipated discharge to Home in 3 days pending clinical improvement.   Modena Slater DO Internal Medicine Resident PGY-1 [phone] Please contact the on call pager after 5 pm and on weekends at [phone].

## 2023-03-25 NOTE — Progress Notes (Signed)
Assessment/ Plan: 67 year old male with past medical history significant for hypertension, T2DM, HLD presented with several weeks of back pain, decreased urine output, seen as a consultation for the management of AKI and hyperkalemia.  1. AKI: 2/2 obstructive uropathy from metastatic prostate cancer causing urinary outlet obstruction and bilateral hydronephrosis. Foley catheter in place, had UOP over past 24hrs w/ postobstructive diuresis. Continues to have mild but improved suprapubic discomfort. Renal function slowly improving. Foley with red-tinted urine. Plan to change fluid (see below) and trend renal function. Avoid nephrotoxic medications, continue to hold home ARB and aldactone. 2.Electrolyte derangements: BMP with hyponatremia 136 and hyperkalemia 4.6. Hyperkalemia likely 2/2 AKI, complicated possibly by blood transfusion 06/05.   3.Non-anion gap metabolic acidosis: Bicarbonate 21. Will decrease  fluids to 75 mL/h sodium bicarbonate 150 mEq in sterile water and likely stop in AM. 4.Hypertension/volume: Remains mildly hypertensive.Home amlodipine and carvedilol restarted by primary team on admission. No concern for volume overload at this time. Per primary. 5.Anemia: In the setting of active malignancy. Improved with PRBC transfusion on admission. Microcytic, Hgb 8.2 and MCV 73.1. Iron studies with iron 46, TIBC 269, saturation 17%, ferritin 129. Total body iron deficit of 1286 mg. Will give ferrlecit 250 mg IV daily x 3 days. Trend CBC, transfuse for Hgb <7. 6. Metastatic prostate cancer: Urology following for management and recommendations. Not sure if he and his wife understand the significance of his cancer. Encouraged him to ask any questions that he may have.  Subjective:  Patient states that he feels okay this morning. Walking is uncomfortable due to foley catheter. Denies f/c/n/v/sob.   Objective Vital signs in last 24 hours: Vitals:   03/24/23 2033 03/25/23 0534 03/25/23 0813  03/25/23 0814  BP: 138/74 (!) 144/76  (!) 146/75  Pulse: 72 76 78 80  Resp: 17 17  16   Temp: 98.4 F (36.9 C) 98.1 F (36.7 C)  98.2 F (36.8 C)  TempSrc: Oral Oral  Oral  SpO2: 98% 100% 100% 100%  Weight:      Height:       Weight change:   Intake/Output Summary (Last 24 hours) at 03/25/2023 1111 Last data filed at 03/25/2023 0536 Gross per 24 hour  Intake 340 ml  Output 2450 ml  Net -2110 ml       Labs: Basic Metabolic Panel: Recent Labs  Lab 03/23/23 2137 03/24/23 0816 03/24/23 1316 03/25/23 0824  NA 134* 133* 133* 136  K 5.4* 6.3* 5.1 4.6  CL 107 106 106 101  CO2 18* 16* 16* 21*  GLUCOSE 135* 98 92 86  BUN 25* 24* 26* 26*  CREATININE 3.05* 2.94* 2.93* 2.79*  CALCIUM 8.7* 9.0 9.1 9.2  PHOS 4.6 4.4  --  4.7*   Liver Function Tests: Recent Labs  Lab 03/23/23 2137 03/24/23 0816 03/25/23 0824  AST 18  --   --   ALT 10  --   --   ALKPHOS 65  --   --   BILITOT 0.5  --   --   PROT 7.1  --   --   ALBUMIN 2.7*  2.7* 2.8* 2.8*   No results for input(s): "LIPASE", "AMYLASE" in the last 168 hours. No results for input(s): "AMMONIA" in the last 168 hours.  CBC: Recent Labs  Lab 03/23/23 0504 03/24/23 0816 03/25/23 0824  WBC 9.8 8.7 9.0  HGB 5.9* 8.2* 8.6*  HCT 18.7* 25.0* 25.7*  MCV 68.2* 73.1* 71.4*  PLT 411* 298 400   Cardiac Enzymes:  Recent Labs  Lab 03/23/23 2137  CKTOTAL 123   CBG: Recent Labs  Lab 03/24/23 0932 03/24/23 1223 03/24/23 1641 03/24/23 2120 03/25/23 0818  GLUCAP 99 97 124* 102* 88    Iron Studies:  Recent Labs    03/23/23 2137  IRON 46  TIBC 269  FERRITIN 129   Studies/Results: US RENAL  Result Date: 03/25/2023 CLINICAL DATA:  Hydronephrosis EXAM: RENAL / URINARY TRACT ULTRASOUND COMPLETE COMPARISON:  Renal stone protocol CT 03/23/2023 FINDINGS: Right Kidney: Renal measurements: 10.2 x 5.6 x 6.5 cm = volume: 197 mL. Echogenicity within normal limits. Mild to moderate right hydronephrosis is unchanged since prior  CT. Left Kidney: Renal measurements: 12.4 x 7.0 x 5.7 cm = volume: 260 mL. Echogenicity within normal limits. Mild left hydronephrosis is unchanged from prior exam. Bladder: Collapsed around Foley catheter balloon. Other: None. IMPRESSION: Unchanged bilateral hydronephrosis. Electronically Signed   By: Acquanetta Belling M.D.   On: 03/25/2023 10:27   NM Bone Scan Whole Body  Result Date: 03/24/2023 CLINICAL DATA:  Prostate cancer. EXAM: NUCLEAR MEDICINE WHOLE BODY BONE SCAN TECHNIQUE: Whole body anterior and posterior images were obtained approximately 3 hours after intravenous injection of radiopharmaceutical. RADIOPHARMACEUTICALS:  21.8 mCi Technetium-62m MDP IV COMPARISON:  MRI spine 03/23/2023.  Abdomen pelvis CT 03/23/2019 FINDINGS: Physiologic distribution of tracer along the kidneys and bladder. There are areas of degenerative type uptake identified along the shoulders, spine and sacroiliac regions. However there is multifocal areas of osseous metastatic disease. This includes along both proximal femurs, scattered along the pelvis more right-sided than left, multiple bilateral ribs, scapula sternum and thoracolumbar spine. Some areas as well along the lower cervical spine. IMPRESSION: Multifocal osseous metastatic disease. Areas include numerous bilateral ribs, sternum, scapula, spine, pelvis and proximal femurs Electronically Signed   By: Karen Kays M.D.   On: 03/24/2023 15:36   MR CERVICAL SPINE W WO CONTRAST  Result Date: 03/24/2023 CLINICAL DATA:  Paraspinal mass/tumor, cervical spine Epidural Expansion of tumor at c5. EXAM: MRI CERVICAL SPINE WITHOUT AND WITH CONTRAST TECHNIQUE: Multiplanar and multiecho pulse sequences of the cervical spine, to include the craniocervical junction and cervicothoracic junction, were obtained without and with intravenous contrast. CONTRAST:  8mL GADAVIST GADOBUTROL 1 MMOL/ML IV SOLN COMPARISON:  MRI thoracic spine 03/23/2023. FINDINGS: Alignment: Normal. Vertebrae:  Diffuse marrow replacing, enhancing disease throughout the cervical spine, greatest from C4-C6. Ventral epidural extension of tumor from C4-C6, extending into the right neural foramen at C4-5 (axial image 22 series 10) and left neural foramen C5-6 (axial image 27 series 10). Bowing of the posterior cortex at C5 on left results deformity of the left ventral cord with associated T2 hyperintensity, favored to reflect edema (axial image 26 series 6). Cord: As above. Posterior Fossa, vertebral arteries, paraspinal tissues: Diffuse marrow replacing, enhancing disease in the clivus and occipital condyles without evidence of epidural extension. Disc levels: C2-C3: No disc herniation or spinal canal stenosis. Facet arthropathy and uncovertebral joint spurring results in moderate bilateral neural foraminal narrowing. C3-C4: Small disc bulge without spinal canal stenosis. Right-greater-than-left facet arthropathy and uncovertebral joint spurring results in severe right neural foraminal narrowing. C4-C5: Ventral epidural and perineural extension of tumor along the right C5 nerve and C4-5 neural foramen results in mild deformity of the right ventral cord and severe right neural foraminal narrowing. Posterolateral bowing of the C5 vertebral body cortex results in moderate left neural foraminal narrowing. C5-C6: Posterolateral bowing of the C5 posterior cortex results in moderate left neural foraminal narrowing and deformity  of the left ventral cord with associated T2 hyperintensity, as above. C6-C7: Disc bulge results in mild spinal canal stenosis. Facet arthropathy and uncovertebral joint spurring contribute to moderate bilateral neural foraminal narrowing. C7-T1: No disc herniation, spinal canal stenosis or neural foraminal narrowing. Mild left facet arthropathy. IMPRESSION: 1. Diffuse marrow replacing, enhancing disease throughout the cervical spine, greatest from C4-C6. 2. Ventral epidural and perineural extension of tumor  from C4-C6, extending into the right neural foramen at C4-5 and left neural foramen at C5-6. 3. Ventral epidural and perineural extension of tumor along the right C5 nerve and C4-5 neural foramen results in mild deformity of the right ventral cord and severe right neural foraminal narrowing. 4. Posterolateral bowing of the C5 vertebral body cortex results in moderate left neural foraminal narrowing at C4-5 and C5-6, as well as deformity of the left ventral cord with edema at the C5-6 level. 5. Diffuse marrow replacing, enhancing disease in the clivus and occipital condyles without evidence of epidural extension. 6. Multilevel cervical spondylosis, as described above. Electronically Signed   By: Orvan Falconer M.D.   On: 03/24/2023 09:38   MR THORACIC SPINE W WO CONTRAST  Result Date: 03/23/2023 CLINICAL DATA:  Metastatic disease evaluation EXAM: MRI THORACIC AND LUMBAR SPINE WITHOUT AND WITH CONTRAST TECHNIQUE: Multiplanar and multiecho pulse sequences of the thoracic and lumbar spine were obtained without and with intravenous contrast. CONTRAST:  8mL GADAVIST GADOBUTROL 1 MMOL/ML IV SOLN COMPARISON:  Same day CT renal stone protocol FINDINGS: MRI THORACIC SPINE FINDINGS Alignment:  Physiologic. Vertebrae: There is diffuse osseous metastatic disease with metastatic lesions at nearly every thoracic vertebral body. There no evidence of a severe pathologic compression deformity. There is evidence of epidural extension of tumor at the T6 level (series 16, image 9), the T8 level (series 17, image 32), T9-T11 levels (series 16, image 11), T12 level (series 17, image 50). Metastatic lesions are also seen throughout the cervical spine with possible epidural extension of tumor at the C5 level. Note these levels are only seen on the counting sequences and are incompletely assessed. Cord: Normal signal and morphology. Paraspinal and other soft tissues: Negative. Disc levels: There is severe spinal canal stenosis at the  T9-T11 levels due to the presence of epidural tumor. MRI LUMBAR SPINE FINDINGS Segmentation:  Standard. Alignment:  Grade 1 anterolisthesis of L5 on S1. Vertebrae: Metastatic lesions are seen at every lumbar and visualized sacral vertebral body Conus medullaris: Extends to the L2-L3 disc space level. There is a fatty filum terminale Paraspinal and other soft tissues: See same day CT Renal Stone protocol exam for additional findings. There is an enlarged right common iliac chain lymph node, worrisome for metastatic disease. Contrast-enhancing lesions are seen in the bilateral iliac bones (series 19, image 39) Disc levels: No evidence of high-grade spinal canal stenosis no evidence of epidural spread of tumor in the lumbar spine IMPRESSION: 1. Diffuse osseous metastatic disease with metastatic lesions at nearly every cervical, thoracic, and lumbar vertebral body. 2. Epidural extension of tumor at the T6, T8, T9-T11, and T12 levels resulting in severe spinal canal stenosis at the T9-T11 levels. There may also be epidural extension of tumor at the C5 level, but this level is incompletely imaged. Consider further evaluation with a dedicated cervical spine MRI with and without contrast. 3. No evidence of epidural spread of tumor in the lumbar spine. 4. Enlarged right common iliac chain lymph node, worrisome for metastatic disease. 5. Contrast-enhancing lesions in the bilateral iliac bones, worrisome  for additional sites of osseous metastatic disease. Electronically Signed   By: Lorenza Cambridge M.D.   On: 03/23/2023 14:01   MR Lumbar Spine W Wo Contrast  Result Date: 03/23/2023 CLINICAL DATA:  Metastatic disease evaluation EXAM: MRI THORACIC AND LUMBAR SPINE WITHOUT AND WITH CONTRAST TECHNIQUE: Multiplanar and multiecho pulse sequences of the thoracic and lumbar spine were obtained without and with intravenous contrast. CONTRAST:  8mL GADAVIST GADOBUTROL 1 MMOL/ML IV SOLN COMPARISON:  Same day CT renal stone protocol  FINDINGS: MRI THORACIC SPINE FINDINGS Alignment:  Physiologic. Vertebrae: There is diffuse osseous metastatic disease with metastatic lesions at nearly every thoracic vertebral body. There no evidence of a severe pathologic compression deformity. There is evidence of epidural extension of tumor at the T6 level (series 16, image 9), the T8 level (series 17, image 32), T9-T11 levels (series 16, image 11), T12 level (series 17, image 50). Metastatic lesions are also seen throughout the cervical spine with possible epidural extension of tumor at the C5 level. Note these levels are only seen on the counting sequences and are incompletely assessed. Cord: Normal signal and morphology. Paraspinal and other soft tissues: Negative. Disc levels: There is severe spinal canal stenosis at the T9-T11 levels due to the presence of epidural tumor. MRI LUMBAR SPINE FINDINGS Segmentation:  Standard. Alignment:  Grade 1 anterolisthesis of L5 on S1. Vertebrae: Metastatic lesions are seen at every lumbar and visualized sacral vertebral body Conus medullaris: Extends to the L2-L3 disc space level. There is a fatty filum terminale Paraspinal and other soft tissues: See same day CT Renal Stone protocol exam for additional findings. There is an enlarged right common iliac chain lymph node, worrisome for metastatic disease. Contrast-enhancing lesions are seen in the bilateral iliac bones (series 19, image 39) Disc levels: No evidence of high-grade spinal canal stenosis no evidence of epidural spread of tumor in the lumbar spine IMPRESSION: 1. Diffuse osseous metastatic disease with metastatic lesions at nearly every cervical, thoracic, and lumbar vertebral body. 2. Epidural extension of tumor at the T6, T8, T9-T11, and T12 levels resulting in severe spinal canal stenosis at the T9-T11 levels. There may also be epidural extension of tumor at the C5 level, but this level is incompletely imaged. Consider further evaluation with a dedicated  cervical spine MRI with and without contrast. 3. No evidence of epidural spread of tumor in the lumbar spine. 4. Enlarged right common iliac chain lymph node, worrisome for metastatic disease. 5. Contrast-enhancing lesions in the bilateral iliac bones, worrisome for additional sites of osseous metastatic disease. Electronically Signed   By: Lorenza Cambridge M.D.   On: 03/23/2023 14:01    Medications: Infusions:  ferric gluconate (FERRLECIT) IVPB 250 mg (03/25/23 0834)   sodium bicarbonate 150 mEq in sterile water 1,150 mL infusion 125 mL/hr at 03/24/23 2338   Scheduled Medications:  acetaminophen  1,000 mg Oral Q6H   amLODipine  10 mg Oral Daily   atorvastatin  40 mg Oral QHS   carvedilol  25 mg Oral BID WC   Chlorhexidine Gluconate Cloth  6 each Topical Q0600   feeding supplement (NEPRO CARB STEADY)  237 mL Oral BID BM   polyethylene glycol  17 g Oral Daily   rivaroxaban  10 mg Oral Daily   senna-docusate  1 tablet Oral BID   I have reviewed scheduled and prn medications.  Physical Exam: Constitutional:Resting comfortably, in no acute distress. Cardio:RRR Pulm:CTA bilaterally Abdomen:SNDNT ZO:XWRUE catheter in place with red-tinged urine in bag. No CVA tenderness.  ZOX:WRUEAVWU for extremity edema. Skin:Warm and dry. Neuro:Alert and oriented x3. No focal deficit noted.

## 2023-03-25 NOTE — Hospital Course (Addendum)
6/8 Appetite is good Has not had a bowel movement but continues to have good urine output No fevers, chills *document neuro exam, 5/5 strength throughout   6/10 -tolerated biopsy well today -minimal pain   6/11 -pain is better -BM yesterday and was normal -appetite is good -denies weakness or lightheaded -denies SOB or numbness/tingling of extremities  -right foot swelling??? -FMLA ***

## 2023-03-25 NOTE — Progress Notes (Signed)
Patient ID: Carlos Martin, male   DOB: Jul 06, 1956, 67 y.o.   MRN: [id number]    Subjective: Pt without new complaints.  Received degarelix 240 mg yesterday to begin treatment for metastatic prostate cancer.  Objective: Vital signs in last 24 hours: Temp:  [98.1 F (36.7 C)-98.4 F (36.9 C)] 98.4 F (36.9 C) (06/07 1633) Pulse Rate:  [72-80] 78 (06/07 1633) Resp:  [16-17] 16 (06/07 1633) BP: (128-146)/(64-76) 128/64 (06/07 1633) SpO2:  [98 %-100 %] 98 % (06/07 1633)  Intake/Output from previous day: 06/06 0701 - 06/07 0700 In: 340 [P.O.:340] Out: 3550 [Urine:3550] Intake/Output this shift: No intake/output data recorded.  Physical Exam:  General: Alert and oriented GU: Urine clear  Lab Results: Recent Labs    03/23/23 0504 03/24/23 0816 03/25/23 0824  HGB 5.9* 8.2* 8.6*  HCT 18.7* 25.0* 25.7*   BMET Recent Labs    03/24/23 1316 03/25/23 0824  NA 133* 136  K 5.1 4.6  CL 106 101  CO2 16* 21*  GLUCOSE 92 86  BUN 26* 26*  CREATININE 2.93* 2.79*  CALCIUM 9.1 9.2     Studies/Results: US RENAL  Result Date: 03/25/2023 CLINICAL DATA:  Hydronephrosis EXAM: RENAL / URINARY TRACT ULTRASOUND COMPLETE COMPARISON:  Renal stone protocol CT 03/23/2023 FINDINGS: Right Kidney: Renal measurements: 10.2 x 5.6 x 6.5 cm = volume: 197 mL. Echogenicity within normal limits. Mild to moderate right hydronephrosis is unchanged since prior CT. Left Kidney: Renal measurements: 12.4 x 7.0 x 5.7 cm = volume: 260 mL. Echogenicity within normal limits. Mild left hydronephrosis is unchanged from prior exam. Bladder: Collapsed around Foley catheter balloon. Other: None. IMPRESSION: Unchanged bilateral hydronephrosis. Electronically Signed   By: Acquanetta Belling M.D.   On: 03/25/2023 10:27   NM Bone Scan Whole Body  Result Date: 03/24/2023 CLINICAL DATA:  Prostate cancer. EXAM: NUCLEAR MEDICINE WHOLE BODY BONE SCAN TECHNIQUE: Whole body anterior and posterior images were obtained approximately 3 hours  after intravenous injection of radiopharmaceutical. RADIOPHARMACEUTICALS:  21.8 mCi Technetium-64m MDP IV COMPARISON:  MRI spine 03/23/2023.  Abdomen pelvis CT 03/23/2019 FINDINGS: Physiologic distribution of tracer along the kidneys and bladder. There are areas of degenerative type uptake identified along the shoulders, spine and sacroiliac regions. However there is multifocal areas of osseous metastatic disease. This includes along both proximal femurs, scattered along the pelvis more right-sided than left, multiple bilateral ribs, scapula sternum and thoracolumbar spine. Some areas as well along the lower cervical spine. IMPRESSION: Multifocal osseous metastatic disease. Areas include numerous bilateral ribs, sternum, scapula, spine, pelvis and proximal femurs Electronically Signed   By: Karen Kays M.D.   On: 03/24/2023 15:36   MR CERVICAL SPINE W WO CONTRAST  Result Date: 03/24/2023 CLINICAL DATA:  Paraspinal mass/tumor, cervical spine Epidural Expansion of tumor at c5. EXAM: MRI CERVICAL SPINE WITHOUT AND WITH CONTRAST TECHNIQUE: Multiplanar and multiecho pulse sequences of the cervical spine, to include the craniocervical junction and cervicothoracic junction, were obtained without and with intravenous contrast. CONTRAST:  8mL GADAVIST GADOBUTROL 1 MMOL/ML IV SOLN COMPARISON:  MRI thoracic spine 03/23/2023. FINDINGS: Alignment: Normal. Vertebrae: Diffuse marrow replacing, enhancing disease throughout the cervical spine, greatest from C4-C6. Ventral epidural extension of tumor from C4-C6, extending into the right neural foramen at C4-5 (axial image 22 series 10) and left neural foramen C5-6 (axial image 27 series 10). Bowing of the posterior cortex at C5 on left results deformity of the left ventral cord with associated T2 hyperintensity, favored to reflect edema (axial image 26 series 6).  Cord: As above. Posterior Fossa, vertebral arteries, paraspinal tissues: Diffuse marrow replacing, enhancing disease  in the clivus and occipital condyles without evidence of epidural extension. Disc levels: C2-C3: No disc herniation or spinal canal stenosis. Facet arthropathy and uncovertebral joint spurring results in moderate bilateral neural foraminal narrowing. C3-C4: Small disc bulge without spinal canal stenosis. Right-greater-than-left facet arthropathy and uncovertebral joint spurring results in severe right neural foraminal narrowing. C4-C5: Ventral epidural and perineural extension of tumor along the right C5 nerve and C4-5 neural foramen results in mild deformity of the right ventral cord and severe right neural foraminal narrowing. Posterolateral bowing of the C5 vertebral body cortex results in moderate left neural foraminal narrowing. C5-C6: Posterolateral bowing of the C5 posterior cortex results in moderate left neural foraminal narrowing and deformity of the left ventral cord with associated T2 hyperintensity, as above. C6-C7: Disc bulge results in mild spinal canal stenosis. Facet arthropathy and uncovertebral joint spurring contribute to moderate bilateral neural foraminal narrowing. C7-T1: No disc herniation, spinal canal stenosis or neural foraminal narrowing. Mild left facet arthropathy. IMPRESSION: 1. Diffuse marrow replacing, enhancing disease throughout the cervical spine, greatest from C4-C6. 2. Ventral epidural and perineural extension of tumor from C4-C6, extending into the right neural foramen at C4-5 and left neural foramen at C5-6. 3. Ventral epidural and perineural extension of tumor along the right C5 nerve and C4-5 neural foramen results in mild deformity of the right ventral cord and severe right neural foraminal narrowing. 4. Posterolateral bowing of the C5 vertebral body cortex results in moderate left neural foraminal narrowing at C4-5 and C5-6, as well as deformity of the left ventral cord with edema at the C5-6 level. 5. Diffuse marrow replacing, enhancing disease in the clivus and occipital  condyles without evidence of epidural extension. 6. Multilevel cervical spondylosis, as described above. Electronically Signed   By: Orvan Falconer M.D.   On: 03/24/2023 09:38    Assessment/Plan: 1) AKI/hydronephrosis: Likely due to bladder outlet obstruction now relieved with urethral catheter.  Urine output consistent with postobstructive diuresis.  Monitor electrolytes.  Renal function slowly improving and suggests chronic obstruction.  Renal ultrasound not suprisingly showed persistent hydronephrosis again indicating chronicity of obstruction but there could be a component of ureteral obstruction as well.  Will consider repeat imaging next week.  2) Metastatic prostate cancer:  Pt almost certainly has metastatic prostate cancer.  He has been ordered to get a CT guided biopsy of bone lesions to confirm diagnosis.  This would be ideal to expedite his diagnosis an avoid an outpatient prostate biopsy.  He started androgen deprivation 6/6 with degarelix. He will need oncology follow up once definitive diagnosis is made to continue androgen deprivation along with therapy escalation.    3) Urinary retention:  Due to locally advanced prostate cancer.  Will likely need a channel TURP once he is stabilized from an oncology and renal standpoint.  He will need to be discharged with his catheter and I will arrange outpatient follow up for this purpose.   LOS: 2 days   [name] 03/25/2023, 5:49 PM

## 2023-03-26 DIAGNOSIS — N179 Acute kidney failure, unspecified: Secondary | ICD-10-CM | POA: Diagnosis not present

## 2023-03-26 DIAGNOSIS — N4289 Other specified disorders of prostate: Secondary | ICD-10-CM | POA: Diagnosis not present

## 2023-03-26 DIAGNOSIS — N138 Other obstructive and reflux uropathy: Secondary | ICD-10-CM | POA: Diagnosis not present

## 2023-03-26 DIAGNOSIS — N1330 Unspecified hydronephrosis: Secondary | ICD-10-CM | POA: Diagnosis not present

## 2023-03-26 LAB — RENAL FUNCTION PANEL
Albumin: 2.6 g/dL — ABNORMAL LOW (ref 3.5–5.0)
Anion gap: 11 (ref 5–15)
BUN: 33 mg/dL — ABNORMAL HIGH (ref 8–23)
CO2: 25 mmol/L (ref 22–32)
Calcium: 8.5 mg/dL — ABNORMAL LOW (ref 8.9–10.3)
Chloride: 99 mmol/L (ref 98–111)
Creatinine, Ser: 2.91 mg/dL — ABNORMAL HIGH (ref 0.61–1.24)
GFR, Estimated: 23 mL/min — ABNORMAL LOW (ref >60)
Glucose, Bld: 136 mg/dL — ABNORMAL HIGH (ref 70–99)
Phosphorus: 5.1 mg/dL — ABNORMAL HIGH (ref 2.5–4.6)
Potassium: 4.4 mmol/L (ref 3.5–5.1)
Sodium: 135 mmol/L (ref 135–145)

## 2023-03-26 LAB — CBC
HCT: 22.7 % — ABNORMAL LOW (ref 39.0–52.0)
Hemoglobin: 7.8 g/dL — ABNORMAL LOW (ref 13.0–17.0)
MCH: 24.6 pg — ABNORMAL LOW (ref 26.0–34.0)
MCHC: 34.4 g/dL (ref 30.0–36.0)
MCV: 71.6 fL — ABNORMAL LOW (ref 80.0–100.0)
Platelets: 329 10*3/uL (ref 150–400)
RBC: 3.17 MIL/uL — ABNORMAL LOW (ref 4.22–5.81)
RDW: 20.4 % — ABNORMAL HIGH (ref 11.5–15.5)
WBC: 8.9 10*3/uL (ref 4.0–10.5)
nRBC: 0 % (ref 0.0–0.2)

## 2023-03-26 LAB — GLUCOSE, CAPILLARY
Glucose-Capillary: 109 mg/dL — ABNORMAL HIGH (ref 70–99)
Glucose-Capillary: 107 mg/dL — ABNORMAL HIGH (ref 70–99)
Glucose-Capillary: 167 mg/dL — ABNORMAL HIGH (ref 70–99)

## 2023-03-26 NOTE — Progress Notes (Signed)
Assessment/ Plan: 67 year old male with past medical history significant for hypertension, T2DM, HLD presented with several weeks of back pain, decreased urine output, seen as a consultation for the management of AKI and hyperkalemia.  1. AKI: 2/2 obstructive uropathy from metastatic prostate cancer causing urinary outlet obstruction and bilateral hydronephrosis. Foley catheter in place, had 3550/2100mL UOP over past 48 hrs w/ postobstructive diuresis. Continues to have mild but improved suprapubic discomfort. Renal function slowly improving. Foley with red-tinted urine. Off HCO3 gtt. Cont to trend renal function. Avoid nephrotoxic medications, continue to hold home ARB and aldactone.  - No recent labs and unclear how long obstructive uropathy has been present for. Fortunately renal function is not worsening but not improving as quickly as I would like. I'm concerned there is either a component of ATN or significant CKD; only time will tell. Certainly will need CKA f/u in a few weeks upon d/c.   2.Electrolyte derangements: BMP with hyponatremia 136 and hyperkalemia 4.6. Hyperkalemia likely 2/2 AKI, complicated possibly by blood transfusion 06/05.   3.Non-anion gap metabolic acidosis: Bicarbonate 25. Off sodium bicarbonate. 4.Hypertension/volume: Remains mildly hypertensive.Home amlodipine and carvedilol restarted by primary team on admission. No concern for volume overload at this time. Per primary. 5.Anemia: In the setting of active malignancy. Improved with PRBC transfusion on admission. Microcytic, Hgb 8.2 and MCV 73.1. Iron studies with iron 46, TIBC 269, saturation 17%, ferritin 129. Total body iron deficit of 1286 mg. Will give ferrlecit 250 mg IV daily x 3 days. Trend CBC, transfuse for Hgb <7. 6. Metastatic prostate cancer: Urology following for management and recommendations. Not sure if he and his wife understand the significance of his cancer. Encouraged him to ask any questions that he may  have.  Subjective:  Patient states that he feels okay this morning. Walking is uncomfortable due to foley catheter. Denies f/c/n/v/sob. Good appetite.   Objective Vital signs in last 24 hours: Vitals:   03/25/23 2144 03/26/23 0445 03/26/23 0745 03/26/23 1100  BP: (!) 146/77 (!) 142/73 (!) 171/76 (!) 140/78  Pulse: 76 76 85 75  Resp: 17 18 17 16   Temp: 97.9 F (36.6 C) 98.6 F (37 C) 98.1 F (36.7 C) 98.2 F (36.8 C)  TempSrc: Oral Oral  Oral  SpO2: 99% 98% 98% 99%  Weight:      Height:       Weight change:   Intake/Output Summary (Last 24 hours) at 03/26/2023 1245 Last data filed at 03/26/2023 0459 Gross per 24 hour  Intake 558 ml  Output 2100 ml  Net -1542 ml       Labs: Basic Metabolic Panel: Recent Labs  Lab 03/24/23 0816 03/24/23 1316 03/25/23 0824 03/26/23 0051  NA 133* 133* 136 135  K 6.3* 5.1 4.6 4.4  CL 106 106 101 99  CO2 16* 16* 21* 25  GLUCOSE 98 92 86 136*  BUN 24* 26* 26* 33*  CREATININE 2.94* 2.93* 2.79* 2.91*  CALCIUM 9.0 9.1 9.2 8.5*  PHOS 4.4  --  4.7* 5.1*   Liver Function Tests: Recent Labs  Lab 03/23/23 2137 03/24/23 0816 03/25/23 0824 03/26/23 0051  AST 18  --   --   --   ALT 10  --   --   --   ALKPHOS 65  --   --   --   BILITOT 0.5  --   --   --   PROT 7.1  --   --   --   ALBUMIN 2.7*  2.7*  2.8* 2.8* 2.6*   No results for input(s): "LIPASE", "AMYLASE" in the last 168 hours. No results for input(s): "AMMONIA" in the last 168 hours.  CBC: Recent Labs  Lab 03/23/23 0504 03/24/23 0816 03/25/23 0824 03/26/23 0051  WBC 9.8 8.7 9.0 8.9  HGB 5.9* 8.2* 8.6* 7.8*  HCT 18.7* 25.0* 25.7* 22.7*  MCV 68.2* 73.1* 71.4* 71.6*  PLT 411* 298 400 329   Cardiac Enzymes: Recent Labs  Lab 03/23/23 2137  CKTOTAL 123   CBG: Recent Labs  Lab 03/25/23 1158 03/25/23 1713 03/25/23 2224 03/26/23 0743 03/26/23 1204  GLUCAP 165* 167* 175* 109* 107*    Iron Studies:  Recent Labs    03/23/23 2137  IRON 46  TIBC 269  FERRITIN  129   Studies/Results: US RENAL  Result Date: 03/25/2023 CLINICAL DATA:  Hydronephrosis EXAM: RENAL / URINARY TRACT ULTRASOUND COMPLETE COMPARISON:  Renal stone protocol CT 03/23/2023 FINDINGS: Right Kidney: Renal measurements: 10.2 x 5.6 x 6.5 cm = volume: 197 mL. Echogenicity within normal limits. Mild to moderate right hydronephrosis is unchanged since prior CT. Left Kidney: Renal measurements: 12.4 x 7.0 x 5.7 cm = volume: 260 mL. Echogenicity within normal limits. Mild left hydronephrosis is unchanged from prior exam. Bladder: Collapsed around Foley catheter balloon. Other: None. IMPRESSION: Unchanged bilateral hydronephrosis. Electronically Signed   By: Acquanetta Belling M.D.   On: 03/25/2023 10:27   NM Bone Scan Whole Body  Result Date: 03/24/2023 CLINICAL DATA:  Prostate cancer. EXAM: NUCLEAR MEDICINE WHOLE BODY BONE SCAN TECHNIQUE: Whole body anterior and posterior images were obtained approximately 3 hours after intravenous injection of radiopharmaceutical. RADIOPHARMACEUTICALS:  21.8 mCi Technetium-25m MDP IV COMPARISON:  MRI spine 03/23/2023.  Abdomen pelvis CT 03/23/2019 FINDINGS: Physiologic distribution of tracer along the kidneys and bladder. There are areas of degenerative type uptake identified along the shoulders, spine and sacroiliac regions. However there is multifocal areas of osseous metastatic disease. This includes along both proximal femurs, scattered along the pelvis more right-sided than left, multiple bilateral ribs, scapula sternum and thoracolumbar spine. Some areas as well along the lower cervical spine. IMPRESSION: Multifocal osseous metastatic disease. Areas include numerous bilateral ribs, sternum, scapula, spine, pelvis and proximal femurs Electronically Signed   By: Karen Kays M.D.   On: 03/24/2023 15:36    Medications: Infusions:  ferric gluconate (FERRLECIT) IVPB 250 mg (03/25/23 0834)   Scheduled Medications:  acetaminophen  1,000 mg Oral Q6H   amLODipine  10 mg  Oral Daily   atorvastatin  40 mg Oral QHS   carvedilol  25 mg Oral BID WC   Chlorhexidine Gluconate Cloth  6 each Topical Q0600   feeding supplement (NEPRO CARB STEADY)  237 mL Oral BID BM   polyethylene glycol  17 g Oral Daily   rivaroxaban  10 mg Oral Daily   senna-docusate  1 tablet Oral BID   I have reviewed scheduled and prn medications.  Physical Exam: Constitutional:Resting comfortably, in no acute distress. Cardio:RRR Pulm:CTA bilaterally Abdomen:SNDNT ZO:XWRUE catheter in place with red-tinged urine in bag. No CVA tenderness. AVW:[id number] for extremity edema. Skin:Warm and dry. Neuro:Alert and oriented x3. No focal deficit noted.

## 2023-03-26 NOTE — Progress Notes (Addendum)
HD#3 Subjective:   Summary: This is a 67 year old male with a past medical history of hypertension, type 2 diabetes who presents for chronic back pain urinary retention. Imaging revealed suspicious prostate mass as well as bilateral hydronephrosis.  Patient admitted for post renal AKI in the setting of obstruction secondary to suspected prostatic cancer.  Overnight Events: No acute overnight events   Patient evaluated bedside today.  Patient joined with his wife.  They state they are doing well.  Patient reports his back pain is well-controlled.  He reports that he is no longer having abdominal pain.  He states he is still not had a bowel movement.  He states he is eating well.  He states he would like to eat today.  He denies any fevers or chills.  He otherwise is doing well.  Patient evaluated bedside today.  He states his pain is better.  He states he is doing well.  He does seem to understand that this is cancer.  Objective:  Vital signs in last 24 hours: Vitals:   03/25/23 2144 03/26/23 0445 03/26/23 0745 03/26/23 1100  BP: (!) 146/77 (!) 142/73 (!) 171/76 (!) 140/78  Pulse: 76 76 85 75  Resp: 17 18 17 16   Temp: 97.9 F (36.6 C) 98.6 F (37 C) 98.1 F (36.7 C) 98.2 F (36.8 C)  TempSrc: Oral Oral  Oral  SpO2: 99% 98% 98% 99%  Weight:      Height:       Supplemental O2: Room Air SpO2: 99 %   Physical Exam:  Constitutional: Resting in bed, no acute distress HENT: normocephalic atraumatic Cardiovascular: regular rate and rhythm, no m/r/g Pulmonary/Chest: normal work of breathing on room air, lungs clear to auscultation bilaterally Abdominal: soft, non-tender, non-distended GU: Foley draining well, with yellow-colored urine now MSK: 5/5 strength noted to bilateral upper and lower extremities.  No sensation deficits.  Filed Weights   03/23/23 0456  Weight: 86.2 kg     Intake/Output Summary (Last 24 hours) at 03/26/2023 1347 Last data filed at 03/26/2023 0459 Gross per  24 hour  Intake 558 ml  Output 2100 ml  Net -1542 ml   Net IO Since Admission: -5,722 mL [03/26/23 1347]  Pertinent Labs:    Latest Ref Rng & Units 03/26/2023   12:51 AM 03/25/2023    8:24 AM 03/24/2023    8:16 AM  CBC  WBC 4.0 - 10.5 K/uL 8.9  9.0  8.7   Hemoglobin 13.0 - 17.0 g/dL 7.8  8.6  8.2   Hematocrit 39.0 - 52.0 % 22.7  25.7  25.0   Platelets 150 - 400 K/uL 329  400  298        Latest Ref Rng & Units 03/26/2023   12:51 AM 03/25/2023    8:24 AM 03/24/2023    1:16 PM  CMP  Glucose 70 - 99 mg/dL 161  86  92   BUN 8 - 23 mg/dL 33  26  26   Creatinine 0.61 - 1.24 mg/dL 0.96  0.45  4.09   Sodium 135 - 145 mmol/L 135  136  133   Potassium 3.5 - 5.1 mmol/L 4.4  4.6  5.1   Chloride 98 - 111 mmol/L 99  101  106   CO2 22 - 32 mmol/L 25  21  16    Calcium 8.9 - 10.3 mg/dL 8.5  9.2  9.1     Imaging: No results found.  Assessment/Plan:   Principal Problem:  AKI (acute kidney injury) (HCC) Active Problems:   Hyperkalemia   Microcytic anemia   Metabolic acidosis, normal anion gap (NAG)   Essential hypertension, benign   Patient Summary: This is a 67 year old male with a past medical history of hypertension, type 2 diabetes who presents for chronic back pain urinary retention. Imaging revealed suspicious prostate mass as well as bilateral hydronephrosis.  Patient admitted for post renal AKI in the setting of obstruction secondary to suspected prostate cancer.  #Postrenal AKI 2/2 obstructive uropathy  #Bilateral hydronephrosis secondary to obstruction from suspected metastatic prostate cancer Patient evaluated bedside this morning.  Patient pain is well-controlled.  Spoke with IR today, plan will be to biopsy early next week.  They do not do biopsies during the weekend.  Will continue with Foley catheter as well as pain control.  Plan at start of the week to get biopsy as well as repeat renal ultrasound.  Patient's creatinine is stable at this time. My hope is that we will continue  to decrease.  If patient's creatinine does bump up significantly and start trending upwards, will need to be concerned about ureteral obstruction and potentially need a percutaneous nephrostomy tube placed.   -Urology following -Nephrology following -Pain control with Dilaudid and Tylenol -Patient received Firmagon 240 mg injection 03/24/2023 -Consulted IR for biopsy from bone lesion, plan to do biopsy of bone lesion at the start of the week -Continue to monitor kidney function -Renal ultrasound at the start of next week  #Hyperkalemia, resolved Patient's potassium has decreased well.  Sodium bicarb infusion as well as Lokelma has helped well.  Will discontinue sodium bicarb infusion. -Continue follow-up BMP -Discontinue bicarb infusion today  #Anemia of chronic disease Patient's hemoglobin down to 7.8 today.  Will continue to monitor.  He denies any bleeding today. 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  Pain is well-controlled at this time.  Did do strength testing today of upper extremities as well as lower extremities.  No sensation deficits or strength deficits appreciated.  Will continue to monitor for worsening neurological signs. -Continue to monitor for worsening neurological signs -Continue pain control with Dilaudid and Tylenol  #NAGMA, resolved Bicarb up to 25.  Will discontinue sodium bicarb infusion. -Discontinue sodium bicarb infusion  #Severe spinal stenosis T9-T11 Stable at this time, patient not complaining of any weakness, saddle anesthesia, or any other neurological issues.  Did do full strength testing for upper and lower extremities.  Patient has great strength and great sensation bilaterally upper and lower extremities. -Continue pain control with Dilaudid and Tylenol -Continue to monitor for worsening neurological symptoms.  #Hypertension Blood pressure measuring well. -Continue  amlodipine 10 mg daily -Continue carvedilol 25 mg twice daily -Hold home spironolactone in the setting of AKI -Hold home losartan 100 mg in the setting of AKI  #Type 2 diabetes Glucose measuring well during hospitalization.  Hold home metformin. -Will discontinue CBGs  #Hyperlipidemia -Continue atorvastatin 40 mg daily  Diet: Normal IVF: N/A VTE: DOAC Code: Full  Dispo: Anticipated discharge to Home in 3 days pending clinical improvement.   Modena Slater DO Internal Medicine Resident PGY-1 [phone] Please contact the on call pager after 5 pm and on weekends at [phone].

## 2023-03-26 NOTE — Consult Note (Signed)
Chief Complaint: Patient was seen in consultation today for bone lesion biopsy   Referring Physician(s): Dr. Modena Slater Dr. Laverle Patter  Supervising Physician: Gilmer Mor  Patient Status: [facility] - In-pt  History of Present Illness: Carlos Martin is a 67 y.o. male with prostate cancer admitted with AKI, generalized weakness and skeletal pains. His workup finds multiple osseous bone lesions.  IR is asked to eval for biopsy.  PMHx, meds, labs, imaging, allergies reviewed. Feels a little better today Family at bedside.   Past Medical History:  Diagnosis Date   Hypertension     History reviewed. No pertinent surgical history.  Allergies: Patient has no known allergies.  Medications:  Current Facility-Administered Medications:    acetaminophen (TYLENOL) tablet 1,000 mg, 1,000 mg, Oral, Q6H, 1,000 mg at 03/26/23 1100 **OR** [DISCONTINUED] acetaminophen (TYLENOL) suppository 650 mg, 650 mg, Rectal, Q6H PRN, Atway, Rayann N, DO   amLODipine (NORVASC) tablet 10 mg, 10 mg, Oral, Daily, Atway, Rayann N, DO, 10 mg at 03/26/23 1100   atorvastatin (LIPITOR) tablet 40 mg, 40 mg, Oral, QHS, Atway, Rayann N, DO, 40 mg at 03/25/23 2222   carvedilol (COREG) tablet 25 mg, 25 mg, Oral, BID WC, Atway, Rayann N, DO, 25 mg at 03/26/23 1100   Chlorhexidine Gluconate Cloth 2 % PADS 6 each, 6 each, Topical, Q0600, Earl Lagos, MD, 6 each at 03/26/23 0458   feeding supplement (NEPRO CARB STEADY) liquid 237 mL, 237 mL, Oral, BID BM, Narendra, Nischal, MD, 237 mL at 03/25/23 1544   ferric gluconate (FERRLECIT) 250 mg in sodium chloride 0.9 % 250 mL IVPB, 250 mg, Intravenous, Daily, Champ Mungo, DO, Last Rate: 135 mL/hr at 03/25/23 0834, 250 mg at 03/25/23 0834   HYDROmorphone (DILAUDID) injection 1 mg, 1 mg, Intravenous, Q4H PRN, Atway, Rayann N, DO, 1 mg at 03/26/23 0745   hyoscyamine (LEVSIN SL) SL tablet 0.25 mg, 0.25 mg, Sublingual, Q6H PRN, Narendra, Nischal, MD   polyethylene glycol  (MIRALAX / GLYCOLAX) packet 17 g, 17 g, Oral, Daily, Rana Snare, DO, 17 g at 03/26/23 1100   rivaroxaban (XARELTO) tablet 10 mg, 10 mg, Oral, Daily, Patel, Amar, DO, 10 mg at 03/26/23 1100   senna-docusate (Senokot-S) tablet 1 tablet, 1 tablet, Oral, BID, Rana Snare, DO, 1 tablet at 03/26/23 1100    History reviewed. No pertinent family history.  Social History   Socioeconomic History   Marital status: Married    Spouse name: Not on file   Number of children: Not on file   Years of education: Not on file   Highest education level: Not on file  Occupational History   Not on file  Tobacco Use   Smoking status: Never   Smokeless tobacco: Not on file  Substance and Sexual Activity   Alcohol use: No   Drug use: No   Sexual activity: Not on file  Other Topics Concern   Not on file  Social History Narrative   Not on file   Social Determinants of Health   Financial Resource Strain: Not on file  Food Insecurity: No Food Insecurity (03/23/2023)   Hunger Vital Sign    Worried About Running Out of Food in the Last Year: Never true    Ran Out of Food in the Last Year: Never true  Transportation Needs: No Transportation Needs (03/23/2023)   PRAPARE - Administrator, Civil Service (Medical): No    Lack of Transportation (Non-Medical): No  Physical Activity: Not on file  Stress: Not on  file  Social Connections: Not on file    Review of Systems: A 12 point ROS discussed and pertinent positives are indicated in the HPI above.  All other systems are negative.  Review of Systems  Vital Signs: BP (!) 140/78 (BP Location: Right Arm)   Pulse 75   Temp 98.2 F (36.8 C) (Oral)   Resp 16   Ht 5\' 9"  (1.753 m)   Wt 190 lb (86.2 kg)   SpO2 99%   BMI 28.06 kg/m   Physical Exam Constitutional:      Appearance: He is not ill-appearing.  HENT:     Mouth/Throat:     Mouth: Mucous membranes are moist.     Pharynx: Oropharynx is clear.  Cardiovascular:     Rate and  Rhythm: Normal rate and regular rhythm.     Heart sounds: Normal heart sounds.  Pulmonary:     Effort: Pulmonary effort is normal. No respiratory distress.     Breath sounds: Normal breath sounds.  Skin:    General: Skin is warm and dry.  Neurological:     General: No focal deficit present.     Mental Status: He is alert and oriented to person, place, and time.  Psychiatric:        Mood and Affect: Mood normal.        Thought Content: Thought content normal.        Judgment: Judgment normal.     Imaging: US RENAL  Result Date: 03/25/2023 CLINICAL DATA:  Hydronephrosis EXAM: RENAL / URINARY TRACT ULTRASOUND COMPLETE COMPARISON:  Renal stone protocol CT 03/23/2023 FINDINGS: Right Kidney: Renal measurements: 10.2 x 5.6 x 6.5 cm = volume: 197 mL. Echogenicity within normal limits. Mild to moderate right hydronephrosis is unchanged since prior CT. Left Kidney: Renal measurements: 12.4 x 7.0 x 5.7 cm = volume: 260 mL. Echogenicity within normal limits. Mild left hydronephrosis is unchanged from prior exam. Bladder: Collapsed around Foley catheter balloon. Other: None. IMPRESSION: Unchanged bilateral hydronephrosis. Electronically Signed   By: Acquanetta Belling M.D.   On: 03/25/2023 10:27   NM Bone Scan Whole Body  Result Date: 03/24/2023 CLINICAL DATA:  Prostate cancer. EXAM: NUCLEAR MEDICINE WHOLE BODY BONE SCAN TECHNIQUE: Whole body anterior and posterior images were obtained approximately 3 hours after intravenous injection of radiopharmaceutical. RADIOPHARMACEUTICALS:  21.8 mCi Technetium-44m MDP IV COMPARISON:  MRI spine 03/23/2023.  Abdomen pelvis CT 03/23/2019 FINDINGS: Physiologic distribution of tracer along the kidneys and bladder. There are areas of degenerative type uptake identified along the shoulders, spine and sacroiliac regions. However there is multifocal areas of osseous metastatic disease. This includes along both proximal femurs, scattered along the pelvis more right-sided than left,  multiple bilateral ribs, scapula sternum and thoracolumbar spine. Some areas as well along the lower cervical spine. IMPRESSION: Multifocal osseous metastatic disease. Areas include numerous bilateral ribs, sternum, scapula, spine, pelvis and proximal femurs Electronically Signed   By: Karen Kays M.D.   On: 03/24/2023 15:36   MR CERVICAL SPINE W WO CONTRAST  Result Date: 03/24/2023 CLINICAL DATA:  Paraspinal mass/tumor, cervical spine Epidural Expansion of tumor at c5. EXAM: MRI CERVICAL SPINE WITHOUT AND WITH CONTRAST TECHNIQUE: Multiplanar and multiecho pulse sequences of the cervical spine, to include the craniocervical junction and cervicothoracic junction, were obtained without and with intravenous contrast. CONTRAST:  8mL GADAVIST GADOBUTROL 1 MMOL/ML IV SOLN COMPARISON:  MRI thoracic spine 03/23/2023. FINDINGS: Alignment: Normal. Vertebrae: Diffuse marrow replacing, enhancing disease throughout the cervical spine, greatest from C4-C6. Ventral  epidural extension of tumor from C4-C6, extending into the right neural foramen at C4-5 (axial image 22 series 10) and left neural foramen C5-6 (axial image 27 series 10). Bowing of the posterior cortex at C5 on left results deformity of the left ventral cord with associated T2 hyperintensity, favored to reflect edema (axial image 26 series 6). Cord: As above. Posterior Fossa, vertebral arteries, paraspinal tissues: Diffuse marrow replacing, enhancing disease in the clivus and occipital condyles without evidence of epidural extension. Disc levels: C2-C3: No disc herniation or spinal canal stenosis. Facet arthropathy and uncovertebral joint spurring results in moderate bilateral neural foraminal narrowing. C3-C4: Small disc bulge without spinal canal stenosis. Right-greater-than-left facet arthropathy and uncovertebral joint spurring results in severe right neural foraminal narrowing. C4-C5: Ventral epidural and perineural extension of tumor along the right C5 nerve  and C4-5 neural foramen results in mild deformity of the right ventral cord and severe right neural foraminal narrowing. Posterolateral bowing of the C5 vertebral body cortex results in moderate left neural foraminal narrowing. C5-C6: Posterolateral bowing of the C5 posterior cortex results in moderate left neural foraminal narrowing and deformity of the left ventral cord with associated T2 hyperintensity, as above. C6-C7: Disc bulge results in mild spinal canal stenosis. Facet arthropathy and uncovertebral joint spurring contribute to moderate bilateral neural foraminal narrowing. C7-T1: No disc herniation, spinal canal stenosis or neural foraminal narrowing. Mild left facet arthropathy. IMPRESSION: 1. Diffuse marrow replacing, enhancing disease throughout the cervical spine, greatest from C4-C6. 2. Ventral epidural and perineural extension of tumor from C4-C6, extending into the right neural foramen at C4-5 and left neural foramen at C5-6. 3. Ventral epidural and perineural extension of tumor along the right C5 nerve and C4-5 neural foramen results in mild deformity of the right ventral cord and severe right neural foraminal narrowing. 4. Posterolateral bowing of the C5 vertebral body cortex results in moderate left neural foraminal narrowing at C4-5 and C5-6, as well as deformity of the left ventral cord with edema at the C5-6 level. 5. Diffuse marrow replacing, enhancing disease in the clivus and occipital condyles without evidence of epidural extension. 6. Multilevel cervical spondylosis, as described above. Electronically Signed   By: Orvan Falconer M.D.   On: 03/24/2023 09:38   MR THORACIC SPINE W WO CONTRAST  Result Date: 03/23/2023 CLINICAL DATA:  Metastatic disease evaluation EXAM: MRI THORACIC AND LUMBAR SPINE WITHOUT AND WITH CONTRAST TECHNIQUE: Multiplanar and multiecho pulse sequences of the thoracic and lumbar spine were obtained without and with intravenous contrast. CONTRAST:  8mL GADAVIST  GADOBUTROL 1 MMOL/ML IV SOLN COMPARISON:  Same day CT renal stone protocol FINDINGS: MRI THORACIC SPINE FINDINGS Alignment:  Physiologic. Vertebrae: There is diffuse osseous metastatic disease with metastatic lesions at nearly every thoracic vertebral body. There no evidence of a severe pathologic compression deformity. There is evidence of epidural extension of tumor at the T6 level (series 16, image 9), the T8 level (series 17, image 32), T9-T11 levels (series 16, image 11), T12 level (series 17, image 50). Metastatic lesions are also seen throughout the cervical spine with possible epidural extension of tumor at the C5 level. Note these levels are only seen on the counting sequences and are incompletely assessed. Cord: Normal signal and morphology. Paraspinal and other soft tissues: Negative. Disc levels: There is severe spinal canal stenosis at the T9-T11 levels due to the presence of epidural tumor. MRI LUMBAR SPINE FINDINGS Segmentation:  Standard. Alignment:  Grade 1 anterolisthesis of L5 on S1. Vertebrae: Metastatic lesions are  seen at every lumbar and visualized sacral vertebral body Conus medullaris: Extends to the L2-L3 disc space level. There is a fatty filum terminale Paraspinal and other soft tissues: See same day CT Renal Stone protocol exam for additional findings. There is an enlarged right common iliac chain lymph node, worrisome for metastatic disease. Contrast-enhancing lesions are seen in the bilateral iliac bones (series 19, image 39) Disc levels: No evidence of high-grade spinal canal stenosis no evidence of epidural spread of tumor in the lumbar spine IMPRESSION: 1. Diffuse osseous metastatic disease with metastatic lesions at nearly every cervical, thoracic, and lumbar vertebral body. 2. Epidural extension of tumor at the T6, T8, T9-T11, and T12 levels resulting in severe spinal canal stenosis at the T9-T11 levels. There may also be epidural extension of tumor at the C5 level, but this level  is incompletely imaged. Consider further evaluation with a dedicated cervical spine MRI with and without contrast. 3. No evidence of epidural spread of tumor in the lumbar spine. 4. Enlarged right common iliac chain lymph node, worrisome for metastatic disease. 5. Contrast-enhancing lesions in the bilateral iliac bones, worrisome for additional sites of osseous metastatic disease. Electronically Signed   By: Lorenza Cambridge M.D.   On: 03/23/2023 14:01   MR Lumbar Spine W Wo Contrast  Result Date: 03/23/2023 CLINICAL DATA:  Metastatic disease evaluation EXAM: MRI THORACIC AND LUMBAR SPINE WITHOUT AND WITH CONTRAST TECHNIQUE: Multiplanar and multiecho pulse sequences of the thoracic and lumbar spine were obtained without and with intravenous contrast. CONTRAST:  8mL GADAVIST GADOBUTROL 1 MMOL/ML IV SOLN COMPARISON:  Same day CT renal stone protocol FINDINGS: MRI THORACIC SPINE FINDINGS Alignment:  Physiologic. Vertebrae: There is diffuse osseous metastatic disease with metastatic lesions at nearly every thoracic vertebral body. There no evidence of a severe pathologic compression deformity. There is evidence of epidural extension of tumor at the T6 level (series 16, image 9), the T8 level (series 17, image 32), T9-T11 levels (series 16, image 11), T12 level (series 17, image 50). Metastatic lesions are also seen throughout the cervical spine with possible epidural extension of tumor at the C5 level. Note these levels are only seen on the counting sequences and are incompletely assessed. Cord: Normal signal and morphology. Paraspinal and other soft tissues: Negative. Disc levels: There is severe spinal canal stenosis at the T9-T11 levels due to the presence of epidural tumor. MRI LUMBAR SPINE FINDINGS Segmentation:  Standard. Alignment:  Grade 1 anterolisthesis of L5 on S1. Vertebrae: Metastatic lesions are seen at every lumbar and visualized sacral vertebral body Conus medullaris: Extends to the L2-L3 disc space  level. There is a fatty filum terminale Paraspinal and other soft tissues: See same day CT Renal Stone protocol exam for additional findings. There is an enlarged right common iliac chain lymph node, worrisome for metastatic disease. Contrast-enhancing lesions are seen in the bilateral iliac bones (series 19, image 39) Disc levels: No evidence of high-grade spinal canal stenosis no evidence of epidural spread of tumor in the lumbar spine IMPRESSION: 1. Diffuse osseous metastatic disease with metastatic lesions at nearly every cervical, thoracic, and lumbar vertebral body. 2. Epidural extension of tumor at the T6, T8, T9-T11, and T12 levels resulting in severe spinal canal stenosis at the T9-T11 levels. There may also be epidural extension of tumor at the C5 level, but this level is incompletely imaged. Consider further evaluation with a dedicated cervical spine MRI with and without contrast. 3. No evidence of epidural spread of tumor in the lumbar  spine. 4. Enlarged right common iliac chain lymph node, worrisome for metastatic disease. 5. Contrast-enhancing lesions in the bilateral iliac bones, worrisome for additional sites of osseous metastatic disease. Electronically Signed   By: Lorenza Cambridge M.D.   On: 03/23/2023 14:01   CT Renal Stone Study  Result Date: 03/23/2023 CLINICAL DATA:  Abdominal/flank pain with stone suspected EXAM: CT ABDOMEN AND PELVIS WITHOUT CONTRAST TECHNIQUE: Multidetector CT imaging of the abdomen and pelvis was performed following the standard protocol without IV contrast. RADIATION DOSE REDUCTION: This exam was performed according to the departmental dose-optimization program which includes automated exposure control, adjustment of the mA and/or kV according to patient size and/or use of iterative reconstruction technique. COMPARISON:  None Available. FINDINGS: Lower chest: Posterior mediastinal nodularity centered on the affected vertebrae. Hepatobiliary: No focal liver abnormality.No  evidence of biliary obstruction or stone. Pancreas: Unremarkable. Spleen: Unremarkable. Adrenals/Urinary Tract: Negative adrenals. Bilateral hydroureteronephrosis to the level of the distended urinary bladder which shows extensive masslike infiltration at its base, contiguous with severe masslike enlargement of the prostate. Stomach/Bowel: Indistinguishable fat plane between the prostate and anterior wall of the rectum. No bowel obstruction Vascular/Lymphatic: Pelvic lymphadenopathy and pelvic fat infiltration by the prostate cancer bilaterally. Nodularity at the aortic hiatus, likely adenopathy. Reproductive:Bulky masslike enlargement of the prostate invading the bladder base and pelvic fat, up to 13 cm anterior to posterior. Other: No ascites or pneumoperitoneum. Musculoskeletal: Diffuse heterogeneous spine attributed to metastatic disease. 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 Extraosseous tumor growth at least at lower thoracic levels into the posterior mediastinum, suspect epidural infiltration at T11. IMPRESSION: Urinary outlet obstruction with bilateral hydronephrosis due to bulky prostate mass invading the bladder base and pelvic fat. The mass is indistinguishable from the anterior wall of the rectum. There is extensive osseous metastatic disease with suspected epidural tumor at the lower thoracic spine. Electronically Signed   By: Tiburcio Pea M.D.   On: 03/23/2023 07:53    Labs:  CBC: Recent Labs    03/23/23 0504 03/24/23 0816 03/25/23 0824 03/26/23 0051  WBC 9.8 8.7 9.0 8.9  HGB 5.9* 8.2* 8.6* 7.8*  HCT 18.7* 25.0* 25.7* 22.7*  PLT 411* 298 400 329    COAGS: Recent Labs    03/23/23 2137  INR 1.3*    BMP: Recent Labs    03/24/23 0816 03/24/23 1316 03/25/23 0824 03/26/23 0051  NA 133* 133* 136 135  K 6.3* 5.1 4.6 4.4  CL 106 106 101 99  CO2 16* 16* 21* 25  GLUCOSE 98 92 86 136*  BUN 24* 26* 26* 33*  CALCIUM 9.0 9.1 9.2 8.5*  CREATININE 2.94* 2.93* 2.79*  2.91*  GFRNONAA 23* 23* 25* 23*    LIVER FUNCTION TESTS: Recent Labs    03/23/23 2137 03/24/23 0816 03/25/23 0824 03/26/23 0051  BILITOT 0.5  --   --   --   AST 18  --   --   --   ALT 10  --   --   --   ALKPHOS 65  --   --   --   PROT 7.1  --   --   --   ALBUMIN 2.7*  2.7* 2.8* 2.8* 2.6*     Assessment and Plan: Metastatic prostate cancer with multiple osseous lesions. Plan for image guided biopsy of right pelvic bone lesion on Mon 6/10 Risks and benefits of pelvic bone lesion biopsy. was discussed with the patient and/or patient's family including, but not limited to bleeding, infection, damage  to adjacent structures or low yield requiring additional tests.  All of the questions were answered and there is agreement to proceed.  Consent signed     Electronically Signed: Brayton El, PA-C 03/26/2023, 12:01 PM   I spent a total of 20 minutes in face to face in clinical consultation, greater than 50% of which was counseling/coordinating care for pelvic bone lesion biopsy

## 2023-03-27 DIAGNOSIS — N4289 Other specified disorders of prostate: Secondary | ICD-10-CM | POA: Diagnosis not present

## 2023-03-27 DIAGNOSIS — K5900 Constipation, unspecified: Secondary | ICD-10-CM

## 2023-03-27 DIAGNOSIS — N138 Other obstructive and reflux uropathy: Secondary | ICD-10-CM | POA: Diagnosis not present

## 2023-03-27 DIAGNOSIS — N179 Acute kidney failure, unspecified: Secondary | ICD-10-CM | POA: Diagnosis not present

## 2023-03-27 DIAGNOSIS — N1330 Unspecified hydronephrosis: Secondary | ICD-10-CM | POA: Diagnosis not present

## 2023-03-27 LAB — BASIC METABOLIC PANEL
Anion gap: 10 (ref 5–15)
BUN: 36 mg/dL — ABNORMAL HIGH (ref 8–23)
CO2: 23 mmol/L (ref 22–32)
Calcium: 8.4 mg/dL — ABNORMAL LOW (ref 8.9–10.3)
Chloride: 102 mmol/L (ref 98–111)
Creatinine, Ser: 2.81 mg/dL — ABNORMAL HIGH (ref 0.61–1.24)
GFR, Estimated: 24 mL/min — ABNORMAL LOW (ref >60)
Glucose, Bld: 164 mg/dL — ABNORMAL HIGH (ref 70–99)
Potassium: 4.6 mmol/L (ref 3.5–5.1)
Sodium: 135 mmol/L (ref 135–145)

## 2023-03-27 LAB — CBC
HCT: 23 % — ABNORMAL LOW (ref 39.0–52.0)
Hemoglobin: 7.5 g/dL — ABNORMAL LOW (ref 13.0–17.0)
MCH: 24.2 pg — ABNORMAL LOW (ref 26.0–34.0)
MCHC: 32.6 g/dL (ref 30.0–36.0)
MCV: 74.2 fL — ABNORMAL LOW (ref 80.0–100.0)
Platelets: 334 10*3/uL (ref 150–400)
RBC: 3.1 MIL/uL — ABNORMAL LOW (ref 4.22–5.81)
RDW: 21 % — ABNORMAL HIGH (ref 11.5–15.5)
WBC: 9 10*3/uL (ref 4.0–10.5)
nRBC: 0 % (ref 0.0–0.2)

## 2023-03-27 MED ORDER — POLYETHYLENE GLYCOL 3350 17 G PO PACK
17.0000 g | PACK | Freq: Two times a day (BID) | ORAL | Status: DC
  Administered 2023-03-27 – 2023-03-29 (×4): 17 g via ORAL
  Filled 2023-03-27 (×4): qty 1

## 2023-03-27 MED ORDER — BISACODYL 5 MG PO TBEC
10.0000 mg | DELAYED_RELEASE_TABLET | Freq: Every day | ORAL | Status: DC | PRN
  Administered 2023-03-27: 10 mg via ORAL
  Filled 2023-03-27: qty 2

## 2023-03-27 MED ORDER — BISACODYL 10 MG RE SUPP: 10.0000 mg | Freq: Every day | RECTAL | Status: DC | PRN

## 2023-03-27 NOTE — Progress Notes (Signed)
Assessment/ Plan: 67 year old male with past medical history significant for hypertension, T2DM, HLD presented with several weeks of back pain, decreased urine output, seen as a consultation for the management of AKI and hyperkalemia.  1. AKI: 2/2 obstructive uropathy from metastatic prostate cancer causing urinary outlet obstruction and bilateral hydronephrosis. Foley catheter in place, had 3550/2100mL UOP over past 48 hrs w/ postobstructive diuresis. Continues to have mild but improved suprapubic discomfort. Renal function slowly improving. Foley with red-tinted urine. Off HCO3 gtt. Cont to trend renal function. Avoid nephrotoxic medications, continue to hold home ARB and aldactone.  - No recent labs and unclear how long obstructive uropathy has been present for. Fortunately renal function is slowly improving; I'm hopeful that there is not significant CKD but only time will tell.   Will sign off at this time and have [facility] office call him early next week for a f/u in 2-3 weeks to evaluate for recovery of renal function and possible new baseline Cr. Last creatinine that I see prior to this hospitalization was 06/27/2013 Cr 1.06.   2.Electrolyte derangements: BMP with hyponatremia 136 and hyperkalemia 4.6. Hyperkalemia likely 2/2 AKI, complicated possibly by blood transfusion 06/05.   3.Non-anion gap metabolic acidosis: Bicarbonate 25. Off sodium bicarbonate. 4.Hypertension/volume: Remains mildly hypertensive.Home amlodipine and carvedilol restarted by primary team on admission. No concern for volume overload at this time. Per primary. 5.Anemia: In the setting of active malignancy. Improved with PRBC transfusion on admission. Microcytic, Hgb 8.2 and MCV 73.1. Iron studies with iron 46, TIBC 269, saturation 17%, ferritin 129. Total body iron deficit of 1286 mg. Will give ferrlecit 250 mg IV daily x 3 days. Trend CBC, transfuse for Hgb <7. 6. Metastatic prostate cancer: Urology following for management and  recommendations. Not sure if he and his wife understand the significance of his cancer. Encouraged him to ask any questions that he may have.  Subjective:  Patient states that he feels okay this morning. Walking is uncomfortable due to foley catheter but able to ambulate without assistance or walker. Denies f/c/n/v/sob. Good appetite. Spouse bedside and updated.   Objective Vital signs in last 24 hours: Vitals:   03/26/23 1548 03/26/23 2026 03/27/23 0538 03/27/23 0817  BP: 127/64 135/63 (!) 141/75 (!) 126/106  Pulse: 71 73 73 77  Resp: 16 19 19 17   Temp: 98.2 F (36.8 C) 98.4 F (36.9 C) 98.6 F (37 C) 97.8 F (36.6 C)  TempSrc: Oral Oral Oral   SpO2: 98% 96% 97% 98%  Weight:      Height:       Weight change:   Intake/Output Summary (Last 24 hours) at 03/27/2023 0847 Last data filed at 03/27/2023 0544 Gross per 24 hour  Intake --  Output 3800 ml  Net -3800 ml       Labs: Basic Metabolic Panel: Recent Labs  Lab 03/24/23 0816 03/24/23 1316 03/25/23 0824 03/26/23 0051 03/27/23 0047  NA 133*   < > 136 135 135  K 6.3*   < > 4.6 4.4 4.6  CL 106   < > 101 99 102  CO2 16*   < > 21* 25 23  GLUCOSE 98   < > 86 136* 164*  BUN 24*   < > 26* 33* 36*  CREATININE 2.94*   < > 2.79* 2.91* 2.81*  CALCIUM 9.0   < > 9.2 8.5* 8.4*  PHOS 4.4  --  4.7* 5.1*  --    < > = values in this interval not displayed.  Liver Function Tests: Recent Labs  Lab 03/23/23 2137 03/24/23 0816 03/25/23 0824 03/26/23 0051  AST 18  --   --   --   ALT 10  --   --   --   ALKPHOS 65  --   --   --   BILITOT 0.5  --   --   --   PROT 7.1  --   --   --   ALBUMIN 2.7*  2.7* 2.8* 2.8* 2.6*   No results for input(s): "LIPASE", "AMYLASE" in the last 168 hours. No results for input(s): "AMMONIA" in the last 168 hours.  CBC: Recent Labs  Lab 03/23/23 0504 03/24/23 0816 03/25/23 0824 03/26/23 0051 03/27/23 0047  WBC 9.8 8.7 9.0 8.9 9.0  HGB 5.9* 8.2* 8.6* 7.8* 7.5*  HCT 18.7* 25.0* 25.7* 22.7*  23.0*  MCV 68.2* 73.1* 71.4* 71.6* 74.2*  PLT 411* 298 400 329 334   Cardiac Enzymes: Recent Labs  Lab 03/23/23 2137  CKTOTAL 123   CBG: Recent Labs  Lab 03/25/23 1713 03/25/23 2224 03/26/23 0743 03/26/23 1204 03/26/23 2119  GLUCAP 167* 175* 109* 107* 167*    Iron Studies:  No results for input(s): "IRON", "TIBC", "TRANSFERRIN", "FERRITIN" in the last 72 hours.  Studies/Results: US RENAL  Result Date: 03/25/2023 CLINICAL DATA:  Hydronephrosis EXAM: RENAL / URINARY TRACT ULTRASOUND COMPLETE COMPARISON:  Renal stone protocol CT 03/23/2023 FINDINGS: Right Kidney: Renal measurements: 10.2 x 5.6 x 6.5 cm = volume: 197 mL. Echogenicity within normal limits. Mild to moderate right hydronephrosis is unchanged since prior CT. Left Kidney: Renal measurements: 12.4 x 7.0 x 5.7 cm = volume: 260 mL. Echogenicity within normal limits. Mild left hydronephrosis is unchanged from prior exam. Bladder: Collapsed around Foley catheter balloon. Other: None. IMPRESSION: Unchanged bilateral hydronephrosis. Electronically Signed   By: Acquanetta Belling M.D.   On: 03/25/2023 10:27    Medications: Infusions:   Scheduled Medications:  acetaminophen  1,000 mg Oral Q6H   amLODipine  10 mg Oral Daily   atorvastatin  40 mg Oral QHS   carvedilol  25 mg Oral BID WC   Chlorhexidine Gluconate Cloth  6 each Topical Q0600   feeding supplement (NEPRO CARB STEADY)  237 mL Oral BID BM   polyethylene glycol  17 g Oral Daily   rivaroxaban  10 mg Oral Daily   senna-docusate  1 tablet Oral BID   I have reviewed scheduled and prn medications.  Physical Exam: Constitutional:Resting comfortably, in no acute distress. Cardio:RRR Pulm:CTA bilaterally Abdomen:SNDNT AV:WUJWJ catheter in place with red-tinged urine in bag. No CVA tenderness. XBJ:[id number] for extremity edema. Skin:Warm and dry. Neuro:Alert and oriented x3. No focal deficit noted.

## 2023-03-27 NOTE — Progress Notes (Signed)
IMTS Daily Note  Subjective: He feels well today. Still some bladder cramping, but medicines are helping. Has not had a bowel movement recently. No nausea or vomiting.   Objective:  Vital signs in last 24 hours: Vitals:   03/26/23 1548 03/26/23 2026 03/27/23 0538 03/27/23 0817  BP: 127/64 135/63 (!) 141/75 (!) 126/106  Pulse: 71 73 73 77  Resp: 16 19 19 17   Temp: 98.2 F (36.8 C) 98.4 F (36.9 C) 98.6 F (37 C) 97.8 F (36.6 C)  TempSrc: Oral Oral Oral   SpO2: 98% 96% 97% 98%  Weight:      Height:       GEN: well appearing, sitting in bed, NAD CV: rrr, no m/r/g PULM: lung clear bilaterally, no wheezes or crackles ABD: soft, NT/ND, normal bowel sounds GU: +foley draining pink urine with 1 clot visualized NEURO: 5/5 strength in b/l UE & LE, sensation in tact   Assessment/Plan:  Principal Problem:   AKI (acute kidney injury) (HCC) Active Problems:   Hyperkalemia   Microcytic anemia   Metabolic acidosis, normal anion gap (NAG)   Essential hypertension, benign   Prostate mass   Hydronephrosis  66yo man with HTN & T2DM who presented with back pain, and was unfortunately found to have metastatic prostate cancer causing urinary obstruction with AKI, now slowly improving.   # Suspected new metastatic prostate cancer - NPO @ midnight for IR biopsy of bone lesion on 6/10 - Holding DVT chemo ppx pre biopsy  - Urology is following. He received Firmagon 240mg  injection on 03/24/23 and will need outpatient Urology follow up - Consider Heme/Onc consult prior to discharge to connect him with Onc navigator - Pain control with standing tylenol, prn dilaudid  # Postrenal AKI 2/2 obstructive uropathy # Bilateral hydronephrosis 2/2 obstruction from suspected prostate cancer - His UOP has been excellent and electrolytes are much better, but Cr is still 2.8 today. I don't think he is still obstructed since he has good UOP, but perhaps he has ATN and/or had some chronic kidney disease  develop - Renal is following - Prior to discharge, will ask Urology if repeat renal U/S is needed for consideration of perc neph tubes - BMP tomorrow  # constipation - Continue Senokot BID, increase miralax to BID, add bisacodyl PO & suppository prn   # C5 epidural tumor - Daily neuro exam  #Hypertension -Continue amlodipine 10 mg daily -Continue carvedilol 25 mg twice daily -Hold home spironolactone in the setting of AKI -Hold home losartan 100 mg in the setting of AKI   #Hyperlipidemia -Continue atorvastatin 40 mg daily  Dispo: Anticipated discharge in approximately 2 day(s).   Mercie Eon, MD 03/27/2023, 1:37 PM

## 2023-03-28 ENCOUNTER — Inpatient Hospital Stay (HOSPITAL_COMMUNITY): Payer: 59

## 2023-03-28 DIAGNOSIS — N138 Other obstructive and reflux uropathy: Secondary | ICD-10-CM | POA: Diagnosis not present

## 2023-03-28 DIAGNOSIS — G9589 Other specified diseases of spinal cord: Secondary | ICD-10-CM

## 2023-03-28 DIAGNOSIS — N1330 Unspecified hydronephrosis: Secondary | ICD-10-CM | POA: Diagnosis not present

## 2023-03-28 DIAGNOSIS — N179 Acute kidney failure, unspecified: Secondary | ICD-10-CM | POA: Diagnosis not present

## 2023-03-28 DIAGNOSIS — D638 Anemia in other chronic diseases classified elsewhere: Secondary | ICD-10-CM | POA: Diagnosis not present

## 2023-03-28 LAB — CBC
HCT: 23.3 % — ABNORMAL LOW (ref 39.0–52.0)
HCT: 25.3 % — ABNORMAL LOW (ref 39.0–52.0)
Hemoglobin: 7.5 g/dL — ABNORMAL LOW (ref 13.0–17.0)
Hemoglobin: 8.2 g/dL — ABNORMAL LOW (ref 13.0–17.0)
MCH: 23.7 pg — ABNORMAL LOW (ref 26.0–34.0)
MCH: 23.8 pg — ABNORMAL LOW (ref 26.0–34.0)
MCHC: 32.2 g/dL (ref 30.0–36.0)
MCHC: 32.4 g/dL (ref 30.0–36.0)
MCV: 73.7 fL — ABNORMAL LOW (ref 80.0–100.0)
MCV: 73.5 fL — ABNORMAL LOW (ref 80.0–100.0)
Platelets: 300 10*3/uL (ref 150–400)
Platelets: 322 10*3/uL (ref 150–400)
RBC: 3.16 MIL/uL — ABNORMAL LOW (ref 4.22–5.81)
RBC: 3.44 MIL/uL — ABNORMAL LOW (ref 4.22–5.81)
RDW: 21.4 % — ABNORMAL HIGH (ref 11.5–15.5)
RDW: 21.6 % — ABNORMAL HIGH (ref 11.5–15.5)
WBC: 8.6 10*3/uL (ref 4.0–10.5)
WBC: 8.2 10*3/uL (ref 4.0–10.5)
nRBC: 0 % (ref 0.0–0.2)
nRBC: 0 % (ref 0.0–0.2)

## 2023-03-28 LAB — BASIC METABOLIC PANEL
Anion gap: 11 (ref 5–15)
BUN: 38 mg/dL — ABNORMAL HIGH (ref 8–23)
CO2: 22 mmol/L (ref 22–32)
Calcium: 8.6 mg/dL — ABNORMAL LOW (ref 8.9–10.3)
Chloride: 102 mmol/L (ref 98–111)
Creatinine, Ser: 2.62 mg/dL — ABNORMAL HIGH (ref 0.61–1.24)
GFR, Estimated: 26 mL/min — ABNORMAL LOW (ref >60)
Glucose, Bld: 165 mg/dL — ABNORMAL HIGH (ref 70–99)
Potassium: 4.5 mmol/L (ref 3.5–5.1)
Sodium: 135 mmol/L (ref 135–145)

## 2023-03-28 MED ORDER — FENTANYL CITRATE (PF) 100 MCG/2ML IJ SOLN
INTRAMUSCULAR | Status: AC | PRN
  Administered 2023-03-28: 25 ug via INTRAVENOUS
  Administered 2023-03-28: 50 ug via INTRAVENOUS

## 2023-03-28 MED ORDER — MIDAZOLAM HCL 2 MG/2ML IJ SOLN
INTRAMUSCULAR | Status: AC | PRN
  Administered 2023-03-28: 1 mg via INTRAVENOUS

## 2023-03-28 MED ORDER — LIDOCAINE HCL 1 % IJ SOLN
10.0000 mL | Freq: Once | INTRAMUSCULAR | Status: DC
  Filled 2023-03-28: qty 10

## 2023-03-28 MED ORDER — MIDAZOLAM HCL 2 MG/2ML IJ SOLN
INTRAMUSCULAR | Status: AC
  Filled 2023-03-28: qty 4

## 2023-03-28 MED ORDER — FENTANYL CITRATE (PF) 100 MCG/2ML IJ SOLN
INTRAMUSCULAR | Status: AC
  Filled 2023-03-28: qty 4

## 2023-03-28 NOTE — Progress Notes (Signed)
Patient ID: Carlos Martin, male   DOB: 1955-12-14, 67 y.o.   MRN: [id number]  Notes from weekend reviewed.  CT guided bone biopsy today.  Renal function gradually still improving.  Continue Foley and monitor renal function.  Will consider repeat imaging once renal function has truly stabilized.  I imagine his obstructive uropathy has been chronic and may take time to fully resolve.  Will continue to follow.

## 2023-03-28 NOTE — Procedures (Signed)
Interventional Radiology Procedure Note  Procedure:   CT guided right iliac bone lesion bx.   Mx core acquired.   Complications: None  EBL: None Specimen:  To path  Recommendations: - Bedrest 1 hours.   - Routine wound care - Follow up pathology - Advance diet OK, per primary order - ambulate 1 hr after sedation recovery per primary order  Signed,  Gilmer Mor, DO

## 2023-03-28 NOTE — Progress Notes (Signed)
HD#5 Subjective:   Summary: This is a 67 year old male with a past medical history of hypertension, type 2 diabetes who presents for chronic back pain urinary retention. Imaging revealed suspicious prostate mass as well as bilateral hydronephrosis.  Patient admitted for post renal AKI in the setting of obstruction secondary to suspected prostatic cancer.  Overnight Events: No acute overnight events   Patient has just come back from his  pelvic bone biopsy.  He states he is doing well.  He has no concerns this morning.  He denies any pain.  He states his appetite is well.  He denies any numbness or tingling.  Objective:  Vital signs in last 24 hours: Vitals:   03/28/23 0935 03/28/23 0940 03/28/23 0945 03/28/23 0950  BP: 125/76 133/76 128/71 129/73  Pulse: 70 68 68 68  Resp: 16 16 16 16   Temp:      TempSrc:      SpO2: 100% 99% 100% 97%  Weight:      Height:       Supplemental O2: Room Air SpO2: 97 %  Physical Exam:  Constitutional: Resting in bed, no acute distress HENT: normocephalic atraumatic Cardiovascular: regular rate and rhythm, no m/r/g Pulmonary/Chest: normal work of breathing on room air, lungs clear to auscultation bilaterally Abdominal: soft, non-tender, non-distended, bruising noted to abdomen where heparin is injected GU: Foley draining well, with yellow-colored urine  Filed Weights   03/23/23 0456  Weight: 86.2 kg     Intake/Output Summary (Last 24 hours) at 03/28/2023 1455 Last data filed at 03/28/2023 1219 Gross per 24 hour  Intake 720 ml  Output 2350 ml  Net -1630 ml   Net IO Since Admission: -11,187 mL [03/28/23 1455]  Pertinent Labs:    Latest Ref Rng & Units 03/28/2023   12:58 AM 03/27/2023   12:47 AM 03/26/2023   12:51 AM  CBC  WBC 4.0 - 10.5 K/uL 8.6  9.0  8.9   Hemoglobin 13.0 - 17.0 g/dL 7.5  7.5  7.8   Hematocrit 39.0 - 52.0 % 23.3  23.0  22.7   Platelets 150 - 400 K/uL 300  334  329        Latest Ref Rng & Units 03/28/2023   12:58  AM 03/27/2023   12:47 AM 03/26/2023   12:51 AM  CMP  Glucose 70 - 99 mg/dL 161  096  045   BUN 8 - 23 mg/dL 38  36  33   Creatinine 0.61 - 1.24 mg/dL 4.09  8.11  9.14   Sodium 135 - 145 mmol/L 135  135  135   Potassium 3.5 - 5.1 mmol/L 4.5  4.6  4.4   Chloride 98 - 111 mmol/L 102  102  99   CO2 22 - 32 mmol/L 22  23  25    Calcium 8.9 - 10.3 mg/dL 8.6  8.4  8.5     Imaging: CT BIOPSY  Result Date: 03/28/2023 INDICATION: 67 year old male presents for biopsy of right pelvic bone mass, presumably metastatic prostate adenocarcinoma. EXAM: CT BIOPSY MEDICATIONS: None. ANESTHESIA/SEDATION: Moderate (conscious) sedation was employed during this procedure. A total of Versed 1.0 mg and Fentanyl 75 mcg was administered intravenously. Moderate Sedation Time: 21 minutes. The patient's level of consciousness and vital signs were monitored continuously by radiology nursing throughout the procedure under my direct supervision. FLUOROSCOPY TIME:  CT COMPLICATIONS: None PROCEDURE: Informed written consent was obtained from the patient after a thorough discussion of the procedural risks, benefits and alternatives.  All questions were addressed. Maximal Sterile Barrier Technique was utilized including caps, mask, sterile gowns, sterile gloves, sterile drape, hand hygiene and skin antiseptic. A timeout was performed prior to the initiation of the procedure. Patient was position prone. Scout CT of the pelvis was performed for surgical planning purposes. The posterior pelvis was prepped with Chlorhexidine in a sterile fashion, and a sterile drape was applied covering the operative field. A sterile gown and sterile gloves were used for the procedure. Local anesthesia was provided with 1% Lidocaine. Lytic lesion within the right iliac bone was targeted for biopsy. The skin and subcutaneous tissues were infiltrated with 1% lidocaine without epinephrine. A small stab incision was made with an 11 blade scalpel, and an 11 gauge  Murphy needle was advanced with CT guidance to the posterior cortex. Once the needle was in position within the cortex, a CT was acquired confirming a trajectory. We then performed multiple coaxial core biopsy, with the first needle position confirmed with CT imaging directly through the lesion. Multiple core biopsy were placed into specimen cup. Finally the 11 gauge Murphy needle was advanced through the lesion and the core biopsy was retrieved. Manual pressure was used for hemostasis and a sterile dressing was placed. No complications were encountered no significant blood loss was encountered. Patient tolerated the procedure well and remained hemodynamically stable throughout. IMPRESSION: Status post CT-guided biopsy of right iliac bone lytic lesion. Signed, Yvone Neu. Miachel Roux, RPVI [facility] Radiology Electronically Signed   By: Gilmer Mor D.O.   On: 03/28/2023 12:50    Assessment/Plan:   Principal Problem:   AKI (acute kidney injury) (HCC) Active Problems:   Hyperkalemia   Microcytic anemia   Metabolic acidosis, normal anion gap (NAG)   Essential hypertension, benign   Prostate mass   Hydronephrosis   Patient Summary: This is a 67 year old male with a past medical history of hypertension, type 2 diabetes who presents for chronic back pain urinary retention. Imaging revealed suspicious prostate mass as well as bilateral hydronephrosis.  Patient admitted for post renal AKI in the setting of obstruction secondary to suspected prostate cancer.  #Postrenal AKI 2/2 obstructive uropathy  #Bilateral hydronephrosis secondary to obstruction from suspected metastatic prostate cancer Patient evaluated bedside this morning.  Patient had just got back from biopsy from IR.  Patient stated that he tolerated the procedure well.  He denies any pain at this time.  On exam, patient does not have any active bleeding appreciated from the site.  AKI is  improving.  Foley with good output.  Creatinine down to 2.62.  I anticipate this will take some time.  I think the picture got complicated with ATN as well.  Will continue to follow renal function.  Patient is approaching discharge.  Anticipate discharge will be either tomorrow or the day after.   -Follow-up renal ultrasound -Urology following -Nephrology signed off -Follow-up biopsy results -Continue pain control with Dilaudid and Tylenol  -Continue to monitor BMP  -Patient connected with oncology navigator  -Patient received Firmagon 240 mg injection 03/24/2023  #Hyperkalemia, resolved Potassium within normal limits. -Will continue to follow BMP.  #Anemia of chronic disease Hemoglobin slightly decreased today.  Still stable.  No concerns for bleeding at this time. -Follow-up CBC in the afternoon -Continue monitor CBC   #C5 epidural tumor Stable at this time.  No neurological deficits at this time.  Patient denies any numbness or tingling. -Continue to monitor for worsening neurological signs -Continue with  pain control with Dilaudid and Tylenol   #Severe spinal stenosis T9-T11 Stable at this time.  No acute concerns.  Continue to monitor for worsening neurological symptoms.  Patient denies any numbness or tingling. -Continue to monitor for worsening neurological symptoms.  #Hypertension Blood pressure measuring well. -Continue amlodipine 10 mg daily -Continue carvedilol 25 mg twice daily -Hold home spironolactone in the setting of AKI -Hold home losartan 100 mg in the setting of AKI  #Type 2 diabetes Fasting glucose this morning was 165.  Holding home metformin. -Continue to monitor BMP -Continue to hold home metformin  #Hyperlipidemia -Continue atorvastatin 40 mg daily  Diet: Normal IVF: N/A VTE: DOAC Code: Full  Dispo: Anticipated discharge to Home in 1 days pending clinical improvement.   Modena Slater DO Internal Medicine Resident PGY-1 [phone] Please contact  the on call pager after 5 pm and on weekends at [phone].

## 2023-03-29 ENCOUNTER — Telehealth: Payer: Self-pay | Admitting: Hematology

## 2023-03-29 ENCOUNTER — Other Ambulatory Visit (HOSPITAL_COMMUNITY): Payer: Self-pay

## 2023-03-29 DIAGNOSIS — N320 Bladder-neck obstruction: Secondary | ICD-10-CM | POA: Diagnosis not present

## 2023-03-29 DIAGNOSIS — N179 Acute kidney failure, unspecified: Secondary | ICD-10-CM | POA: Diagnosis not present

## 2023-03-29 DIAGNOSIS — C61 Malignant neoplasm of prostate: Secondary | ICD-10-CM | POA: Diagnosis not present

## 2023-03-29 DIAGNOSIS — N1339 Other hydronephrosis: Secondary | ICD-10-CM | POA: Diagnosis not present

## 2023-03-29 LAB — CBC
HCT: 22.6 % — ABNORMAL LOW (ref 39.0–52.0)
Hemoglobin: 7.2 g/dL — ABNORMAL LOW (ref 13.0–17.0)
MCH: 23.8 pg — ABNORMAL LOW (ref 26.0–34.0)
MCHC: 31.9 g/dL (ref 30.0–36.0)
MCV: 74.6 fL — ABNORMAL LOW (ref 80.0–100.0)
Platelets: 315 10*3/uL (ref 150–400)
RBC: 3.03 MIL/uL — ABNORMAL LOW (ref 4.22–5.81)
RDW: 21.8 % — ABNORMAL HIGH (ref 11.5–15.5)
WBC: 8.1 10*3/uL (ref 4.0–10.5)
nRBC: 0 % (ref 0.0–0.2)

## 2023-03-29 LAB — BASIC METABOLIC PANEL
Anion gap: 16 — ABNORMAL HIGH (ref 5–15)
BUN: 46 mg/dL — ABNORMAL HIGH (ref 8–23)
CO2: 19 mmol/L — ABNORMAL LOW (ref 22–32)
Calcium: 8.9 mg/dL (ref 8.9–10.3)
Chloride: 101 mmol/L (ref 98–111)
Creatinine, Ser: 2.44 mg/dL — ABNORMAL HIGH (ref 0.61–1.24)
GFR, Estimated: 29 mL/min — ABNORMAL LOW (ref >60)
Glucose, Bld: 217 mg/dL — ABNORMAL HIGH (ref 70–99)
Potassium: 4.6 mmol/L (ref 3.5–5.1)
Sodium: 136 mmol/L (ref 135–145)

## 2023-03-29 LAB — GLUCOSE, CAPILLARY
Glucose-Capillary: 117 mg/dL — ABNORMAL HIGH (ref 70–99)
Glucose-Capillary: 231 mg/dL — ABNORMAL HIGH (ref 70–99)

## 2023-03-29 MED ORDER — SENNOSIDES-DOCUSATE SODIUM 8.6-50 MG PO TABS
1.0000 | ORAL_TABLET | Freq: Two times a day (BID) | ORAL | 0 refills | Status: DC
  Filled 2023-03-29: qty 60, 30d supply, fill #0

## 2023-03-29 MED ORDER — HYDROMORPHONE HCL 2 MG PO TABS
2.0000 mg | ORAL_TABLET | Freq: Two times a day (BID) | ORAL | 0 refills | Status: AC | PRN
  Filled 2023-03-29: qty 14, 7d supply, fill #0

## 2023-03-29 MED ORDER — INSULIN ASPART 100 UNIT/ML IJ SOLN
0.0000 [IU] | Freq: Three times a day (TID) | INTRAMUSCULAR | Status: DC
  Administered 2023-03-29: 3 [IU] via SUBCUTANEOUS

## 2023-03-29 MED ORDER — BISACODYL 5 MG PO TBEC
10.0000 mg | DELAYED_RELEASE_TABLET | Freq: Every day | ORAL | 0 refills | Status: DC | PRN
  Filled 2023-03-29: qty 30, 15d supply, fill #0

## 2023-03-29 NOTE — Progress Notes (Signed)
Patient ID: Carlos Martin, male   DOB: 1956-03-04, 67 y.o.   MRN: [id number]    Subjective: Pt s/p bone biopsy yesterday.  Mild pain at site but well controlled.  Objective: Vital signs in last 24 hours: Temp:  [98 F (36.7 C)-98.9 F (37.2 C)] 98 F (36.7 C) (06/11 0456) Pulse Rate:  [66-74] 66 (06/11 0456) Resp:  [16-19] 19 (06/11 0456) BP: (119-138)/(62-78) 119/69 (06/11 0456) SpO2:  [96 %-100 %] 100 % (06/11 0456)  Intake/Output from previous day: 06/10 0701 - 06/11 0700 In: 480 [P.O.:480] Out: 2875 [Urine:2875] Intake/Output this shift: No intake/output data recorded.  Physical Exam:  General: Alert and oriented CV: RRR Lungs: Clear Abdomen: Soft, ND Incisions: Ext: NT, No erythema  Lab Results: Recent Labs    03/28/23 0058 03/28/23 1503 03/29/23 0049  HGB 7.5* 8.2* 7.2*  HCT 23.3* 25.3* 22.6*   BMET Recent Labs    03/28/23 0058 03/29/23 0049  NA 135 136  K 4.5 4.6  CL 102 101  CO2 22 19*  GLUCOSE 165* 217*  BUN 38* 46*  CREATININE 2.62* 2.44*  CALCIUM 8.6* 8.9     Studies/Results: US RENAL  Result Date: 03/28/2023 CLINICAL DATA:  Acute kidney injury.  Hydronephrosis. EXAM: RENAL / URINARY TRACT ULTRASOUND COMPLETE COMPARISON:  Renal ultrasound 03/25/2023 FINDINGS: Right Kidney: Renal measurements: 9.7 x 5.1 x 6.6 cm = volume: 227 mL. Unchanged mild to moderate hydronephrosis. No focal renal abnormality or stone. Normal parenchymal echogenicity. Left Kidney: Renal measurements: 12.5 x 6 x 6 cm = volume: 237 mL. Unchanged mild hydronephrosis. No focal renal abnormality or stone. Normal parenchymal echogenicity. Bladder: Decompressed by Foley catheter. Suspect circumferential bladder wall thickening. Other: None. IMPRESSION: 1. Unchanged bilateral hydronephrosis, right greater than left. 2. Urinary bladder decompressed by Foley catheter. Suspected bladder wall thickening. Electronically Signed   By: Narda Rutherford M.D.   On: 03/28/2023 16:39   CT  BIOPSY  Result Date: 03/28/2023 INDICATION: 67 year old male presents for biopsy of right pelvic bone mass, presumably metastatic prostate adenocarcinoma. EXAM: CT BIOPSY MEDICATIONS: None. ANESTHESIA/SEDATION: Moderate (conscious) sedation was employed during this procedure. A total of Versed 1.0 mg and Fentanyl 75 mcg was administered intravenously. Moderate Sedation Time: 21 minutes. The patient's level of consciousness and vital signs were monitored continuously by radiology nursing throughout the procedure under my direct supervision. FLUOROSCOPY TIME:  CT COMPLICATIONS: None PROCEDURE: Informed written consent was obtained from the patient after a thorough discussion of the procedural risks, benefits and alternatives. All questions were addressed. Maximal Sterile Barrier Technique was utilized including caps, mask, sterile gowns, sterile gloves, sterile drape, hand hygiene and skin antiseptic. A timeout was performed prior to the initiation of the procedure. Patient was position prone. Scout CT of the pelvis was performed for surgical planning purposes. The posterior pelvis was prepped with Chlorhexidine in a sterile fashion, and a sterile drape was applied covering the operative field. A sterile gown and sterile gloves were used for the procedure. Local anesthesia was provided with 1% Lidocaine. Lytic lesion within the right iliac bone was targeted for biopsy. The skin and subcutaneous tissues were infiltrated with 1% lidocaine without epinephrine. A small stab incision was made with an 11 blade scalpel, and an 11 gauge Murphy needle was advanced with CT guidance to the posterior cortex. Once the needle was in position within the cortex, a CT was acquired confirming a trajectory. We then performed multiple coaxial core biopsy, with the first needle position confirmed with CT imaging directly through  the lesion. Multiple core biopsy were placed into specimen cup. Finally the 11 gauge Murphy needle was  advanced through the lesion and the core biopsy was retrieved. Manual pressure was used for hemostasis and a sterile dressing was placed. No complications were encountered no significant blood loss was encountered. Patient tolerated the procedure well and remained hemodynamically stable throughout. IMPRESSION: Status post CT-guided biopsy of right iliac bone lytic lesion. Signed, Yvone Neu. Miachel Roux, RPVI [facility] Radiology Electronically Signed   By: Gilmer Mor D.O.   On: 03/28/2023 12:50    Assessment/Plan: 1) AKI/bilateral hydronephrosis/bladder outlet obstruction: Continue urethral catheter upon discharge.  Renal function gradually improving still.  I will arrange outpatient follow up to recheck renal function and renal ultrasound in next 2 weeks.  Ok for discharge from a urologic standpoint.  2) Probable metastatic prostate cancer:  Degarelix 240 mg administered 6/6.  Will need oncology follow up in next 1-2 weeks to review biopsy results and to continue systemic therapy and to direct possible local therapy such as radiotherapy to epidural lesion if indicated.   LOS: 6 days   [name] 03/29/2023, 7:45 AM

## 2023-03-29 NOTE — Telephone Encounter (Signed)
Scheduled per 6/10 sch msg, left message with pt with appt details

## 2023-03-29 NOTE — Discharge Instructions (Addendum)
Mr. Renn Dirocco,  It was a pleasure taking care of you at [facility]. You were admitted for kidney injury, and suspect prostate cancer.  You are treated with a catheter.  We are discharging you home now that you are doing better. Please follow the following instructions.   1) regarding your Foley catheter, please keep this catheter in until you see your urology doctor.  Your urology doctor office should call you for a follow-up appointment in 1 to 2 weeks.  2) Regarding your kidney injury, we thought this was because of prostate cancer.  We have biopsied this, and you are to follow-up with your oncologist.  You have an appointment with Dr. Mosetta Putt on 04/07/2023.  This appointment is at 3 PM.  Please show up.  3) Regarding your hospital follow-up, we have scheduled you an appointment with the clinic downstairs in the basement of this hospital.  This appointment is on 04/04/2023.  This appointment is at 9:15 AM.  Please show up.  Your appointment is with Dr. Cliffton Asters.  4) Please stop taking your spironolactone, metformin, and losartan.  5) If you stop having urine in the catheter bag, please call your urology office.  You can also call our office at [phone].  Take care,  Dr. Modena Slater, DO

## 2023-03-29 NOTE — Plan of Care (Signed)

## 2023-03-29 NOTE — Progress Notes (Signed)
Discharge instructions (including medications) discussed with and copy provided to patient/caregiver  Patient was taught how to change his urinal bag from leg bag to regular bag.  Put patient leg bag on him to go home and gave him his nighttime large bag to take home.

## 2023-03-29 NOTE — Discharge Summary (Addendum)
Name: Carlos Martin MRN: [id number] DOB: 1956/01/23 67 y.o. PCP: Default, Provider, MD  Date of Admission: 03/23/2023  4:51 AM Date of Discharge: 03/29/2023 Attending Physician: Dr. Heide Spark  Discharge Diagnosis: Principal Problem:   AKI (acute kidney injury) [facility]) Active Problems:   Hyperkalemia   Microcytic anemia   Metabolic acidosis, normal anion gap (NAG)   Essential hypertension, benign   Prostate mass   Hydronephrosis    Discharge Medications: Allergies as of 03/29/2023   No Known Allergies      Medication List     STOP taking these medications    latanoprost 0.005 % ophthalmic solution Commonly known as: XALATAN   losartan 100 MG tablet Commonly known as: COZAAR   metFORMIN 1000 MG tablet Commonly known as: GLUCOPHAGE   spironolactone 50 MG tablet Commonly known as: ALDACTONE       TAKE these medications    acetaminophen 500 MG tablet Commonly known as: TYLENOL Take 1,000 mg by mouth 2 (two) times daily as needed for moderate pain, headache or fever.   amLODipine 10 MG tablet Commonly known as: NORVASC Take 10 mg by mouth daily.   atorvastatin 40 MG tablet Commonly known as: LIPITOR Take 40 mg by mouth daily.   bisacodyl 5 MG EC tablet Generic drug: bisacodyl Take 2 tablets (10 mg total) by mouth daily as needed for moderate constipation.   carvedilol 25 MG tablet Commonly known as: COREG Take 25 mg by mouth daily.   HYDROmorphone 2 MG tablet Commonly known as: Dilaudid Take 1 tablet (2 mg total) by mouth every 12 (twelve) hours as needed for severe pain.   Senexon-S 8.6-50 MG tablet Generic drug: senna-docusate Take 1 tablet by mouth 2 (two) times daily.        Disposition and follow-up:   [name] was discharged from [facility] in Stable condition.  At the hospital follow up visit please address:  1.  Follow-up:  a.  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  Ensure good Foley output.  Repeat BMP on follow-up.  Patient to follow-up with nephrology, oncology, and urology.  The offices should call out to patient, but ensure this has been done.  Ensure patient has follow-up renal ultrasound from urology.  Creatinine on discharge 2.44.    b.  Suspected prostate cancer: Follow-up on biopsy results.   c.  Anemia of chronic disease: Follow-up with repeat hemoglobin at hospital follow-up.  Ensure Foley bag is not blood tinged.   d.  C5 epidural tumor/severe spinal stenosis T9-T11: Follow-up with neuroexam, ensure patient does not have any numbness, weakness, or strength deficits.   e.  Hypertension: Patient discontinued on losartan and spironolactone in setting of AKI.  Patient was continued on amlodipine as well as carvedilol.  Titrate blood pressure medication as tolerated.   f.  Type 2 diabetes mellitus: Given AKI, held metformin.  Resume on hospital follow-up if kidney function back to normal.  2.  Labs / imaging needed at time of follow-up: BMP, CBC  3.  Pending labs/ test needing follow-up: Bone biopsy  4.  Medication Changes  Discontinued spironolactone, losartan, metformin.  Discharged patient home on Dilaudid as well as Tylenol.  Follow-up Appointments:  Follow-up Information     Carlos Bene, MD .   Contact information: [address] [place] [address] [phone]         Carlos Mood, MD. Go on 04/07/2023.   Specialties:  Hematology, Oncology Why: Please show up to your oncology appointment with Dr. Mosetta Putt at 04/07/2023 at 3 PM. Contact information: [address] [place] [address] [phone]                 Hospital Course by problem list:  This is a 66 year old male with a past medical history of hypertension, type 2 diabetes who presented to the emergency department with concerns of chronic back pain and urinary retention.  Imaging revealed suspicious  prostate mass as well as bilateral hydronephrosis.  Patient admitted for further evaluation and management of AKI in the setting of obstruction secondary to suspected prostate cancer.  #Postrenal AKI secondary to obstructive uropathy #Bilateral hydronephrosis secondary to obstruction from suspected metastatic prostate cancer Patient evaluated in the emergency department initially for back pain as well as urinary retention.  Imaging revealed bilateral hydronephrosis as well as suspicious prostate mass.  Patient had Foley placed and admitted for further evaluation and management.  During hospital course, patient had pain control with Dilaudid and Tylenol.  Nephrology followed, who initially started patient on bicarb infusion for hyperkalemia as well as nonanion gap metabolic acidosis.  Patient also had urology following for obstruction.  During hospitalization, Foley drained well, and kidneys had good urine production.  Creatinine slowly came down to 2.44.  Patient also had bone scan showing bony lesions throughout concerning for metastatic prostate cancer.  IR was consulted during hospitalization which patient had bone biopsy of pelvis to evaluate for metastatic disease.  Biopsy results still pending.  Patient on multiple renal ultrasounds during hospitalization showing bilateral hydronephrosis.  Urology thought this would likely take some time to resolve.  There was some thought about potentially using percutaneous nephrostomy tubes, but no indication at this time given patient had good urine output and the obstruction was thought to be at the level of the bladder outlet.  Patient discharged with Foley catheter in place with good output.  Patient received Firmagon 240 mg injection on 03/24/2023.  Patient to continue follow-up with urology outpatient.  Patient to follow-up with nephrology outpatient.  Patient follow-up with oncology outpatient.  Patient discharged on Foley.  Patient also discharged with pain  control with Dilaudid and Tylenol.  Discontinued spironolactone, metformin, losartan.  #Hyperkalemia #NAGMA Patient initially presented with potassium of 6.6.  Patient had temporizing measures with calcium, albuterol, as well as insulin with dextrose.  Patient also had sodium bicarb drip placed.  Potassium trended down well during hospitalization.  #Anemia of chronic disease Patient had hemoglobin of 5.9 on admission.  Patient status post 2 units of packed red blood cells during hospitalization.  Hemoglobin remained stable during hospitalization.  No further concern for bleeding on discharge.  #C5 epidural tumor There is some concern for C5 epidural tumor extending out with concern for nerve compression on imaging.  Patient remained stable from this standpoint.  Continue to monitor outpatient.  Pain control with Dilaudid and Tylenol.  #Severe spinal stenosis T9-T11 Patient with multiple lesions suspicion for mets from prostate cancer seen throughout entire spine.  This was causing severe spinal stenosis of T9-T11.  Patient remained stable with no concerns for numbness or tingling during hospitalization.  Continue to follow outpatient.  Pain control with Dilaudid and Tylenol  #Hypertension During position patient continued home medications of amlodipine 10 mg daily as well as carvedilol 25 mg twice daily.  Given AKI, patient's losartan and spironolactone was held.  Discontinued losartan and spironolactone on discharge.  #Type 2 diabetes mellitus Blood sugars  remained stable during hospitalization.  Held metformin during hospitalization given AKI.  Discontinued metformin on discharge.  #Hyperlipidemia Patient continued on atorvastatin 40 mg daily during hospitalization.   Discharge Subjective:  Patient evaluated at bedside today.  He states he is doing well.  He denies any pain.  He denies any constipation.  He reports he had a normal bowel movement yesterday.  He has no concerns today.  He  states the urologist said that he can go home.  He states he is ready to go home.  Discharge Exam:   BP 120/67 (BP Location: Left Arm)   Pulse 63   Temp 98.1 F (36.7 C) (Oral)   Resp 16   Ht 5\' 9"  (1.753 m)   Wt 86.2 kg   SpO2 100%   BMI 28.06 kg/m  Constitutional: Resting in bed, no acute distress HENT: normocephalic atraumatic Cardiovascular: regular rate and rhythm, no m/r/g Pulmonary/Chest: normal work of breathing on room air, lungs clear to auscultation bilaterally Abdominal: soft, non-tender, non-distended, bruising noted to abdomen where heparin is injected GU: Foley draining well, with yellow-colored urine  Pertinent Labs, Studies, and Procedures:     Latest Ref Rng & Units 03/29/2023   12:49 AM 03/28/2023    3:03 PM 03/28/2023   12:58 AM  CBC  WBC 4.0 - 10.5 K/uL 8.1  8.2  8.6   Hemoglobin 13.0 - 17.0 g/dL 7.2  8.2  7.5   Hematocrit 39.0 - 52.0 % 22.6  25.3  23.3   Platelets 150 - 400 K/uL 315  322  300        Latest Ref Rng & Units 03/29/2023   12:49 AM 03/28/2023   12:58 AM 03/27/2023   12:47 AM  CMP  Glucose 70 - 99 mg/dL 960  454  098   BUN 8 - 23 mg/dL 46  38  36   Creatinine 0.61 - 1.24 mg/dL 1.19  1.47  8.29   Sodium 135 - 145 mmol/L 136  135  135   Potassium 3.5 - 5.1 mmol/L 4.6  4.5  4.6   Chloride 98 - 111 mmol/L 101  102  102   CO2 22 - 32 mmol/L 19  22  23    Calcium 8.9 - 10.3 mg/dL 8.9  8.6  8.4     NM Bone Scan Whole Body  Result Date: 03/24/2023 CLINICAL DATA:  Prostate cancer. EXAM: NUCLEAR MEDICINE WHOLE BODY BONE SCAN TECHNIQUE: Whole body anterior and posterior images were obtained approximately 3 hours after intravenous injection of radiopharmaceutical. RADIOPHARMACEUTICALS:  21.8 mCi Technetium-49m MDP IV COMPARISON:  MRI spine 03/23/2023.  Abdomen pelvis CT 03/23/2019 FINDINGS: Physiologic distribution of tracer along the kidneys and bladder. There are areas of degenerative type uptake identified along the shoulders, spine and sacroiliac  regions. However there is multifocal areas of osseous metastatic disease. This includes along both proximal femurs, scattered along the pelvis more right-sided than left, multiple bilateral ribs, scapula sternum and thoracolumbar spine. Some areas as well along the lower cervical spine. IMPRESSION: Multifocal osseous metastatic disease. Areas include numerous bilateral ribs, sternum, scapula, spine, pelvis and proximal femurs Electronically Signed   By: Karen Kays M.D.   On: 03/24/2023 15:36   MR CERVICAL SPINE W WO CONTRAST  Result Date: 03/24/2023 CLINICAL DATA:  Paraspinal mass/tumor, cervical spine Epidural Expansion of tumor at c5. EXAM: MRI CERVICAL SPINE WITHOUT AND WITH CONTRAST TECHNIQUE: Multiplanar and multiecho pulse sequences of the cervical spine, to include the craniocervical junction and cervicothoracic junction,  were obtained without and with intravenous contrast. CONTRAST:  8mL GADAVIST GADOBUTROL 1 MMOL/ML IV SOLN COMPARISON:  MRI thoracic spine 03/23/2023. FINDINGS: Alignment: Normal. Vertebrae: Diffuse marrow replacing, enhancing disease throughout the cervical spine, greatest from C4-C6. Ventral epidural extension of tumor from C4-C6, extending into the right neural foramen at C4-5 (axial image 22 series 10) and left neural foramen C5-6 (axial image 27 series 10). Bowing of the posterior cortex at C5 on left results deformity of the left ventral cord with associated T2 hyperintensity, favored to reflect edema (axial image 26 series 6). Cord: As above. Posterior Fossa, vertebral arteries, paraspinal tissues: Diffuse marrow replacing, enhancing disease in the clivus and occipital condyles without evidence of epidural extension. Disc levels: C2-C3: No disc herniation or spinal canal stenosis. Facet arthropathy and uncovertebral joint spurring results in moderate bilateral neural foraminal narrowing. C3-C4: Small disc bulge without spinal canal stenosis. Right-greater-than-left facet  arthropathy and uncovertebral joint spurring results in severe right neural foraminal narrowing. C4-C5: Ventral epidural and perineural extension of tumor along the right C5 nerve and C4-5 neural foramen results in mild deformity of the right ventral cord and severe right neural foraminal narrowing. Posterolateral bowing of the C5 vertebral body cortex results in moderate left neural foraminal narrowing. C5-C6: Posterolateral bowing of the C5 posterior cortex results in moderate left neural foraminal narrowing and deformity of the left ventral cord with associated T2 hyperintensity, as above. C6-C7: Disc bulge results in mild spinal canal stenosis. Facet arthropathy and uncovertebral joint spurring contribute to moderate bilateral neural foraminal narrowing. C7-T1: No disc herniation, spinal canal stenosis or neural foraminal narrowing. Mild left facet arthropathy. IMPRESSION: 1. Diffuse marrow replacing, enhancing disease throughout the cervical spine, greatest from C4-C6. 2. Ventral epidural and perineural extension of tumor from C4-C6, extending into the right neural foramen at C4-5 and left neural foramen at C5-6. 3. Ventral epidural and perineural extension of tumor along the right C5 nerve and C4-5 neural foramen results in mild deformity of the right ventral cord and severe right neural foraminal narrowing. 4. Posterolateral bowing of the C5 vertebral body cortex results in moderate left neural foraminal narrowing at C4-5 and C5-6, as well as deformity of the left ventral cord with edema at the C5-6 level. 5. Diffuse marrow replacing, enhancing disease in the clivus and occipital condyles without evidence of epidural extension. 6. Multilevel cervical spondylosis, as described above. Electronically Signed   By: Orvan Falconer M.D.   On: 03/24/2023 09:38   MR THORACIC SPINE W WO CONTRAST  Result Date: 03/23/2023 CLINICAL DATA:  Metastatic disease evaluation EXAM: MRI THORACIC AND LUMBAR SPINE WITHOUT AND  WITH CONTRAST TECHNIQUE: Multiplanar and multiecho pulse sequences of the thoracic and lumbar spine were obtained without and with intravenous contrast. CONTRAST:  8mL GADAVIST GADOBUTROL 1 MMOL/ML IV SOLN COMPARISON:  Same day CT renal stone protocol FINDINGS: MRI THORACIC SPINE FINDINGS Alignment:  Physiologic. Vertebrae: There is diffuse osseous metastatic disease with metastatic lesions at nearly every thoracic vertebral body. There no evidence of a severe pathologic compression deformity. There is evidence of epidural extension of tumor at the T6 level (series 16, image 9), the T8 level (series 17, image 32), T9-T11 levels (series 16, image 11), T12 level (series 17, image 50). Metastatic lesions are also seen throughout the cervical spine with possible epidural extension of tumor at the C5 level. Note these levels are only seen on the counting sequences and are incompletely assessed. Cord: Normal signal and morphology. Paraspinal and other soft tissues: Negative.  Disc levels: There is severe spinal canal stenosis at the T9-T11 levels due to the presence of epidural tumor. MRI LUMBAR SPINE FINDINGS Segmentation:  Standard. Alignment:  Grade 1 anterolisthesis of L5 on S1. Vertebrae: Metastatic lesions are seen at every lumbar and visualized sacral vertebral body Conus medullaris: Extends to the L2-L3 disc space level. There is a fatty filum terminale Paraspinal and other soft tissues: See same day CT Renal Stone protocol exam for additional findings. There is an enlarged right common iliac chain lymph node, worrisome for metastatic disease. Contrast-enhancing lesions are seen in the bilateral iliac bones (series 19, image 39) Disc levels: No evidence of high-grade spinal canal stenosis no evidence of epidural spread of tumor in the lumbar spine IMPRESSION: 1. Diffuse osseous metastatic disease with metastatic lesions at nearly every cervical, thoracic, and lumbar vertebral body. 2. Epidural extension of tumor at  the T6, T8, T9-T11, and T12 levels resulting in severe spinal canal stenosis at the T9-T11 levels. There may also be epidural extension of tumor at the C5 level, but this level is incompletely imaged. Consider further evaluation with a dedicated cervical spine MRI with and without contrast. 3. No evidence of epidural spread of tumor in the lumbar spine. 4. Enlarged right common iliac chain lymph node, worrisome for metastatic disease. 5. Contrast-enhancing lesions in the bilateral iliac bones, worrisome for additional sites of osseous metastatic disease. Electronically Signed   By: Lorenza Cambridge M.D.   On: 03/23/2023 14:01   MR Lumbar Spine W Wo Contrast  Result Date: 03/23/2023 CLINICAL DATA:  Metastatic disease evaluation EXAM: MRI THORACIC AND LUMBAR SPINE WITHOUT AND WITH CONTRAST TECHNIQUE: Multiplanar and multiecho pulse sequences of the thoracic and lumbar spine were obtained without and with intravenous contrast. CONTRAST:  8mL GADAVIST GADOBUTROL 1 MMOL/ML IV SOLN COMPARISON:  Same day CT renal stone protocol FINDINGS: MRI THORACIC SPINE FINDINGS Alignment:  Physiologic. Vertebrae: There is diffuse osseous metastatic disease with metastatic lesions at nearly every thoracic vertebral body. There no evidence of a severe pathologic compression deformity. There is evidence of epidural extension of tumor at the T6 level (series 16, image 9), the T8 level (series 17, image 32), T9-T11 levels (series 16, image 11), T12 level (series 17, image 50). Metastatic lesions are also seen throughout the cervical spine with possible epidural extension of tumor at the C5 level. Note these levels are only seen on the counting sequences and are incompletely assessed. Cord: Normal signal and morphology. Paraspinal and other soft tissues: Negative. Disc levels: There is severe spinal canal stenosis at the T9-T11 levels due to the presence of epidural tumor. MRI LUMBAR SPINE FINDINGS Segmentation:  Standard. Alignment:  Grade  1 anterolisthesis of L5 on S1. Vertebrae: Metastatic lesions are seen at every lumbar and visualized sacral vertebral body Conus medullaris: Extends to the L2-L3 disc space level. There is a fatty filum terminale Paraspinal and other soft tissues: See same day CT Renal Stone protocol exam for additional findings. There is an enlarged right common iliac chain lymph node, worrisome for metastatic disease. Contrast-enhancing lesions are seen in the bilateral iliac bones (series 19, image 39) Disc levels: No evidence of high-grade spinal canal stenosis no evidence of epidural spread of tumor in the lumbar spine IMPRESSION: 1. Diffuse osseous metastatic disease with metastatic lesions at nearly every cervical, thoracic, and lumbar vertebral body. 2. Epidural extension of tumor at the T6, T8, T9-T11, and T12 levels resulting in severe spinal canal stenosis at the T9-T11 levels. There may also  be epidural extension of tumor at the C5 level, but this level is incompletely imaged. Consider further evaluation with a dedicated cervical spine MRI with and without contrast. 3. No evidence of epidural spread of tumor in the lumbar spine. 4. Enlarged right common iliac chain lymph node, worrisome for metastatic disease. 5. Contrast-enhancing lesions in the bilateral iliac bones, worrisome for additional sites of osseous metastatic disease. Electronically Signed   By: Lorenza Cambridge M.D.   On: 03/23/2023 14:01   CT Renal Stone Study  Result Date: 03/23/2023 CLINICAL DATA:  Abdominal/flank pain with stone suspected EXAM: CT ABDOMEN AND PELVIS WITHOUT CONTRAST TECHNIQUE: Multidetector CT imaging of the abdomen and pelvis was performed following the standard protocol without IV contrast. RADIATION DOSE REDUCTION: This exam was performed according to the departmental dose-optimization program which includes automated exposure control, adjustment of the mA and/or kV according to patient size and/or use of iterative reconstruction  technique. COMPARISON:  None Available. FINDINGS: Lower chest: Posterior mediastinal nodularity centered on the affected vertebrae. Hepatobiliary: No focal liver abnormality.No evidence of biliary obstruction or stone. Pancreas: Unremarkable. Spleen: Unremarkable. Adrenals/Urinary Tract: Negative adrenals. Bilateral hydroureteronephrosis to the level of the distended urinary bladder which shows extensive masslike infiltration at its base, contiguous with severe masslike enlargement of the prostate. Stomach/Bowel: Indistinguishable fat plane between the prostate and anterior wall of the rectum. No bowel obstruction Vascular/Lymphatic: Pelvic lymphadenopathy and pelvic fat infiltration by the prostate cancer bilaterally. Nodularity at the aortic hiatus, likely adenopathy. Reproductive:Bulky masslike enlargement of the prostate invading the bladder base and pelvic fat, up to 13 cm anterior to posterior. Other: No ascites or pneumoperitoneum. Musculoskeletal: Diffuse heterogeneous spine attributed to metastatic disease. 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 Extraosseous tumor growth at least at lower thoracic levels into the posterior mediastinum, suspect epidural infiltration at T11. IMPRESSION: Urinary outlet obstruction with bilateral hydronephrosis due to bulky prostate mass invading the bladder base and pelvic fat. The mass is indistinguishable from the anterior wall of the rectum. There is extensive osseous metastatic disease with suspected epidural tumor at the lower thoracic spine. Electronically Signed   By: Tiburcio Pea M.D.   On: 03/23/2023 07:53     Discharge Instructions: Discharge Instructions     Call MD for:  difficulty breathing, headache or visual disturbances   Complete by: As directed    Call MD for:  hives   Complete by: As directed    Call MD for:  persistant nausea and vomiting   Complete by: As directed    Call MD for:  redness, tenderness, or signs of infection  (pain, swelling, redness, odor or green/yellow discharge around incision site)   Complete by: As directed    Call MD for:  severe uncontrolled pain   Complete by: As directed    Call MD for:  temperature >100.4   Complete by: As directed    Diet - low sodium heart healthy   Complete by: As directed    Discharge instructions   Complete by: As directed    Mr. Alixzander Mcsween,  It was a pleasure taking care of you at [facility]. You were admitted for kidney injury, and suspect prostate cancer.  You are treated with a catheter.  We are discharging you home now that you are doing better. Please follow the following instructions.   1) regarding your Foley catheter, please keep this catheter in until you see your urology doctor.  Your urology doctor office should call you for a follow-up  appointment in 1 to 2 weeks.  2) Regarding your kidney injury, we thought this was because of prostate cancer.  We have biopsied this, and you are to follow-up with your oncologist.  You have an appointment with Dr. Mosetta Putt on 04/07/2023.  This appointment is at 3 PM.  Please show up.  3) Regarding your hospital follow-up, we have scheduled you an appointment with the clinic downstairs in the basement of this hospital.  This appointment is on 04/04/2023.  This appointment is at 9:15 AM.  Please show up.  Your appointment is with Dr. Cliffton Asters.  4) Please stop taking your spironolactone, metformin, and losartan.  5) If you stop having urine in the catheter bag, please call your urology office.  You can also call our office at [phone].  Take care,  Dr. Modena Slater, DO   Increase activity slowly   Complete by: As directed    No wound care   Complete by: As directed        Signed: Modena Slater, DO 03/29/2023, 3:01 PM   Pager: [phone]

## 2023-03-30 ENCOUNTER — Telehealth: Payer: Self-pay | Admitting: Hematology

## 2023-03-30 ENCOUNTER — Telehealth: Payer: Self-pay

## 2023-03-30 NOTE — Telephone Encounter (Signed)
Scheduled per 6/10 sch msg, VM has been left with all appt details/address

## 2023-03-30 NOTE — Transitions of Care (Post Inpatient/ED Visit) (Signed)
   03/30/2023  Name: Pheng Prokop MRN: [id number] DOB: July 31, 1956  Today's TOC FU Call Status: Today's TOC FU Call Status:: Unsuccessul Call (1st Attempt) Unsuccessful Call (1st Attempt) Date: 03/30/23  Attempted to reach the patient regarding the most recent Inpatient/ED visit.  Follow Up Plan: Additional outreach attempts will be made to reach the patient to complete the Transitions of Care (Post Inpatient/ED visit) call.   Jodelle Gross, RN, BSN, CCM Care Management Coordinator [facility] Phone: [phone]/Fax: [phone]

## 2023-03-31 ENCOUNTER — Telehealth: Payer: Self-pay | Admitting: Hematology

## 2023-03-31 ENCOUNTER — Telehealth: Payer: Self-pay

## 2023-03-31 NOTE — Telephone Encounter (Signed)
Patient is aware of upcoming appointment time/dates 

## 2023-03-31 NOTE — Transitions of Care (Post Inpatient/ED Visit) (Signed)
   03/31/2023  Name: Carlos Martin MRN: [id number] DOB: 27-Aug-1956  Today's TOC FU Call Status: Today's TOC FU Call Status:: Unsuccessful Call (2nd Attempt) Unsuccessful Call (2nd Attempt) Date: 03/31/23  Attempted to reach the patient regarding the most recent Inpatient/ED visit.  Follow Up Plan: Additional outreach attempts will be made to reach the patient to complete the Transitions of Care (Post Inpatient/ED visit) call.   Jodelle Gross, RN, BSN, CCM Care Management Coordinator /[facility] Phone: [phone]/Fax: [phone]

## 2023-04-01 ENCOUNTER — Telehealth: Payer: Self-pay

## 2023-04-01 NOTE — Transitions of Care (Post Inpatient/ED Visit) (Signed)
   04/01/2023  Name: Carlos Martin MRN: [id number] DOB: [birth year]  Today's TOC FU Call Status: Today's TOC FU Call Status:: Unsuccessful Call (3rd Attempt) Unsuccessful Call (3rd Attempt) Date: 04/01/23  Attempted to reach the patient regarding the most recent Inpatient/ED visit.  Follow Up Plan: No further outreach attempts will be made at this time. We have been unable to contact the patient.  Jodelle Gross, RN, BSN, CCM Care Management Coordinator [place]/[facility]

## 2023-04-04 ENCOUNTER — Ambulatory Visit (INDEPENDENT_AMBULATORY_CARE_PROVIDER_SITE_OTHER): Payer: 59

## 2023-04-04 VITALS — BP 136/59 | HR 70 | Temp 98.2°F | Ht 69.0 in | Wt 183.3 lb

## 2023-04-04 DIAGNOSIS — I10 Essential (primary) hypertension: Secondary | ICD-10-CM | POA: Diagnosis not present

## 2023-04-04 DIAGNOSIS — H409 Unspecified glaucoma: Secondary | ICD-10-CM | POA: Diagnosis not present

## 2023-04-04 DIAGNOSIS — E119 Type 2 diabetes mellitus without complications: Secondary | ICD-10-CM

## 2023-04-04 DIAGNOSIS — K5900 Constipation, unspecified: Secondary | ICD-10-CM

## 2023-04-04 DIAGNOSIS — D509 Iron deficiency anemia, unspecified: Secondary | ICD-10-CM | POA: Diagnosis not present

## 2023-04-04 DIAGNOSIS — N179 Acute kidney failure, unspecified: Secondary | ICD-10-CM

## 2023-04-04 DIAGNOSIS — N4289 Other specified disorders of prostate: Secondary | ICD-10-CM | POA: Diagnosis not present

## 2023-04-04 DIAGNOSIS — M109 Gout, unspecified: Secondary | ICD-10-CM

## 2023-04-04 DIAGNOSIS — R739 Hyperglycemia, unspecified: Secondary | ICD-10-CM | POA: Diagnosis not present

## 2023-04-04 LAB — POCT GLYCOSYLATED HEMOGLOBIN (HGB A1C): Hemoglobin A1C: 6.8 % — AB (ref 4.0–5.6)

## 2023-04-04 LAB — GLUCOSE, CAPILLARY: Glucose-Capillary: 161 mg/dL — ABNORMAL HIGH (ref 70–99)

## 2023-04-04 MED ORDER — SENNOSIDES-DOCUSATE SODIUM 8.6-50 MG PO TABS
1.0000 | ORAL_TABLET | Freq: Two times a day (BID) | ORAL | 0 refills | Status: AC
Start: 2023-04-04 — End: 2023-05-04

## 2023-04-04 MED ORDER — LATANOPROST 0.005 % OP SOLN
1.0000 [drp] | Freq: Every day | OPHTHALMIC | 1 refills | Status: AC
Start: 2023-04-04 — End: 2023-07-13

## 2023-04-04 MED ORDER — PREDNISONE 20 MG PO TABS
ORAL_TABLET | ORAL | 0 refills | Status: AC
Start: 2023-04-04 — End: 2023-04-10

## 2023-04-04 NOTE — Assessment & Plan Note (Signed)
Patient was recently admitted for obstructive uropathy found to have likely prostate cancer.  PSA significantly elevated.  Tissue pathology pending.  He is scheduled for follow-up with urology and oncology.  He was called by nephrology but missed the call and I instructed him to call them back to schedule an appointment.  Fortunately, his Foley is draining clear-yellow urine.  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  Patient received Firmagon 240 mg injection on 03/24/2023. -Follow-up with urology, nephrology, and oncology -Tissue pathology pending -He endorses good pain control and is not having to use his Dilaudid

## 2023-04-04 NOTE — Progress Notes (Signed)
Internal Medicine Clinic Attending  Case discussed with Dr. White  At the time of the visit.  We reviewed the resident's history and exam and pertinent patient test results.  I agree with the assessment, diagnosis, and plan of care documented in the resident's note.  

## 2023-04-04 NOTE — Patient Instructions (Addendum)
[name], it was a pleasure seeing you today!  Today we discussed: Gout - take prednisone per instructions on bottle. Blood pressure - please be sure to take your two medications each day Possible prostate cancer - please be sure to schedule appointment with nephrology, go to your appointment with oncology (Dr. Latanya Maudlin office), and go to your appointment with urology (Dr. Vevelyn Royals office).  I will call you with any abnormal lab results.  I have ordered the following labs today:  Lab Orders         BMP8+Anion Gap         CBC no Diff         POC Hbg A1C       Tests ordered today:  none  Referrals ordered today:   Referral Orders  No referral(s) requested today     I have ordered the following medication/changed the following medications:   Stop the following medications: Medications Discontinued During This Encounter  Medication Reason   senna-docusate (SENOKOT-S) 8.6-50 MG tablet Reorder     Start the following medications: Meds ordered this encounter  Medications   latanoprost (XALATAN) 0.005 % ophthalmic solution    Sig: Place 1 drop into both eyes at bedtime.    Dispense:  2.5 mL    Refill:  1   senna-docusate (SENOKOT-S) 8.6-50 MG tablet    Sig: Take 1 tablet by mouth 2 (two) times daily.    Dispense:  60 tablet    Refill:  0   predniSONE (DELTASONE) 20 MG tablet    Sig: Take 1 tablet (20 mg total) by mouth daily with breakfast for 3 days, THEN 0.5 tablets (10 mg total) daily with breakfast for 3 days.    Dispense:  4.5 tablet    Refill:  0     Follow-up: 6 months   Please make sure to arrive 15 minutes prior to your next appointment. If you arrive late, you may be asked to reschedule.   We look forward to seeing you next time. Please call our clinic at [phone] if you have any questions or concerns. The best time to call is Monday-Friday from 9am-4pm, but there is someone available 24/7. If after hours or the weekend, call the main hospital  number and ask for the Internal Medicine Resident On-Call. If you need medication refills, please notify your pharmacy one week in advance and they will send [place] a request.  Thank you for letting us take part in your care. Wishing you the best!  Thank you, [name], MD

## 2023-04-04 NOTE — Assessment & Plan Note (Signed)
Patient with recent admission for obstructive uropathy likely secondary to enlarged prostate likely due to malignancy.  He endorses good urine output today.  Foley is in place draining clear-yellow urine and he denies any pain. -Follow-up with urology -Repeat BMP

## 2023-04-04 NOTE — Assessment & Plan Note (Signed)
Patient presents for follow-up of his hypertension.  His blood pressure today is 136/59.  He did not take his blood pressure medicine today which consists of carvedilol 25 mg and amlodipine 10 mg daily.  He denies chest pain or shortness of breath. -Continue carvedilol 25 mg daily -Continue amlodipine 10 mg daily

## 2023-04-04 NOTE — Assessment & Plan Note (Signed)
Patient endorses right foot pain across the metatarsal joints which she feels is consistent with prior bouts of gout.  Exam does not reveal any obvious signs of gouty arthropathy.  However, his recent worsening of renal disease increases risk for this and he states that this feels exactly like previous instances.  I do not feel the small joints are amenable to aspiration to identify crystals.  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 -Prednisone taper

## 2023-04-04 NOTE — Progress Notes (Addendum)
CC: HFU  HPI:  [name] is a 67 y.o. male with past medical history as below who presents for HFU and to establish care. Please see detailed assessment and plan for HPI.  Health History T2DM Hypertension  Medications Amlodipine 10 mg daily Carvedilol 25 mg daily Tylenol Lipitor 40 mg daily Senna-docusate Dilaudid  Family History No history of cancer. No other known health history per patient.  Social History Lives with wife. Works at [employer]. Denies any substance use.  Past Medical History:  Diagnosis Date   Hypertension    Review of Systems:  Please see detailed assessment and plan for pertinent ROS.  Physical Exam:  Vitals:   04/04/23 0940 04/04/23 1035  BP: (!) 145/65 (!) 136/59  Pulse: 75 70  Temp: 98.2 F (36.8 C)   TempSrc: Oral   SpO2: 100%   Weight: 183 lb 4.8 oz (83.1 kg)   Height: 5\' 9"  (1.753 m)    Physical Exam Constitutional:      General: He is not in acute distress. HENT:     Head: Normocephalic and atraumatic.  Eyes:     Extraocular Movements: Extraocular movements intact.  Cardiovascular:     Rate and Rhythm: Normal rate and regular rhythm.     Heart sounds: No murmur heard. Pulmonary:     Effort: Pulmonary effort is normal.     Breath sounds: No wheezing or rales.  Genitourinary:    Comments: Foley bag with clear-yellow urine. Musculoskeletal:     Cervical back: Neck supple.     Right lower leg: No edema.     Left lower leg: No edema.     Comments: No spinous process tenderness.  Metatarsals of right foot without any warmth, erythema, or swelling.  No tophi identified.  Mild tenderness palpation.  Skin:    General: Skin is warm and dry.  Neurological:     Mental Status: He is alert and oriented to person, place, and time.     Cranial Nerves: No cranial nerve deficit.     Sensory: No sensory deficit.     Motor: No weakness.  Psychiatric:        Mood and Affect: Mood normal.        Behavior: Behavior  normal.      Assessment & Plan:   See Encounters Tab for problem based charting.  Essential hypertension, benign Patient presents for follow-up of his hypertension.  His blood pressure today is 136/59.  He did not take his blood pressure medicine today which consists of carvedilol 25 mg and amlodipine 10 mg daily.  He denies chest pain or shortness of breath. -Continue carvedilol 25 mg daily -Continue amlodipine 10 mg daily  AKI (acute kidney injury) [facility]) Patient with recent admission for obstructive uropathy likely secondary to enlarged prostate likely due to malignancy.  He endorses good urine output today.  Foley is in place draining clear-yellow urine and he denies any pain. -Follow-up with urology -Repeat BMP  Prostate mass Patient was recently admitted for obstructive uropathy found to have likely prostate cancer.  PSA significantly elevated.  Tissue pathology pending.  He is scheduled for follow-up with urology and oncology.  He was called by nephrology but missed the call and I instructed him to call them back to schedule an appointment.  Fortunately, his Foley is draining clear-yellow urine.  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  Patient received Firmagon 240 mg injection on 03/24/2023. -Follow-up with urology, nephrology,  and oncology -Tissue pathology pending -He endorses good pain control and is not having to use his Dilaudid  Gout Patient endorses right foot pain across the metatarsal joints which she feels is consistent with prior bouts of gout.  Exam does not reveal any obvious signs of gouty arthropathy.  However, his recent worsening of renal disease increases risk for this and he states that this feels exactly like previous instances.  I do not feel the small joints are amenable to aspiration to identify crystals.  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 -Prednisone taper  Hyperglycemia Per  chart review, patient was previously diagnosed with diabetes.  However, I do not see an A1c to corroborate this and he was recently discontinued his metformin.  His A1c today is 6.8 and he had been taking metformin up until recent hospitalization. -Recommend discussing restarting metformin at follow-up visit after discussing goals of care given patient's likely metastatic prostate cancer  Patient discussed with Dr. Antony Contras

## 2023-04-04 NOTE — Assessment & Plan Note (Addendum)
Per chart review, patient was previously diagnosed with diabetes.  However, I do not see an A1c to corroborate this and he was recently discontinued his metformin.  His A1c today is 6.8 and he had been taking metformin up until recent hospitalization. -Recommend discussing restarting metformin at follow-up visit after discussing goals of care given patient's likely metastatic prostate cancer

## 2023-04-05 LAB — BMP8+ANION GAP

## 2023-04-05 LAB — CBC
Hematocrit: 30.7 % — ABNORMAL LOW (ref 37.5–51.0)
MCH: 24.2 pg — ABNORMAL LOW (ref 26.6–33.0)
MCHC: 30.3 g/dL — ABNORMAL LOW (ref 31.5–35.7)
Platelets: 344 10*3/uL (ref 150–450)
RDW: 19.5 % — ABNORMAL HIGH (ref 11.6–15.4)

## 2023-04-05 LAB — SURGICAL PATHOLOGY

## 2023-04-06 LAB — BMP8+ANION GAP
Anion Gap: 15 mmol/L (ref 10.0–18.0)
BUN/Creatinine Ratio: 13 (ref 10–24)
Calcium: 9 mg/dL (ref 8.6–10.2)
Chloride: 108 mmol/L — ABNORMAL HIGH (ref 96–106)
Potassium: 4.1 mmol/L (ref 3.5–5.2)
eGFR: 92 mL/min/{1.73_m2} (ref >59)

## 2023-04-06 LAB — CBC
Hemoglobin: 9.3 g/dL — ABNORMAL LOW (ref 13.0–17.7)
MCV: 80 fL (ref 79–97)
RBC: 3.84 x10E6/uL — ABNORMAL LOW (ref 4.14–5.80)
WBC: 8.5 10*3/uL (ref 3.4–10.8)

## 2023-04-07 ENCOUNTER — Ambulatory Visit: Payer: Managed Care, Other (non HMO) | Admitting: Hematology

## 2023-04-07 ENCOUNTER — Other Ambulatory Visit: Payer: Managed Care, Other (non HMO)

## 2023-04-08 ENCOUNTER — Encounter: Payer: Self-pay | Admitting: Hematology

## 2023-04-08 ENCOUNTER — Inpatient Hospital Stay: Payer: 59 | Attending: Hematology | Admitting: Hematology

## 2023-04-08 ENCOUNTER — Other Ambulatory Visit: Payer: Self-pay

## 2023-04-08 ENCOUNTER — Inpatient Hospital Stay: Payer: 59

## 2023-04-08 ENCOUNTER — Other Ambulatory Visit (HOSPITAL_COMMUNITY): Payer: Self-pay

## 2023-04-08 VITALS — BP 134/64 | HR 65 | Temp 98.3°F | Resp 18 | Ht 68.0 in | Wt 180.3 lb

## 2023-04-08 DIAGNOSIS — C7951 Secondary malignant neoplasm of bone: Secondary | ICD-10-CM

## 2023-04-08 DIAGNOSIS — C61 Malignant neoplasm of prostate: Secondary | ICD-10-CM | POA: Diagnosis not present

## 2023-04-08 DIAGNOSIS — D509 Iron deficiency anemia, unspecified: Secondary | ICD-10-CM

## 2023-04-08 LAB — CBC WITH DIFFERENTIAL/PLATELET
Abs Immature Granulocytes: 0.04 10*3/uL (ref 0.00–0.07)
Basophils Absolute: 0.1 10*3/uL (ref 0.0–0.1)
Basophils Relative: 1 %
Eosinophils Absolute: 0.2 10*3/uL (ref 0.0–0.5)
Eosinophils Relative: 2 %
HCT: 27.8 % — ABNORMAL LOW (ref 39.0–52.0)
Hemoglobin: 9.2 g/dL — ABNORMAL LOW (ref 13.0–17.0)
Immature Granulocytes: 0 %
Lymphocytes Relative: 28 %
Lymphs Abs: 3.2 10*3/uL (ref 0.7–4.0)
MCH: 24.5 pg — ABNORMAL LOW (ref 26.0–34.0)
MCHC: 33.1 g/dL (ref 30.0–36.0)
MCV: 73.9 fL — ABNORMAL LOW (ref 80.0–100.0)
Monocytes Absolute: 0.8 10*3/uL (ref 0.1–1.0)
Monocytes Relative: 7 %
Neutro Abs: 7.2 10*3/uL (ref 1.7–7.7)
Neutrophils Relative %: 62 %
Platelets: 315 10*3/uL (ref 150–400)
RBC: 3.76 MIL/uL — ABNORMAL LOW (ref 4.22–5.81)
RDW: 19.1 % — ABNORMAL HIGH (ref 11.5–15.5)
WBC: 11.5 10*3/uL — ABNORMAL HIGH (ref 4.0–10.5)
nRBC: 0 % (ref 0.0–0.2)

## 2023-04-08 LAB — COMPREHENSIVE METABOLIC PANEL
ALT: 22 U/L (ref 0–44)
AST: 18 U/L (ref 15–41)
Albumin: 3.8 g/dL (ref 3.5–5.0)
Alkaline Phosphatase: 106 U/L (ref 38–126)
Anion gap: 9 (ref 5–15)
BUN: 39 mg/dL — ABNORMAL HIGH (ref 8–23)
CO2: 21 mmol/L — ABNORMAL LOW (ref 22–32)
Calcium: 9.7 mg/dL (ref 8.9–10.3)
Chloride: 105 mmol/L (ref 98–111)
Creatinine, Ser: 1.95 mg/dL — ABNORMAL HIGH (ref 0.61–1.24)
GFR, Estimated: 38 mL/min — ABNORMAL LOW (ref >60)
Glucose, Bld: 88 mg/dL (ref 70–99)
Potassium: 4.6 mmol/L (ref 3.5–5.1)
Sodium: 135 mmol/L (ref 135–145)
Total Bilirubin: 0.3 mg/dL (ref 0.3–1.2)
Total Protein: 7.9 g/dL (ref 6.5–8.1)

## 2023-04-08 LAB — FERRITIN: Ferritin: 594 ng/mL — ABNORMAL HIGH (ref 24–336)

## 2023-04-08 NOTE — Progress Notes (Signed)
Introduced myself to the patient as the prostate nurse navigator.  He is here to discuss his treatment options.  Patient has multiple barriers including financial concerns, insurance, and coordinating care for appointments and imaging.  RN will work with patient to address barriers.  I gave him my business card and asked him to call me with questions or concerns.  Verbalized understanding.   PSMA PET pending, RN will follow up to ensure it's scheduled.  Patient will be presented at next tumor board post PSMA PET results for review.   Plan of care in progress.

## 2023-04-08 NOTE — Progress Notes (Unsigned)
[facility]   Telephone:([phone] [phone]   Clinic New Consult Note   Patient Care Team: Default, Provider, MD as PCP - General  Date of Service:  04/08/2023   CHIEF COMPLAINTS/PURPOSE OF CONSULTATION:  Prostate Cancer  REFERRING PHYSICIAN:  Heloise Purpura ,MD  ASSESSMENT & PLAN:  Carlos Martin is a 67 y.o.  male with a history of   1. Prostate cancer metastatic to bone (HCC), stge IV -Pt presented with worsening back pain and AKI from urinary obstruction. Bone scan and spinal MRI showed diffuse bone mets, PSA 524. Bone biopsy confirmed metastatic prostate cancer.  -I reviewed the incurable nature of his disease and treatment options and need for long treatment -he started ADT in hospital and his back pain has much improved, will continue ADT and change to Eligard on  7/8. I discussed the benefit and side effects with ADT, he agrees to proceed.  -I recommend PSMA scan for staging and to rule out visceral metastasis  -he will continue f/u with urology Dr. Laverle Patter  -I recommend systemic therapy with zytiga and low dose prednisone, benefit and side effects reviewed with him, he voiced good understanding and agrees with the plan -I also discussed the role of chemo, and other treatment options such as targeted therapy, Pluvicto etc -I recommend genetic testing and will refer him  -will present his case in GU conference in a few weeks to review radiation therapy   2. Diffuse bone mets -his pain has improved with prostate cancer treatment and pain meds  -I recommend Xgeva injection, will start in next month. If has still has renal insufficiency, then will give every 2-3 months     PLAN:  - I reviewed Biopsy results with PT -I recommend PSMA PET scan -I order PSMA PET in 2 weeks - I discuss hormonal therapy every 3-6 months Eligard Injection, start on 7/8 - I recommend Oral Zytiga w/ low dose prednisone, I will call in today  -pt agree to proceed with  treatment -tumor board discussion in a few weeks  -lab and f/u in a month    Oncology History Overview Note   Cancer Staging  Prostate cancer metastatic to bone [facility]) Staging form: Prostate, AJCC 8th Edition - Clinical stage from 03/28/2023: Stage IVB (cTX, cN0, pM1, PSA: 524) - Signed by [name], MD on 04/08/2023 Prostate specific antigen (PSA) range: 20 or greater     Prostate cancer metastatic to bone (HCC)  03/28/2023 Cancer Staging   Staging form: Prostate, AJCC 8th Edition - Clinical stage from 03/28/2023: Stage IVB (cTX, cN0, pM1, PSA: 524) - Signed by [name], MD on 04/08/2023 Prostate specific antigen (PSA) range: 20 or greater   03/28/2023 Pathology Results     FINAL MICROSCOPIC DIAGNOSIS:  A. RIGHT ILIAC, BONE BIOPSY: Metastatic poorly differentiated prostatic adenocarcinoma  COMMENT:  Sections show fragmented reactive and sclerotic trabecular bone with associated desmoplastic and fibrotic stroma with abundant associated blood clot.  Within the background there are fragments of apparent necrotic tumor additionally there are loosely cohesive nests of atypical cells with a marked nuclear cytoplasmic ratio and an enlarged round to oval irregular hyperchromatic nuclei. Four immunohistochemical stains performed with adequate control to elucidate the histogenesis of this tumor.  The cells are focally and weakly positive for pancytokeratin (AE1/AE3).  The cells are positive for the prostate marker prostein and show focal weak patchy positivity for prostate-specific antigen.  The cells are negative for the neuroendocrine marker synaptophysin.  This immunohistochemical  profile supports the above diagnosis.    04/08/2023 Initial Diagnosis   Prostate cancer metastatic to bone (HCC)      HISTORY OF PRESENTING ILLNESS:  Carlos Martin 67 y.o. male with PMH of HTN, DM and hyperlipidemia who is a here because of recently diagnosed metastatic prostate Cancer. The patient was  referred by Heloise Purpura , MD. The patient presents to the clinic today alone.  Pt presented to the ED with worsening back pain over the last 2 months as well as difficulty with urination over the last 3 to 4 months. Patient states that over the last 3 to 4 months he has noted difficulty urinating and has urinary urgency but is unable to incomplete BMP of bladder and has only small amount of urine each time. [facility] a few weeks ago and was given Flexeril which did help his pain but only temporarily and his pain is since worsened. He also complains of approximately 15 pound weight loss over the last month which was unintentional. In the ED, patient was noted to have an AKI with a creatinine of 3.3 with associated hyperkalemia of 6.6 and severe microcytic anemia with a hemoglobin of 5.9. CT renal stone done in the ED was significant for urinary outlet obstruction with bilateral hydronephrosis due to bulky prostate mass invading bladder wall. Spinal MRI and bone scan showed diffuse bone metastasis. Pt was seen by nephology and urology, foley placed, IVF given, pt's AKI resolved. He received 2u blood and IV iron with ferric gluconate 750mg . He  underwent bone biopsy which showed metastatic prostate cancer, PSA 524. Pt was given Firmagon 240mg  loading dose on 03/24/2023 and discharged on 03/29/2023.  Today Pt state that he  feels a lot  better since  going to the hospital. Pt state that he was very fatigue prior to going to the hospital, this has much improved. His pain is also controlled with pain meds.    He has a PMHx of.... 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   REVIEW OF SYSTEMS:     Constitutional: (-) Denies fevers, chills or abnormal night sweats Eyes:(-) Denies blurriness of vision, double vision or watery eyes Ears, nose, mouth, throat, and face: Denies mucositis or sore throat Respiratory:  (-)Denies cough, dyspnea or wheezes Cardiovascular: (-)Denies palpitation,(-) chest discomfort or lower extremity swelling Gastrointestinal: (-) Denies nausea, heartburn or change in bowel habits Skin: Denies abnormal skin rashes Lymphatics:(-) Denies new lymphadenopathy or easy bruising Neurological:(-) Denies numbness, tingling or new weaknesses Behavioral/Psych: (-) Mood is stable, no new changes  All other systems were reviewed with the patient and are negative.   MEDICAL HISTORY:  Past Medical History:  Diagnosis Date   Hypertension     SURGICAL HISTORY: History reviewed. No pertinent surgical history.  SOCIAL HISTORY: Social History   Socioeconomic History   Marital status: Married    Spouse name: Not on file   Number of children: 4   Years of education: Not on file   Highest education level: Not on file  Occupational History   Not on file  Tobacco Use   Smoking status: Never   Smokeless tobacco: Not on file  Substance and Sexual Activity   Alcohol use: No   Drug use: No   Sexual activity: Not on file  Other Topics Concern   Not on file  Social History Narrative   Not on file   Social  Determinants of Health   Financial Resource Strain: Not on file  Food Insecurity: No Food Insecurity (03/23/2023)   Hunger Vital Sign    Worried About Running Out of Food in the Last Year: Never true    Ran Out of Food in the Last Year: Never true  Transportation Needs: No Transportation Needs (03/23/2023)   PRAPARE - Administrator, Civil Service (Medical): No    Lack of Transportation (Non-Medical): No  Physical Activity: Not on file  Stress: Not on file  Social Connections: Not on file  Intimate Partner Violence: Not At Risk (03/23/2023)   Humiliation, Afraid, Rape, and Kick questionnaire    Fear of Current or Ex-Partner: No    Emotionally Abused: No    Physically Abused: No    Sexually Abused: No    FAMILY HISTORY: History reviewed. No pertinent family  history.  ALLERGIES:  has No Known Allergies.  MEDICATIONS:  Current Outpatient Medications  Medication Sig Dispense Refill   acetaminophen (TYLENOL) 500 MG tablet Take 1,000 mg by mouth 2 (two) times daily as needed for moderate pain, headache or fever.     amLODipine (NORVASC) 10 MG tablet Take 10 mg by mouth daily.     atorvastatin (LIPITOR) 40 MG tablet Take 40 mg by mouth daily.     bisacodyl (DULCOLAX) 5 MG EC tablet Take 2 tablets (10 mg total) by mouth daily as needed for moderate constipation. 30 tablet 0   carvedilol (COREG) 25 MG tablet Take 25 mg by mouth daily.     HYDROmorphone (DILAUDID) 2 MG tablet Take 1 tablet (2 mg total) by mouth every 12 (twelve) hours as needed for severe pain. 14 tablet 0   latanoprost (XALATAN) 0.005 % ophthalmic solution Place 1 drop into both eyes at bedtime. 2.5 mL 1   predniSONE (DELTASONE) 20 MG tablet Take 1 tablet (20 mg total) by mouth daily with breakfast for 3 days, THEN 0.5 tablets (10 mg total) daily with breakfast for 3 days. 4.5 tablet 0   senna-docusate (SENOKOT-S) 8.6-50 MG tablet Take 1 tablet by mouth 2 (two) times daily. 60 tablet 0   No current facility-administered medications for this visit.    PHYSICAL EXAMINATION: ECOG PERFORMANCE STATUS: 1 - Symptomatic but completely ambulatory  Vitals:   04/08/23 1146  BP: 134/64  Pulse: 65  Resp: 18  Temp: 98.3 F (36.8 C)  SpO2: 100%   Filed Weights   04/08/23 1146  Weight: 180 lb 4.8 oz (81.8 kg)     GENERAL:alert, no distress and comfortable SKIN: skin color normal, no rashes or significant lesions EYES: normal, Conjunctiva are pink and non-injected, sclera clear  NEURO: alert & oriented x 3 with fluent speech NECK: (-)supple, thyroid normal size, non-tender,(-) no palpable lymphadenopathy in the cervical, axillary  LUNGS: (-) clear to auscultation and percussion with normal breathing effort HEART:(-)  regular rate & rhythm and no murmurs and (-) no lower extremity  edema ABDOMEN:(-)abdomen soft, (-) non-tender and normal bowel sounds   LABORATORY DATA:  I have reviewed the data as listed    Latest Ref Rng & Units 04/04/2023   10:42 AM 03/29/2023   12:49 AM 03/28/2023    3:03 PM  CBC  WBC 3.4 - 10.8 x10E3/uL 8.5  8.1  8.2   Hemoglobin 13.0 - 17.7 g/dL 9.3  7.2  8.2   Hematocrit 37.5 - 51.0 % 30.7  22.6  25.3   Platelets 150 - 450 x10E3/uL 344  315  322  Latest Ref Rng & Units 04/04/2023   10:42 AM 03/29/2023   12:49 AM 03/28/2023   12:58 AM  CMP  Glucose 70 - 99 mg/dL 478  295  621   BUN 8 - 27 mg/dL 12  46  38   Creatinine 0.76 - 1.27 mg/dL 3.08  6.57  8.46   Sodium 134 - 144 mmol/L 140  136  135   Potassium 3.5 - 5.2 mmol/L 4.1  4.6  4.5   Chloride 96 - 106 mmol/L 108  101  102   CO2 20 - 29 mmol/L 17  19  22    Calcium 8.6 - 10.2 mg/dL 9.0  8.9  8.6      RADIOGRAPHIC STUDIES: I have personally reviewed the radiological images as listed and agreed with the findings in the report. US RENAL  Result Date: 03/28/2023 CLINICAL DATA:  Acute kidney injury.  Hydronephrosis. EXAM: RENAL / URINARY TRACT ULTRASOUND COMPLETE COMPARISON:  Renal ultrasound 03/25/2023 FINDINGS: Right Kidney: Renal measurements: 9.7 x 5.1 x 6.6 cm = volume: 227 mL. Unchanged mild to moderate hydronephrosis. No focal renal abnormality or stone. Normal parenchymal echogenicity. Left Kidney: Renal measurements: 12.5 x 6 x 6 cm = volume: 237 mL. Unchanged mild hydronephrosis. No focal renal abnormality or stone. Normal parenchymal echogenicity. Bladder: Decompressed by Foley catheter. Suspect circumferential bladder wall thickening. Other: None. IMPRESSION: 1. Unchanged bilateral hydronephrosis, right greater than left. 2. Urinary bladder decompressed by Foley catheter. Suspected bladder wall thickening. Electronically Signed   By: Narda Rutherford M.D.   On: 03/28/2023 16:39   CT BIOPSY  Result Date: 03/28/2023 INDICATION: 67 year old male presents for biopsy of right  pelvic bone mass, presumably metastatic prostate adenocarcinoma. EXAM: CT BIOPSY MEDICATIONS: None. ANESTHESIA/SEDATION: Moderate (conscious) sedation was employed during this procedure. A total of Versed 1.0 mg and Fentanyl 75 mcg was administered intravenously. Moderate Sedation Time: 21 minutes. The patient's level of consciousness and vital signs were monitored continuously by radiology nursing throughout the procedure under my direct supervision. FLUOROSCOPY TIME:  CT COMPLICATIONS: None PROCEDURE: Informed written consent was obtained from the patient after a thorough discussion of the procedural risks, benefits and alternatives. All questions were addressed. Maximal Sterile Barrier Technique was utilized including caps, mask, sterile gowns, sterile gloves, sterile drape, hand hygiene and skin antiseptic. A timeout was performed prior to the initiation of the procedure. Patient was position prone. Scout CT of the pelvis was performed for surgical planning purposes. The posterior pelvis was prepped with Chlorhexidine in a sterile fashion, and a sterile drape was applied covering the operative field. A sterile gown and sterile gloves were used for the procedure. Local anesthesia was provided with 1% Lidocaine. Lytic lesion within the right iliac bone was targeted for biopsy. The skin and subcutaneous tissues were infiltrated with 1% lidocaine without epinephrine. A small stab incision was made with an 11 blade scalpel, and an 11 gauge Murphy needle was advanced with CT guidance to the posterior cortex. Once the needle was in position within the cortex, a CT was acquired confirming a trajectory. We then performed multiple coaxial core biopsy, with the first needle position confirmed with CT imaging directly through the lesion. Multiple core biopsy were placed into specimen cup. Finally the 11 gauge Murphy needle was advanced through the lesion and the core biopsy was retrieved. Manual pressure was used for  hemostasis and a sterile dressing was placed. No complications were encountered no significant blood loss was encountered. Patient tolerated the procedure well and remained  hemodynamically stable throughout. IMPRESSION: Status post CT-guided biopsy of right iliac bone lytic lesion. Signed, Yvone Neu. Miachel Roux, RPVI [facility] Electronically Signed   By: Gilmer Mor D.O.   On: 03/28/2023 12:50   US RENAL  Result Date: 03/25/2023 CLINICAL DATA:  Hydronephrosis EXAM: RENAL / URINARY TRACT ULTRASOUND COMPLETE COMPARISON:  Renal stone protocol CT 03/23/2023 FINDINGS: Right Kidney: Renal measurements: 10.2 x 5.6 x 6.5 cm = volume: 197 mL. Echogenicity within normal limits. Mild to moderate right hydronephrosis is unchanged since prior CT. Left Kidney: Renal measurements: 12.4 x 7.0 x 5.7 cm = volume: 260 mL. Echogenicity within normal limits. Mild left hydronephrosis is unchanged from prior exam. Bladder: Collapsed around Foley catheter balloon. Other: None. IMPRESSION: Unchanged bilateral hydronephrosis. Electronically Signed   By: Acquanetta Belling M.D.   On: 03/25/2023 10:27   NM Bone Scan Whole Body  Result Date: 03/24/2023 CLINICAL DATA:  Prostate cancer. EXAM: NUCLEAR MEDICINE WHOLE BODY BONE SCAN TECHNIQUE: Whole body anterior and posterior images were obtained approximately 3 hours after intravenous injection of radiopharmaceutical. RADIOPHARMACEUTICALS:  21.8 mCi Technetium-37m MDP IV COMPARISON:  MRI spine 03/23/2023.  Abdomen pelvis CT 03/23/2019 FINDINGS: Physiologic distribution of tracer along the kidneys and bladder. There are areas of degenerative type uptake identified along the shoulders, spine and sacroiliac regions. However there is multifocal areas of osseous metastatic disease. This includes along both proximal femurs, scattered along the pelvis more right-sided than left, multiple bilateral ribs, scapula sternum and thoracolumbar  spine. Some areas as well along the lower cervical spine. IMPRESSION: Multifocal osseous metastatic disease. Areas include numerous bilateral ribs, sternum, scapula, spine, pelvis and proximal femurs Electronically Signed   By: Karen Kays M.D.   On: 03/24/2023 15:36   MR CERVICAL SPINE W WO CONTRAST  Result Date: 03/24/2023 CLINICAL DATA:  Paraspinal mass/tumor, cervical spine Epidural Expansion of tumor at c5. EXAM: MRI CERVICAL SPINE WITHOUT AND WITH CONTRAST TECHNIQUE: Multiplanar and multiecho pulse sequences of the cervical spine, to include the craniocervical junction and cervicothoracic junction, were obtained without and with intravenous contrast. CONTRAST:  8mL GADAVIST GADOBUTROL 1 MMOL/ML IV SOLN COMPARISON:  MRI thoracic spine 03/23/2023. FINDINGS: Alignment: Normal. Vertebrae: Diffuse marrow replacing, enhancing disease throughout the cervical spine, greatest from C4-C6. Ventral epidural extension of tumor from C4-C6, extending into the right neural foramen at C4-5 (axial image 22 series 10) and left neural foramen C5-6 (axial image 27 series 10). Bowing of the posterior cortex at C5 on left results deformity of the left ventral cord with associated T2 hyperintensity, favored to reflect edema (axial image 26 series 6). Cord: As above. Posterior Fossa, vertebral arteries, paraspinal tissues: Diffuse marrow replacing, enhancing disease in the clivus and occipital condyles without evidence of epidural extension. Disc levels: C2-C3: No disc herniation or spinal canal stenosis. Facet arthropathy and uncovertebral joint spurring results in moderate bilateral neural foraminal narrowing. C3-C4: Small disc bulge without spinal canal stenosis. Right-greater-than-left facet arthropathy and uncovertebral joint spurring results in severe right neural foraminal narrowing. C4-C5: Ventral epidural and perineural extension of tumor along the right C5 nerve and C4-5 neural foramen results in mild deformity of the  right ventral cord and severe right neural foraminal narrowing. Posterolateral bowing of the C5 vertebral body cortex results in moderate left neural foraminal narrowing. C5-C6: Posterolateral bowing of the C5 posterior cortex results in moderate left neural foraminal narrowing and deformity of the left ventral cord with associated T2 hyperintensity, as above. C6-C7: Disc bulge  results in mild spinal canal stenosis. Facet arthropathy and uncovertebral joint spurring contribute to moderate bilateral neural foraminal narrowing. C7-T1: No disc herniation, spinal canal stenosis or neural foraminal narrowing. Mild left facet arthropathy. IMPRESSION: 1. Diffuse marrow replacing, enhancing disease throughout the cervical spine, greatest from C4-C6. 2. Ventral epidural and perineural extension of tumor from C4-C6, extending into the right neural foramen at C4-5 and left neural foramen at C5-6. 3. Ventral epidural and perineural extension of tumor along the right C5 nerve and C4-5 neural foramen results in mild deformity of the right ventral cord and severe right neural foraminal narrowing. 4. Posterolateral bowing of the C5 vertebral body cortex results in moderate left neural foraminal narrowing at C4-5 and C5-6, as well as deformity of the left ventral cord with edema at the C5-6 level. 5. Diffuse marrow replacing, enhancing disease in the clivus and occipital condyles without evidence of epidural extension. 6. Multilevel cervical spondylosis, as described above. Electronically Signed   By: Orvan Falconer M.D.   On: 03/24/2023 09:38   MR THORACIC SPINE W WO CONTRAST  Result Date: 03/23/2023 CLINICAL DATA:  Metastatic disease evaluation EXAM: MRI THORACIC AND LUMBAR SPINE WITHOUT AND WITH CONTRAST TECHNIQUE: Multiplanar and multiecho pulse sequences of the thoracic and lumbar spine were obtained without and with intravenous contrast. CONTRAST:  8mL GADAVIST GADOBUTROL 1 MMOL/ML IV SOLN COMPARISON:  Same day CT renal  stone protocol FINDINGS: MRI THORACIC SPINE FINDINGS Alignment:  Physiologic. Vertebrae: There is diffuse osseous metastatic disease with metastatic lesions at nearly every thoracic vertebral body. There no evidence of a severe pathologic compression deformity. There is evidence of epidural extension of tumor at the T6 level (series 16, image 9), the T8 level (series 17, image 32), T9-T11 levels (series 16, image 11), T12 level (series 17, image 50). Metastatic lesions are also seen throughout the cervical spine with possible epidural extension of tumor at the C5 level. Note these levels are only seen on the counting sequences and are incompletely assessed. Cord: Normal signal and morphology. Paraspinal and other soft tissues: Negative. Disc levels: There is severe spinal canal stenosis at the T9-T11 levels due to the presence of epidural tumor. MRI LUMBAR SPINE FINDINGS Segmentation:  Standard. Alignment:  Grade 1 anterolisthesis of L5 on S1. Vertebrae: Metastatic lesions are seen at every lumbar and visualized sacral vertebral body Conus medullaris: Extends to the L2-L3 disc space level. There is a fatty filum terminale Paraspinal and other soft tissues: See same day CT Renal Stone protocol exam for additional findings. There is an enlarged right common iliac chain lymph node, worrisome for metastatic disease. Contrast-enhancing lesions are seen in the bilateral iliac bones (series 19, image 39) Disc levels: No evidence of high-grade spinal canal stenosis no evidence of epidural spread of tumor in the lumbar spine IMPRESSION: 1. Diffuse osseous metastatic disease with metastatic lesions at nearly every cervical, thoracic, and lumbar vertebral body. 2. Epidural extension of tumor at the T6, T8, T9-T11, and T12 levels resulting in severe spinal canal stenosis at the T9-T11 levels. There may also be epidural extension of tumor at the C5 level, but this level is incompletely imaged. Consider further evaluation with a  dedicated cervical spine MRI with and without contrast. 3. No evidence of epidural spread of tumor in the lumbar spine. 4. Enlarged right common iliac chain lymph node, worrisome for metastatic disease. 5. Contrast-enhancing lesions in the bilateral iliac bones, worrisome for additional sites of osseous metastatic disease. Electronically Signed   By: Sandria Senter  Celine Mans M.D.   On: 03/23/2023 14:01   MR Lumbar Spine W Wo Contrast  Result Date: 03/23/2023 CLINICAL DATA:  Metastatic disease evaluation EXAM: MRI THORACIC AND LUMBAR SPINE WITHOUT AND WITH CONTRAST TECHNIQUE: Multiplanar and multiecho pulse sequences of the thoracic and lumbar spine were obtained without and with intravenous contrast. CONTRAST:  8mL GADAVIST GADOBUTROL 1 MMOL/ML IV SOLN COMPARISON:  Same day CT renal stone protocol FINDINGS: MRI THORACIC SPINE FINDINGS Alignment:  Physiologic. Vertebrae: There is diffuse osseous metastatic disease with metastatic lesions at nearly every thoracic vertebral body. There no evidence of a severe pathologic compression deformity. There is evidence of epidural extension of tumor at the T6 level (series 16, image 9), the T8 level (series 17, image 32), T9-T11 levels (series 16, image 11), T12 level (series 17, image 50). Metastatic lesions are also seen throughout the cervical spine with possible epidural extension of tumor at the C5 level. Note these levels are only seen on the counting sequences and are incompletely assessed. Cord: Normal signal and morphology. Paraspinal and other soft tissues: Negative. Disc levels: There is severe spinal canal stenosis at the T9-T11 levels due to the presence of epidural tumor. MRI LUMBAR SPINE FINDINGS Segmentation:  Standard. Alignment:  Grade 1 anterolisthesis of L5 on S1. Vertebrae: Metastatic lesions are seen at every lumbar and visualized sacral vertebral body Conus medullaris: Extends to the L2-L3 disc space level. There is a fatty filum terminale Paraspinal and other  soft tissues: See same day CT Renal Stone protocol exam for additional findings. There is an enlarged right common iliac chain lymph node, worrisome for metastatic disease. Contrast-enhancing lesions are seen in the bilateral iliac bones (series 19, image 39) Disc levels: No evidence of high-grade spinal canal stenosis no evidence of epidural spread of tumor in the lumbar spine IMPRESSION: 1. Diffuse osseous metastatic disease with metastatic lesions at nearly every cervical, thoracic, and lumbar vertebral body. 2. Epidural extension of tumor at the T6, T8, T9-T11, and T12 levels resulting in severe spinal canal stenosis at the T9-T11 levels. There may also be epidural extension of tumor at the C5 level, but this level is incompletely imaged. Consider further evaluation with a dedicated cervical spine MRI with and without contrast. 3. No evidence of epidural spread of tumor in the lumbar spine. 4. Enlarged right common iliac chain lymph node, worrisome for metastatic disease. 5. Contrast-enhancing lesions in the bilateral iliac bones, worrisome for additional sites of osseous metastatic disease. Electronically Signed   By: Lorenza Cambridge M.D.   On: 03/23/2023 14:01   CT Renal Stone Study  Result Date: 03/23/2023 CLINICAL DATA:  Abdominal/flank pain with stone suspected EXAM: CT ABDOMEN AND PELVIS WITHOUT CONTRAST TECHNIQUE: Multidetector CT imaging of the abdomen and pelvis was performed following the standard protocol without IV contrast. RADIATION DOSE REDUCTION: This exam was performed according to the departmental dose-optimization program which includes automated exposure control, adjustment of the mA and/or kV according to patient size and/or use of iterative reconstruction technique. COMPARISON:  None Available. FINDINGS: Lower chest: Posterior mediastinal nodularity centered on the affected vertebrae. Hepatobiliary: No focal liver abnormality.No evidence of biliary obstruction or stone. Pancreas:  Unremarkable. Spleen: Unremarkable. Adrenals/Urinary Tract: Negative adrenals. Bilateral hydroureteronephrosis to the level of the distended urinary bladder which shows extensive masslike infiltration at its base, contiguous with severe masslike enlargement of the prostate. Stomach/Bowel: Indistinguishable fat plane between the prostate and anterior wall of the rectum. No bowel obstruction Vascular/Lymphatic: Pelvic lymphadenopathy and pelvic fat infiltration by the prostate  cancer bilaterally. Nodularity at the aortic hiatus, likely adenopathy. Reproductive:Bulky masslike enlargement of the prostate invading the bladder base and pelvic fat, up to 13 cm anterior to posterior. Other: No ascites or pneumoperitoneum. Musculoskeletal: Diffuse heterogeneous spine attributed to metastatic disease. 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 Extraosseous tumor growth at least at lower thoracic levels into the posterior mediastinum, suspect epidural infiltration at T11. IMPRESSION: Urinary outlet obstruction with bilateral hydronephrosis due to bulky prostate mass invading the bladder base and pelvic fat. The mass is indistinguishable from the anterior wall of the rectum. There is extensive osseous metastatic disease with suspected epidural tumor at the lower thoracic spine. Electronically Signed   By: Tiburcio Pea M.D.   On: 03/23/2023 07:53     No orders of the defined types were placed in this encounter.   All questions were answered. The patient knows to call the clinic with any problems, questions or concerns. The total time spent in the appointment was 60 minutes.     [name], MD 04/08/2023 12:02 PM  I, Monica Martinez am acting as scribe for [name], MD.

## 2023-04-08 NOTE — Addendum Note (Signed)
Addended by: Deberah Castle on: 04/08/2023 01:39 PM   Modules accepted: Orders

## 2023-04-10 LAB — PROSTATE-SPECIFIC AG, SERUM (LABCORP): Prostate Specific Ag, Serum: 44 ng/mL — ABNORMAL HIGH (ref 0.0–4.0)

## 2023-04-11 ENCOUNTER — Telehealth: Payer: Self-pay | Admitting: Hematology

## 2023-04-11 ENCOUNTER — Inpatient Hospital Stay: Payer: 59 | Admitting: Licensed Clinical Social Worker

## 2023-04-11 DIAGNOSIS — C61 Malignant neoplasm of prostate: Secondary | ICD-10-CM

## 2023-04-11 NOTE — Progress Notes (Signed)
RN spoke with patient to confirm he is aware of upcoming PSMA PET on 7/15.  Patient is aware.  He was contacted by social workers today and plans to apply for Medicaid to help with insurance coverage.     RN placed referral for urology for management of his foley catheter.    Pending injection appointment, and authorization for Zytiga.  RN will continue to follow.   Plan of care in progress.

## 2023-04-11 NOTE — Telephone Encounter (Signed)
Patient has currently declined the injection appointment, patient is also aware of upcoming appointment time/dates.

## 2023-04-11 NOTE — Progress Notes (Signed)
[facility] Clinical Social Work  Initial Assessment   Carlos Martin is a 67 y.o. year old male contacted by phone. Clinical Social Work was referred by medical provider for assessment of psychosocial needs.   SDOH (Social Determinants of Health) assessments performed: Yes   SDOH Screenings   Food Insecurity: No Food Insecurity (03/23/2023)  Housing: Low Risk  (03/23/2023)  Transportation Needs: No Transportation Needs (03/23/2023)  Utilities: Not At Risk (03/23/2023)  Depression (PHQ2-9): Low Risk  (04/04/2023)  Tobacco Use: Unknown (04/08/2023)     Distress Screen completed: No     No data to display            Family/Social Information:  Housing Arrangement: patient lives with his wife and 1 of their adult sons. Family members/support persons in your life? Pt states he does not have a great deal of support outside of his immediate family.  Pt has 4 children in total, 3 of which reportedly do not live close by. Transportation concerns: no  Employment: Out of work due to pain , pt states he is not able to work at this time and does not have any benefits.   Pt is presently paying out of pocket for [employer] insurance.  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 yes Concerns about diagnosis and/or treatment: Losing my job and/or losing income, Overwhelmed by information, How I will pay for the services I need, and Quality of life Patient reported stressors: Finances, Anxiety/ nervousness, and Adjusting to my illness Hopes and/or priorities: Pt's priority is to complete diagnostics and begin treatment w/ the hope of positive results. Patient enjoys time with  family/ friends Current coping skills/ strengths: Motivation for treatment/growth     SUMMARY: Current SDOH Barriers:  Financial constraints related to loss of income and Limited social support  Clinical Social Work Clinical Goal(s):  Explore community resource options for unmet needs related to:  Financial Strain   Interventions: Discussed common feeling and emotions when being diagnosed with cancer, and the importance of support during treatment Informed patient of the support team roles and support services at [facility] Provided CSW contact information and encouraged patient to call with any questions or concerns Encouraged pt to apply for Medicaid as he may now qualify with the loss of employment and the recent hospitalization.  Pt informed of the [employer] which he will apply for once treatment begins.   Follow Up Plan: Patient will contact CSW with any support or resource needs Patient verbalizes understanding of plan: Yes    Rachel Moulds, LCSW Clinical Social Worker [facility]

## 2023-04-12 ENCOUNTER — Other Ambulatory Visit: Payer: Self-pay

## 2023-04-12 ENCOUNTER — Telehealth: Payer: Self-pay | Admitting: Pharmacist

## 2023-04-12 ENCOUNTER — Other Ambulatory Visit (HOSPITAL_COMMUNITY): Payer: Self-pay

## 2023-04-12 ENCOUNTER — Encounter: Payer: Self-pay | Admitting: Hematology

## 2023-04-12 ENCOUNTER — Telehealth: Payer: Self-pay | Admitting: Pharmacy Technician

## 2023-04-12 DIAGNOSIS — C61 Malignant neoplasm of prostate: Secondary | ICD-10-CM

## 2023-04-12 MED ORDER — ABIRATERONE ACETATE 250 MG PO TABS
1000.0000 mg | ORAL_TABLET | Freq: Every day | ORAL | 0 refills | Status: DC
  Filled 2023-04-12: qty 120, 30d supply, fill #0

## 2023-04-12 MED ORDER — PREDNISONE 5 MG PO TABS
5.0000 mg | ORAL_TABLET | Freq: Every day | ORAL | 3 refills | Status: DC
Start: 2023-04-12 — End: 2023-04-27

## 2023-04-12 MED ORDER — PREDNISONE 5 MG PO TABS
5.0000 mg | ORAL_TABLET | Freq: Every day | ORAL | 3 refills | Status: DC
  Filled 2023-04-12: qty 30, 30d supply, fill #0

## 2023-04-12 MED ORDER — ABIRATERONE ACETATE 250 MG PO TABS
1000.0000 mg | ORAL_TABLET | Freq: Every day | ORAL | 0 refills | Status: DC
Start: 2023-04-12 — End: 2023-05-10

## 2023-04-12 NOTE — Telephone Encounter (Signed)
Oral Oncology Pharmacist Encounter   Patient's insurance requires them to fill through [facility]. Zytiga prescription has been redirected for dispensing. Prednisone has been redirected to patient's local [facility] pharmacy as this is where he fills his maintenance medications.    Oral Chemotherapy Clinic will follow up with outside pharmacy to ensure Rx is delivered.    Lenord Carbo, PharmD, BCPS, [facility] Hematology/Oncology Clinical Pharmacist [facility] and [facility] Oral Chemotherapy Navigation Clinics [phone] 04/12/2023 9:27 AM

## 2023-04-12 NOTE — Telephone Encounter (Signed)
Oral Oncology Patient Advocate Encounter  Prior Authorization for abiraterone has been approved.    PA# [id number] Effective dates: 04/12/23 through 04/11/24  [facility]    Jinger Neighbors, CPhT-Adv Oncology Pharmacy Patient Advocate [facility] Direct Number: [phone]  Fax: [phone]

## 2023-04-12 NOTE — Telephone Encounter (Signed)
Oral Oncology Patient Advocate Encounter   Received notification that prior authorization for abiraterone is required.   PA submitted on 04/12/23 Key [id number] Status is pending     Carlos Martin, CPhT-Adv Oncology Pharmacy Patient Advocate [facility] Direct Number: [phone]  Fax: [phone]

## 2023-04-12 NOTE — Telephone Encounter (Signed)
Oral Oncology Pharmacist Encounter  Received new prescription for Zytiga (abiraterone acetate) for the treatment of metastatic castration sensitive prostate cancer in conjunction with prednisone and ADT, planned duration until disease progression or unacceptable drug toxicity.  CBC w/ Diff and CMP from 04/08/23 assessed, patient with Scr of 1.95 mg/dL (CrCl ~16 mL/min) - no renal dose adjustments required. PSA from 04/08/23 44 ng/mL. Prescription dose and frequency assessed for appropriateness.  Plan for patient also to start Rivka Barbara - will counsel patient to start Vitamin D and calcium supplementation.  Current medication list in Epic reviewed, DDIs with Zytiga identified: Category C drug-drug interaction between Zytiga and Carvedilol - Zytiga, a CYP2D6 inhibitor may increase concentrations of carvedilol - recommend monitoring patient for hypotension, bradycardia. No changes in therapy required at this time.  Evaluated chart and no patient barriers to medication adherence noted.   Patient agreement for treatment documented in MD note on 04/08/23.  Prescription has been e-scribed to the [facility] for benefits analysis and approval.  Oral Oncology Clinic will continue to follow for insurance authorization, copayment issues, initial counseling and start date.  Lenord Carbo, PharmD, BCPS, [facility] Hematology/Oncology Clinical Pharmacist [facility] and [facility] Oral Chemotherapy Navigation Clinics [phone] 04/12/2023 8:50 AM

## 2023-04-14 DIAGNOSIS — N139 Obstructive and reflux uropathy, unspecified: Secondary | ICD-10-CM | POA: Diagnosis not present

## 2023-04-14 DIAGNOSIS — C7951 Secondary malignant neoplasm of bone: Secondary | ICD-10-CM | POA: Diagnosis not present

## 2023-04-14 DIAGNOSIS — I129 Hypertensive chronic kidney disease with stage 1 through stage 4 chronic kidney disease, or unspecified chronic kidney disease: Secondary | ICD-10-CM | POA: Diagnosis not present

## 2023-04-14 DIAGNOSIS — N179 Acute kidney failure, unspecified: Secondary | ICD-10-CM | POA: Diagnosis not present

## 2023-04-14 DIAGNOSIS — C61 Malignant neoplasm of prostate: Secondary | ICD-10-CM | POA: Diagnosis not present

## 2023-04-14 DIAGNOSIS — N189 Chronic kidney disease, unspecified: Secondary | ICD-10-CM | POA: Diagnosis not present

## 2023-04-14 NOTE — Telephone Encounter (Signed)
Oral Chemotherapy Pharmacist Encounter   Attempted to reach patient to provide update and offer for initial counseling on oral medication: Zytiga (abiraterone acetate).   No answer. Left voicemail for patient to call back to discuss details of medication acquisition and initial counseling session.  Lenord Carbo, PharmD, BCPS, [facility] Hematology/Oncology Clinical Pharmacist [facility] and [facility] Oral Chemotherapy Navigation Clinics [phone] 04/14/2023 1:46 PM

## 2023-04-14 NOTE — Progress Notes (Signed)
RN spoke with patient to inquire about getting him set up for ADT, Eligard, and Xgeva.   Patient voiced he wants to complete PSMA PET prior to starting Eligard and Xgeva.  RN provided education on hormonal therapy, will inform providers of patient's decision.  Pt was provided with contact information to set up shipment for Zytiga, and he will contact them for set up.  Pharmacy will follow with education.  Urology appointment pending, follow up request placed.

## 2023-04-15 NOTE — Telephone Encounter (Signed)
Oral Chemotherapy Pharmacist Encounter  I spoke with patient for overview of: Zytiga for the treatment of metastatic, castration-sensitive prostate cancer in conjunction with prednisone and ADT, planned duration until disease progression or unacceptable toxicity.   Counseled patient on administration, dosing, side effects, monitoring, drug-food interactions, safe handling, storage, and disposal.  Patient will take Zytiga 250mg  tablets, 4 tablets (1000mg ) by mouth once daily on an empty stomach, 1 hour before or 2 hours after a meal.  Patient states he will take his Zytiga in the morning and will wait at least 1 hour before eating.  Patient will take prednisone 5mg  tablet, 1 tablet by mouth one daily with breakfast.  Zytiga start date: 04/19/2023  Adverse effects include but are not limited to: peripheral edema, GI upset, hypertension, hot flashes, fatigue, and arthralgias.    Prednisone prescription has been sent to [facility] and patient has already picked up medication.. Patient will obtain prednisone and knows to start prednisone on the same day as Zytiga start.  Reviewed with patient importance of keeping a medication schedule and plan for any missed doses. No barriers to medication adherence identified.  Medication reconciliation performed and medication/allergy list updated.  Insurance authorization for Roosvelt Maser has been obtained. [facility] and will receive medication on 04/18/23.   Patient informed the pharmacy will reach out 5-7 days prior to needing next fill of Zytiga to coordinate continued medication acquisition to prevent break in therapy.  All questions answered.  Patient voiced understanding and appreciation.   Medication education handout placed in mail for patient. Patient knows to call the office with questions or concerns. Oral Chemotherapy Clinic phone number provided to patient.   Bethel Born,  PharmD Hematology/Oncology Clinical Pharmacist [facility] Oral Chemotherapy Navigation Clinic [phone] 04/15/2023 2:49 PM

## 2023-04-19 ENCOUNTER — Other Ambulatory Visit: Payer: Self-pay | Admitting: Urology

## 2023-04-19 DIAGNOSIS — R338 Other retention of urine: Secondary | ICD-10-CM | POA: Diagnosis not present

## 2023-04-19 DIAGNOSIS — D3500 Benign neoplasm of unspecified adrenal gland: Secondary | ICD-10-CM | POA: Diagnosis not present

## 2023-04-19 DIAGNOSIS — R8271 Bacteriuria: Secondary | ICD-10-CM | POA: Diagnosis not present

## 2023-04-19 DIAGNOSIS — N130 Hydronephrosis with ureteropelvic junction obstruction: Secondary | ICD-10-CM | POA: Diagnosis not present

## 2023-04-19 DIAGNOSIS — C61 Malignant neoplasm of prostate: Secondary | ICD-10-CM | POA: Diagnosis not present

## 2023-04-20 ENCOUNTER — Telehealth: Payer: Self-pay | Admitting: Hematology

## 2023-04-22 NOTE — Progress Notes (Signed)
RN spoke with patient to review that a financial advocate would be reaching out to him prior to his next follow up to review information needed for grant.  Pt verbalized understanding.  No additional needs at this time.

## 2023-04-25 ENCOUNTER — Other Ambulatory Visit: Payer: Self-pay | Admitting: Hematology

## 2023-04-25 ENCOUNTER — Ambulatory Visit: Payer: 59

## 2023-04-25 DIAGNOSIS — C61 Malignant neoplasm of prostate: Secondary | ICD-10-CM

## 2023-04-27 ENCOUNTER — Ambulatory Visit (INDEPENDENT_AMBULATORY_CARE_PROVIDER_SITE_OTHER): Payer: 59 | Admitting: Student

## 2023-04-27 VITALS — BP 124/64 | HR 63 | Temp 98.2°F | Ht 68.0 in | Wt 188.8 lb

## 2023-04-27 DIAGNOSIS — M109 Gout, unspecified: Secondary | ICD-10-CM

## 2023-04-27 DIAGNOSIS — C7951 Secondary malignant neoplasm of bone: Secondary | ICD-10-CM

## 2023-04-27 DIAGNOSIS — I10 Essential (primary) hypertension: Secondary | ICD-10-CM | POA: Diagnosis not present

## 2023-04-27 DIAGNOSIS — C61 Malignant neoplasm of prostate: Secondary | ICD-10-CM

## 2023-04-27 DIAGNOSIS — R739 Hyperglycemia, unspecified: Secondary | ICD-10-CM

## 2023-04-27 NOTE — Progress Notes (Addendum)
COVID Vaccine received:  []  No [x]  Yes Date of any COVID positive Test in last 90 days: no PCP - Dr. [name] Cardiologist - Dr. Sharyn Lull  Chest x-ray -  EKG -  03/24/23 EPIC Stress Test -03/19/11 EPIC  ECHO -  Cardiac Cath -   Bowel Prep - [x]  No  []   Yes ______  Pacemaker / ICD device [x]  No []  Yes   Spinal Cord Stimulator:[x]  No []  Yes       History of Sleep Apnea? [x]  No []  Yes   CPAP used?- [x]  No []  Yes    Does the patient monitor blood sugar?          [x]  No []  Yes  []  N/A  Patient has: []  NO Hx DM   []  Pre-DM                 []  DM1  [x]   DM2 Does patient have a [employer] or Dexacom? [x]  No []  Yes   Fasting Blood Sugar Ranges-  Checks Blood Sugar __0__ times a day  GLP1 agonist / usual dose - No GLP1 instructions:  SGLT-2 inhibitors / usual dose -No  Blood Thinner / Instructions:No Aspirin Instructions:No  Comments:   Activity level: Patient is able to climb a flight of stairs without difficulty; [x]  No CP  [x]  No SOB, but would have ___   Patient can / perform ADLs without assistance.   Anesthesia review: HTN, DM, Prostate CA w/ Mets to bone, Hgb 9.0 05/02/23  Patient denies shortness of breath, fever, cough and chest pain at PAT appointment.  Patient verbalized understanding and agreement to the Pre-Surgical Instructions that were given to them at this PAT appointment. Patient was also educated of the need to review these PAT instructions again prior to his/her surgery.I reviewed the appropriate phone numbers to call if they have any and questions or concerns.

## 2023-04-27 NOTE — Patient Instructions (Addendum)
SURGICAL WAITING ROOM VISITATION  Patients having surgery or a procedure may have no more than 2 support people in the waiting area - these visitors may rotate.    Children under the age of 40 must have an adult with them who is not the patient.  Due to an increase in RSV and influenza rates and associated hospitalizations, children ages 72 and under may not visit patients in [facility] hospitals.  If the patient needs to stay at the hospital during part of their recovery, the visitor guidelines for inpatient rooms apply. Pre-op nurse will coordinate an appropriate time for 1 support person to accompany patient in pre-op.  This support person may not rotate.    Please refer to the [facility] website for the visitor guidelines for Inpatients (after your surgery is over and you are in a regular room).       Your procedure is scheduled on: 05/12/23   Report to [facility] Main Entrance    Report to admitting at 7:30 AM   Call this number if you have problems the morning of surgery [phone]   Do not eat food or drink liquids :After Midnight.    Oral Hygiene is also important to reduce your risk of infection.                                    Remember - BRUSH YOUR TEETH THE MORNING OF SURGERY WITH YOUR REGULAR TOOTHPASTE   Take these medicines the morning of surgery with A SIP OF WATER: Tylenol, Atorvastatin, Carvedilol, Zytiga, Prednisone  DO NOT TAKE ANY ORAL DIABETIC MEDICATIONS DAY OF YOUR SURGERY             You may not have any metal on your body including hair pins, jewelry, and body piercing             Do not wear make-up, lotions, powders, cologne, or deodorant               Men may shave face and neck.   Do not bring valuables to the hospital. [facility] IS NOT             RESPONSIBLE   FOR VALUABLES.   Contacts, glasses, dentures or bridgework may not be worn into surgery.   Bring small overnight bag day of surgery.   DO NOT BRING YOUR HOME  MEDICATIONS TO THE HOSPITAL. PHARMACY WILL DISPENSE MEDICATIONS LISTED ON YOUR MEDICATION LIST TO YOU DURING YOUR ADMISSION IN THE HOSPITAL!    Patients discharged on the day of surgery will not be allowed to drive home.  Someone NEEDS to stay with you for the first 24 hours after anesthesia.   Special Instructions: Bring a copy of your healthcare power of attorney and living will documents the day of surgery if you haven't scanned them before.              Please read over the following fact sheets you were given: IF YOU HAVE QUESTIONS ABOUT YOUR PRE-OP INSTRUCTIONS PLEASE CALL [phone] Rosey [facility]   If you received a COVID test during your pre-op visit  it is requested that you wear a mask when out in public, stay away from anyone that may not be feeling well and notify your surgeon if you develop symptoms. If you test positive for Covid or have been in contact with anyone that has tested positive in the last  10 days please notify you surgeon.    [place] - Preparing for Surgery Before surgery, you can play an important role.  Because skin is not sterile, your skin needs to be as free of germs as possible.  You can reduce the number of germs on your skin by washing with CHG (chlorahexidine gluconate) soap before surgery.  CHG is an antiseptic cleaner which kills germs and bonds with the skin to continue killing germs even after washing. Please DO NOT use if you have an allergy to CHG or antibacterial soaps.  If your skin becomes reddened/irritated stop using the CHG and inform your nurse when you arrive at Short Stay. Do not shave (including legs and underarms) for at least 48 hours prior to the first CHG shower.  You may shave your face/neck.  Please follow these instructions carefully:  1.  Shower with CHG Soap the night before surgery and the  morning of surgery.  2.  If you choose to wash your hair, wash your hair first as usual with your normal  shampoo.  3.  After you shampoo, rinse  your hair and body thoroughly to remove the shampoo.                             4.  Use CHG as you would any other liquid soap.  You can apply chg directly to the skin and wash.  Gently with a scrungie or clean washcloth.  5.  Apply the CHG Soap to your body ONLY FROM THE NECK DOWN.   Do   not use on face/ open                           Wound or open sores. Avoid contact with eyes, ears mouth and   genitals (private parts).                       Wash face,  Genitals (private parts) with your normal soap.             6.  Wash thoroughly, paying special attention to the area where your    surgery  will be performed.  7.  Thoroughly rinse your body with warm water from the neck down.  8.  DO NOT shower/wash with your normal soap after using and rinsing off the CHG Soap.                9.  Pat yourself dry with a clean towel.            10.  Wear clean pajamas.            11.  Place clean sheets on your bed the night of your first shower and do not  sleep with pets. Day of Surgery : Do not apply any lotions/deodorants the morning of surgery.  Please wear clean clothes to the hospital/surgery center.  FAILURE TO FOLLOW THESE INSTRUCTIONS MAY RESULT IN THE CANCELLATION OF YOUR SURGERY  _How to Manage Your Diabetes Before and After Surgery  Why is it important to control my blood sugar before and after surgery? Improving blood sugar levels before and after surgery helps healing and can limit problems. A way of improving blood sugar control is eating a healthy diet by:  Eating less sugar and carbohydrates  Increasing activity/exercise  Talking with your doctor about reaching your blood sugar goals  High blood sugars (greater than 180 mg/dL) can raise your risk of infections and slow your recovery, so you will need to focus on controlling your diabetes during the weeks before surgery. Make sure that the doctor who takes care of your diabetes knows about your planned surgery including the date and  location.  How do I manage my blood sugar before surgery? Check your blood sugar at least 4 times a day, starting 2 days before surgery, to make sure that the level is not too high or low. Check your blood sugar the morning of your surgery when you wake up and every 2 hours until you get to the Short Stay unit. If your blood sugar is less than 70 mg/dL, you will need to treat for low blood sugar: Do not take insulin. Treat a low blood sugar (less than 70 mg/dL) with  cup of clear juice (cranberry or apple), 4 glucose tablets, OR glucose gel. Recheck blood sugar in 15 minutes after treatment (to make sure it is greater than 70 mg/dL). If your blood sugar is not greater than 70 mg/dL on recheck, call [phone] for further instructions. Report your blood sugar to the short stay nurse when you get to Short Stay.  If you are admitted to the hospital after surgery: Your blood sugar will be checked by the staff and you will probably be given insulin after surgery (instead of oral diabetes medicines) to make sure you have good blood sugar levels. The goal for blood sugar control after surgery is 80-180 mg/dL.   WHAT DO I DO ABOUT MY DIABETES MEDICATION?  Do not take oral diabetes medicines (pills) the morning of surgery.     Patient Signature:  Date:   Nurse Signature:  Date:   Reviewed and Endorsed by [facility] Patient Education Committee, August 2015

## 2023-04-27 NOTE — Progress Notes (Signed)
CC: Check up and work excuse  HPI:  Carlos Martin is a 67 y.o. male living with a history stated below and presents today for Check up. He was recently diagnosed with metastatic prostate cancer and has TURP planned for later July. Please see problem based assessment and plan for additional details.  Past Medical History:  Diagnosis Date   Hypertension     Current Outpatient Medications on File Prior to Visit  Medication Sig Dispense Refill   abiraterone acetate (ZYTIGA) 250 MG tablet Take 4 tablets (1,000 mg total) by mouth daily. Take on an empty stomach 1 hour before or 2 hours after a meal 120 tablet 0   acetaminophen (TYLENOL) 500 MG tablet Take 1,000 mg by mouth 2 (two) times daily as needed for moderate pain, headache or fever.     atorvastatin (LIPITOR) 40 MG tablet Take 40 mg by mouth daily.     bisacodyl (DULCOLAX) 5 MG EC tablet Take 2 tablets (10 mg total) by mouth daily as needed for moderate constipation. 30 tablet 0   carvedilol (COREG) 25 MG tablet Take 25 mg by mouth daily.     latanoprost (XALATAN) 0.005 % ophthalmic solution Place 1 drop into both eyes at bedtime. 2.5 mL 1   senna-docusate (SENOKOT-S) 8.6-50 MG tablet Take 1 tablet by mouth 2 (two) times daily. 60 tablet 0   No current facility-administered medications on file prior to visit.    No family history on file.  Social History   Socioeconomic History   Marital status: Married    Spouse name: Not on file   Number of children: 4   Years of education: Not on file   Highest education level: Not on file  Occupational History   Not on file  Tobacco Use   Smoking status: Never   Smokeless tobacco: Not on file  Substance and Sexual Activity   Alcohol use: No   Drug use: No   Sexual activity: Not on file  Other Topics Concern   Not on file  Social History Narrative   Not on file   Social Determinants of Health   Financial Resource Strain: High Risk (04/11/2023)   Overall Financial Resource  Strain (CARDIA)    Difficulty of Paying Living Expenses: Very hard  Food Insecurity: No Food Insecurity (03/23/2023)   Hunger Vital Sign    Worried About Running Out of Food in the Last Year: Never true    Ran Out of Food in the Last Year: Never true  Transportation Needs: No Transportation Needs (03/23/2023)   PRAPARE - Administrator, Civil Service (Medical): No    Lack of Transportation (Non-Medical): No  Physical Activity: Not on file  Stress: Not on file  Social Connections: Not on file  Intimate Partner Violence: Not At Risk (03/23/2023)   Humiliation, Afraid, Rape, and Kick questionnaire    Fear of Current or Ex-Partner: No    Emotionally Abused: No    Physically Abused: No    Sexually Abused: No    Review of Systems: ROS negative except for what is noted on the assessment and plan.  Vitals:   04/27/23 1108  BP: 124/64  Pulse: 63  Temp: 98.2 F (36.8 C)  TempSrc: Oral  SpO2: 100%  Weight: 188 lb 12.8 oz (85.6 kg)  Height: 5\' 8"  (1.727 m)    Physical Exam: Constitutional: well-appearing male sitting in chair, in no acute distress HENT: normocephalic atraumatic, mucous membranes moist Eyes: conjunctiva non-erythematous Cardiovascular: regular  rate and rhythm, no m/r/g Pulmonary/Chest: normal work of breathing on room air, lungs clear to auscultation bilaterally Abdominal: soft, non-tender, non-distended GU: Foley catheter in place with clear non-bloody urine MSK: normal bulk and tone Neurological: alert & oriented x 3, no focal deficit Skin: warm and dry Psych: normal mood and behavior  Assessment & Plan:    Patient seen with Dr. Mayford Knife  Prostate cancer metastatic to bone [facility]) Metastatic disease discovered last month when he was hospitalized for a UTI. He is in touch with urology and oncology and has a TURP planned for late July. Foley in place at the moment that looks good. He is without complaint today and in good spirits. He anticipates rehab and  recover from his treatment would like to discuss a work excuse, which I signed today.  Essential hypertension, benign Currently well controlled, amlodipine was dropped during hospitalization, no need to renew today.  Hyperglycemia Patient was previously diagnosed with diabetes with recent A1C 6.8. He took metformin in the past but was stopped when he was hospitalized. PLAN: Will follow this issue over time, but will not restart metformin today given his upcoming cancer treatment.  Katheran James, D.O. [facility] Internal Medicine, PGY-1 Phone: [phone] Date 04/27/2023 Time 2:01 PM

## 2023-04-27 NOTE — Assessment & Plan Note (Addendum)
Metastatic disease discovered last month when he was hospitalized for a UTI. He is in touch with urology and oncology and has a TURP planned for late July. Foley in place at the moment that looks good. He is without complaint today and in good spirits. He anticipates rehab and recover from his treatment would like to discuss a work excuse, which I signed today.

## 2023-04-27 NOTE — Assessment & Plan Note (Signed)
Patient was previously diagnosed with diabetes with recent A1C 6.8. He took metformin in the past but was stopped when he was hospitalized. PLAN: Will follow this issue over time, but will not restart metformin today given his upcoming cancer treatment.

## 2023-04-27 NOTE — Patient Instructions (Signed)
Carlos Martin,  Thank you for coming in today. This after visit summary is an important review of tests, referrals, and medication changes that were discussed during your visit. If you have questions or concerns, call the clinic at [phone] between 9am - 4pm. Outside of clinic business hours, call the main hospital at [phone] and ask the operator for the on-call internal medicine resident.  We wish you all the best with your upcoming procedure. Reach out if there is anything we can do.   Best, Dr. Katheran James

## 2023-04-27 NOTE — Assessment & Plan Note (Signed)
Currently well controlled, amlodipine was dropped during hospitalization, no need to renew today.

## 2023-04-28 NOTE — Progress Notes (Signed)
Internal Medicine Clinic Attending  I was physically present during the key portions of the resident provided service and participated in the medical decision making of patient's management care. I reviewed pertinent patient test results.  The assessment, diagnosis, and plan were formulated together and I agree with the documentation in the resident's note, including waiting on initiating metformin (no rush) and no need to restart his amlodipine at this time.  Miguel Aschoff, MD

## 2023-05-02 ENCOUNTER — Encounter (HOSPITAL_COMMUNITY)
Admission: RE | Admit: 2023-05-02 | Discharge: 2023-05-02 | Disposition: A | Discharge status: Home / Self Care | Payer: 59 | Source: Ambulatory Visit | Attending: Urology | Admitting: Urology

## 2023-05-02 ENCOUNTER — Other Ambulatory Visit: Payer: Self-pay

## 2023-05-02 ENCOUNTER — Encounter (HOSPITAL_COMMUNITY): Payer: Self-pay

## 2023-05-02 ENCOUNTER — Ambulatory Visit (HOSPITAL_COMMUNITY)
Admission: RE | Admit: 2023-05-02 | Discharge: 2023-05-02 | Disposition: A | Discharge status: Home / Self Care | Payer: 59 | Source: Ambulatory Visit | Attending: Hematology | Admitting: Hematology

## 2023-05-02 VITALS — BP 140/71 | HR 62 | Temp 98.7°F | Resp 16 | Ht 68.0 in | Wt 188.0 lb

## 2023-05-02 DIAGNOSIS — C7951 Secondary malignant neoplasm of bone: Secondary | ICD-10-CM | POA: Diagnosis not present

## 2023-05-02 DIAGNOSIS — I10 Essential (primary) hypertension: Secondary | ICD-10-CM | POA: Insufficient documentation

## 2023-05-02 DIAGNOSIS — C61 Malignant neoplasm of prostate: Secondary | ICD-10-CM | POA: Insufficient documentation

## 2023-05-02 DIAGNOSIS — E119 Type 2 diabetes mellitus without complications: Secondary | ICD-10-CM

## 2023-05-02 DIAGNOSIS — Z01818 Encounter for other preprocedural examination: Secondary | ICD-10-CM | POA: Insufficient documentation

## 2023-05-02 HISTORY — DX: Malignant (primary) neoplasm, unspecified: C80.1

## 2023-05-02 LAB — CBC
HCT: 29 % — ABNORMAL LOW (ref 39.0–52.0)
Hemoglobin: 9 g/dL — ABNORMAL LOW (ref 13.0–17.0)
MCH: 23.5 pg — ABNORMAL LOW (ref 26.0–34.0)
MCHC: 31 g/dL (ref 30.0–36.0)
MCV: 75.7 fL — ABNORMAL LOW (ref 80.0–100.0)
Platelets: 141 10*3/uL — ABNORMAL LOW (ref 150–400)
RBC: 3.83 MIL/uL — ABNORMAL LOW (ref 4.22–5.81)
RDW: 18.1 % — ABNORMAL HIGH (ref 11.5–15.5)
WBC: 6.6 10*3/uL (ref 4.0–10.5)
nRBC: 0 % (ref 0.0–0.2)

## 2023-05-02 LAB — BASIC METABOLIC PANEL
Anion gap: 8 (ref 5–15)
BUN: 29 mg/dL — ABNORMAL HIGH (ref 8–23)
CO2: 22 mmol/L (ref 22–32)
Calcium: 9.2 mg/dL (ref 8.9–10.3)
Chloride: 106 mmol/L (ref 98–111)
Creatinine, Ser: 1.5 mg/dL — ABNORMAL HIGH (ref 0.61–1.24)
GFR, Estimated: 52 mL/min — ABNORMAL LOW (ref >60)
Glucose, Bld: 159 mg/dL — ABNORMAL HIGH (ref 70–99)
Potassium: 4.6 mmol/L (ref 3.5–5.1)
Sodium: 136 mmol/L (ref 135–145)

## 2023-05-02 MED ORDER — PIFLIFOLASTAT F 18 (PYLARIFY) INJECTION
9.0000 | Freq: Once | INTRAVENOUS | Status: AC
  Administered 2023-05-02: 8.151 via INTRAVENOUS

## 2023-05-02 NOTE — Progress Notes (Signed)
Please review pt's preop CBC drawn 05/02/23

## 2023-05-06 ENCOUNTER — Inpatient Hospital Stay: Payer: 59 | Attending: Hematology

## 2023-05-06 ENCOUNTER — Encounter: Payer: Self-pay | Admitting: Hematology

## 2023-05-06 ENCOUNTER — Inpatient Hospital Stay: Payer: 59

## 2023-05-06 ENCOUNTER — Inpatient Hospital Stay (HOSPITAL_BASED_OUTPATIENT_CLINIC_OR_DEPARTMENT_OTHER): Payer: 59 | Admitting: Hematology

## 2023-05-06 ENCOUNTER — Other Ambulatory Visit: Payer: Self-pay

## 2023-05-06 VITALS — BP 128/63 | HR 70 | Temp 99.1°F | Resp 18 | Wt 196.3 lb

## 2023-05-06 DIAGNOSIS — C61 Malignant neoplasm of prostate: Secondary | ICD-10-CM | POA: Diagnosis not present

## 2023-05-06 DIAGNOSIS — C775 Secondary and unspecified malignant neoplasm of intrapelvic lymph nodes: Secondary | ICD-10-CM | POA: Diagnosis not present

## 2023-05-06 DIAGNOSIS — Z79899 Other long term (current) drug therapy: Secondary | ICD-10-CM | POA: Diagnosis not present

## 2023-05-06 DIAGNOSIS — Z5111 Encounter for antineoplastic chemotherapy: Secondary | ICD-10-CM | POA: Insufficient documentation

## 2023-05-06 DIAGNOSIS — D509 Iron deficiency anemia, unspecified: Secondary | ICD-10-CM

## 2023-05-06 DIAGNOSIS — C7951 Secondary malignant neoplasm of bone: Secondary | ICD-10-CM | POA: Insufficient documentation

## 2023-05-06 LAB — COMPREHENSIVE METABOLIC PANEL
ALT: 11 U/L (ref 0–44)
AST: 13 U/L — ABNORMAL LOW (ref 15–41)
Albumin: 4.1 g/dL (ref 3.5–5.0)
Alkaline Phosphatase: 162 U/L — ABNORMAL HIGH (ref 38–126)
Anion gap: 8 (ref 5–15)
BUN: 29 mg/dL — ABNORMAL HIGH (ref 8–23)
CO2: 26 mmol/L (ref 22–32)
Calcium: 9.3 mg/dL (ref 8.9–10.3)
Chloride: 101 mmol/L (ref 98–111)
Creatinine, Ser: 1.65 mg/dL — ABNORMAL HIGH (ref 0.61–1.24)
GFR, Estimated: 46 mL/min — ABNORMAL LOW (ref >60)
Glucose, Bld: 239 mg/dL — ABNORMAL HIGH (ref 70–99)
Potassium: 4.6 mmol/L (ref 3.5–5.1)
Sodium: 135 mmol/L (ref 135–145)
Total Bilirubin: 0.4 mg/dL (ref 0.3–1.2)
Total Protein: 7.7 g/dL (ref 6.5–8.1)

## 2023-05-06 LAB — CBC WITH DIFFERENTIAL/PLATELET
Abs Immature Granulocytes: 0.04 10*3/uL (ref 0.00–0.07)
Basophils Absolute: 0 10*3/uL (ref 0.0–0.1)
Basophils Relative: 0 %
Eosinophils Absolute: 0.2 10*3/uL (ref 0.0–0.5)
Eosinophils Relative: 2 %
HCT: 27.6 % — ABNORMAL LOW (ref 39.0–52.0)
Hemoglobin: 9.2 g/dL — ABNORMAL LOW (ref 13.0–17.0)
Immature Granulocytes: 1 %
Lymphocytes Relative: 21 %
Lymphs Abs: 1.6 10*3/uL (ref 0.7–4.0)
MCH: 24.3 pg — ABNORMAL LOW (ref 26.0–34.0)
MCHC: 33.3 g/dL (ref 30.0–36.0)
MCV: 73 fL — ABNORMAL LOW (ref 80.0–100.0)
Monocytes Absolute: 0.5 10*3/uL (ref 0.1–1.0)
Monocytes Relative: 7 %
Neutro Abs: 5.4 10*3/uL (ref 1.7–7.7)
Neutrophils Relative %: 69 %
Platelets: 139 10*3/uL — ABNORMAL LOW (ref 150–400)
RBC: 3.78 MIL/uL — ABNORMAL LOW (ref 4.22–5.81)
RDW: 17.8 % — ABNORMAL HIGH (ref 11.5–15.5)
WBC: 7.7 10*3/uL (ref 4.0–10.5)
nRBC: 0 % (ref 0.0–0.2)

## 2023-05-06 LAB — FERRITIN: Ferritin: 400 ng/mL — ABNORMAL HIGH (ref 24–336)

## 2023-05-06 MED ORDER — LEUPROLIDE ACETATE (6 MONTH) 45 MG ~~LOC~~ KIT
45.0000 mg | PACK | Freq: Once | SUBCUTANEOUS | Status: AC
  Administered 2023-05-06: 45 mg via SUBCUTANEOUS
  Filled 2023-05-06: qty 45

## 2023-05-06 NOTE — Progress Notes (Signed)
[facility]   [phone] [phone]   Clinic Follow up Note   Patient Care Team: Default, Provider, MD as PCP - General Cherlyn Cushing, RN as Oncology Nurse Navigator  Date of Service:  05/06/2023  CHIEF COMPLAINT: f/u of Prostate Cancer   CURRENT THERAPY:  Leuprolide (Eligard) q6 month Oral Zytiga w/ low prednisone ASSESSMENT:   Carlos Martin is a 67 y.o. male with    1. 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 Bone scan and spinal MRI showed diffuse bone mets, PSA 524. Bone biopsy confirmed metastatic prostate cancer.  -I reviewed the incurable nature of his disease and treatment options and need for long treatment -he started ADT in hospital and his back pain has much improved, will continue ADT and change to Eligard on 7/8. He previouly had reservation bout the therapy, after detailed discussion he agreed to start on 7/19.  -I personally reviewed his PSMA scan with him and his wife today which showed multiple bone mets and pelvic node metastasis  -will review his case in GU tumor board to see if he would benefit from RT  -he has started systemic therapy with Zytiga and prednisone in June 2024, he is tolerating well overall.  Will continue. -Will monitor his PSA closely -We again reviewed the incurable nature of his metastatic disease, and the need for long-term treatment.  He voiced good understanding and agrees with the plan.   2. Diffuse bone mets -his pain has improved with prostate cancer treatment and pain meds  -I recommend Xgeva injection, will start in next month. If has still has renal insufficiency, then will give every 2-3 months   -He has not had a dental care for many years, and does not plan to see dentistry.  I will hold on his Xgeva for now.  3.  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 Laverle Patter, likely Foley will be removed in the near future -I encouraged him to hydrate well -Avoid NSAIDs and avoid nephrotoxic medication and IV contrast   PLAN: -reviewed PET scan w/ pt -pt prefer q6 months Eligard injection, will start today  -discuss Radiation Therapy -Discuss Xgeva injection, but will revisit the discussion -lab reviewed -proceed with injection today -I recommend Vitamin D -lab and f/u in 2 months  SUMMARY OF ONCOLOGIC HISTORY: Oncology History Overview Note   Cancer Staging  Prostate cancer metastatic to bone [facility]) Staging form: Prostate, AJCC 8th Edition - Clinical stage from 03/28/2023: Stage IVB (cTX, cN0, pM1, PSA: 524) - Signed by [name], MD on 04/08/2023 Prostate specific antigen (PSA) range: 20 or greater     Prostate cancer metastatic to bone (HCC)  03/24/2023 Imaging   NM BONE SCAN WHOLE BODY   IMPRESSION: Multifocal osseous metastatic disease. Areas include numerous bilateral ribs, sternum, scapula, spine, pelvis and proximal femurs     03/28/2023 Cancer Staging   Staging form: Prostate, AJCC 8th Edition - Clinical stage from 03/28/2023: Stage IVB (cTX, cN0, pM1, PSA: 524) - Signed by [name], MD on 04/08/2023 Prostate specific antigen (PSA) range: 20 or greater   03/28/2023 Pathology Results     FINAL MICROSCOPIC DIAGNOSIS:  A. RIGHT ILIAC, BONE BIOPSY: Metastatic poorly differentiated prostatic adenocarcinoma  COMMENT:  Sections show fragmented reactive and sclerotic trabecular bone with associated desmoplastic and fibrotic stroma with abundant associated blood clot.  Within  the background there are fragments of apparent necrotic tumor additionally there are loosely cohesive nests of atypical cells with a marked nuclear cytoplasmic ratio and an enlarged round to oval irregular hyperchromatic nuclei. Four immunohistochemical stains performed with adequate control to elucidate the histogenesis of this tumor.  The cells  are focally and weakly positive for pancytokeratin (AE1/AE3).  The cells are positive for the prostate marker prostein and show focal weak patchy positivity for prostate-specific antigen.  The cells are negative for the neuroendocrine marker synaptophysin.  This immunohistochemical profile supports the above diagnosis.    04/08/2023 Initial Diagnosis   Prostate cancer metastatic to bone [facility])      INTERVAL HISTORY:  Carlos Martin is here for a follow up of Prostate Cancer. He was last seen by me on 04/08/2023. He presents to the clinic accompanied by wife. Pt state that he will have his cath remove next week. Pt denies having any pain after receiving injection.     All other systems were reviewed with the patient and are negative.  MEDICAL HISTORY:  Past Medical History:  Diagnosis Date   Cancer [facility])    Hypertension     SURGICAL HISTORY: Past Surgical History:  Procedure Laterality Date   COLONOSCOPY  2024    I have reviewed the social history and family history with the patient and they are unchanged from previous note.  ALLERGIES:  has No Known Allergies.  MEDICATIONS:  Current Outpatient Medications  Medication Sig Dispense Refill   abiraterone acetate (ZYTIGA) 250 MG tablet Take 4 tablets (1,000 mg total) by mouth daily. Take on an empty stomach 1 hour before or 2 hours after a meal 120 tablet 0   amLODipine (NORVASC) 5 MG tablet Take 5 mg by mouth daily.     atorvastatin (LIPITOR) 40 MG tablet Take 40 mg by mouth daily.     bisacodyl (DULCOLAX) 5 MG EC tablet Take 2 tablets (10 mg total) by mouth daily as needed for moderate constipation. 30 tablet 0   carvedilol (COREG) 25 MG tablet Take 25 mg by mouth daily.     latanoprost (XALATAN) 0.005 % ophthalmic solution Place 1 drop into both eyes at bedtime. 2.5 mL 1   Multiple Vitamin (MULTIVITAMIN WITH MINERALS) TABS tablet Take 1 tablet by mouth daily.     multivitamin-lutein (OCUVITE-LUTEIN) CAPS capsule Take 1  capsule by mouth daily.     predniSONE (DELTASONE) 5 MG tablet Take 5 mg by mouth daily with breakfast.     No current facility-administered medications for this visit.    PHYSICAL EXAMINATION: ECOG PERFORMANCE STATUS: 1 - Symptomatic but completely ambulatory  Vitals:   05/06/23 1408  BP: 128/63  Pulse: 70  Resp: 18  Temp: 99.1 F (37.3 C)  SpO2: 100%   Wt Readings from Last 3 Encounters:  05/06/23 196 lb 4.8 oz (89 kg)  05/02/23 188 lb (85.3 kg)  04/27/23 188 lb 12.8 oz (85.6 kg)     GENERAL:alert, no distress and comfortable SKIN: skin color normal, no rashes or significant lesions EYES: normal, Conjunctiva are pink and non-injected, sclera clear  NEURO: alert & oriented x 3 with fluent speech   LABORATORY DATA:  I have reviewed the data as listed    Latest Ref Rng & Units 05/06/2023    1:45 PM 05/02/2023    1:34 PM 04/08/2023   12:31 PM  CBC  WBC 4.0 - 10.5 K/uL 7.7  6.6  11.5   Hemoglobin 13.0 - 17.0 g/dL 9.2  9.0  9.2   Hematocrit 39.0 - 52.0 % 27.6  29.0  27.8   Platelets 150 - 400 K/uL 139  141  315         Latest Ref Rng & Units 05/06/2023    1:45 PM 05/02/2023    1:34 PM 04/08/2023   12:31 PM  CMP  Glucose 70 - 99 mg/dL 161  096  88   BUN 8 - 23 mg/dL 29  29  39   Creatinine 0.61 - 1.24 mg/dL 0.45  4.09  8.11   Sodium 135 - 145 mmol/L 135  136  135   Potassium 3.5 - 5.1 mmol/L 4.6  4.6  4.6   Chloride 98 - 111 mmol/L 101  106  105   CO2 22 - 32 mmol/L 26  22  21    Calcium 8.9 - 10.3 mg/dL 9.3  9.2  9.7   Total Protein 6.5 - 8.1 g/dL 7.7   7.9   Total Bilirubin 0.3 - 1.2 mg/dL 0.4   0.3   Alkaline Phos 38 - 126 U/L 162   106   AST 15 - 41 U/L 13   18   ALT 0 - 44 U/L 11   22       RADIOGRAPHIC STUDIES: I have personally reviewed the radiological images as listed and agreed with the findings in the report. No results found.    No orders of the defined types were placed in this encounter.  All questions were answered. The patient knows to  call the clinic with any problems, questions or concerns. No barriers to learning was detected. The total time spent in the appointment was 30 minutes.     [name], MD 05/06/2023   Carolin Coy, CMA, am acting as scribe for [name], MD.   I have reviewed the above documentation for accuracy and completeness, and I agree with the above.

## 2023-05-07 LAB — PROSTATE-SPECIFIC AG, SERUM (LABCORP): Prostate Specific Ag, Serum: 1.2 ng/mL (ref 0.0–4.0)

## 2023-05-08 ENCOUNTER — Other Ambulatory Visit: Payer: Self-pay | Admitting: Hematology

## 2023-05-08 DIAGNOSIS — C61 Malignant neoplasm of prostate: Secondary | ICD-10-CM

## 2023-05-09 ENCOUNTER — Telehealth: Payer: Self-pay | Admitting: Hematology

## 2023-05-09 ENCOUNTER — Encounter: Payer: Self-pay | Admitting: Hematology

## 2023-05-09 NOTE — Progress Notes (Addendum)
Patient added to next GU/Prostate Conference on 8/9 to review recent PSMA PET imaging.  Additional request placed for financial advocates to reach out to patient to review resources.

## 2023-05-09 NOTE — Progress Notes (Signed)
Called patient referred by RN regarding [employer]. Introduced myself as Dance movement psychotherapist and to offer available resources. Discussed one-time $1000 [employer] grant to assist with personal expenses while going through treatment and $200 Prostate grant( for medication and gas only). Advised what is needed to apply and how to submit. Discussed next steps in process to get paperwork complete via email and docusign or deliver and sign paperwork in person. Sent email confirming these steps as well to email provided as [email or web address].  Provided my direct contact name and number to do so and for any additional financial questions or concerns.

## 2023-05-10 ENCOUNTER — Other Ambulatory Visit: Payer: Self-pay

## 2023-05-10 DIAGNOSIS — C61 Malignant neoplasm of prostate: Secondary | ICD-10-CM

## 2023-05-10 MED ORDER — ABIRATERONE ACETATE 250 MG PO TABS
1000.0000 mg | ORAL_TABLET | Freq: Every day | ORAL | 0 refills | Status: DC
Start: 2023-05-10 — End: 2023-06-07

## 2023-05-11 NOTE — H&P (Signed)
Office Visit Report     04/19/2023   --------------------------------------------------------------------------------   Carlos Martin  MRN: [id number]  DOB: 22-Sep-1956, 67 year old Male  SSN:    PRIMARY CARE:     REFERRING:    PROVIDER:  Heloise Purpura, M.D.  LOCATION:  [facility] [phone]     --------------------------------------------------------------------------------   CC/HPI: 1. Metastatic prostate cancer  2. Urinary retention   Carlos Martin is a 67 year old gentleman from [place] who recently presented to the hospital with urinary retention and an acute kidney injury along with bilateral hydronephrosis. He was subsequently found to have widespread metastatic prostate cancer and began androgen deprivation therapy in the hospital with degarelix. He has since followed up with Dr. Mosetta Putt and is being evaluated for ongoing systemic therapy. However, he apparently did decline ongoing androgen deprivation therapy more recently. He apparently has follow-up in oncology on July 15. With regard to his urinary retention, his renal function did improve but rather slowly. He follows up today with a repeat renal ultrasound to evaluate his hydronephrosis.     ALLERGIES: No Allergies    MEDICATIONS: Abiraterone Acetate 250 mg tablet 1 tablet PO Daily  Amlodipine Besylate 10 mg tablet 1 tablet PO Daily  Atorvastatin Calcium 40 mg tablet 1 tablet PO Daily  Cod Liver Oil  Latanoprost 0.005 % drops 1 PO Daily  Senna-Docusate Sodium     GU PSH: No GU PSH    NON-GU PSH: No Non-GU PSH    GU PMH: Prostate Cancer    NON-GU PMH: Gout    FAMILY HISTORY: No Family History    SOCIAL HISTORY: Marital Status: Married Ethnicity: Not Hispanic Or Latino; Race: Black or African American Current Smoking Status: Patient has never smoked.   Tobacco Use Assessment Completed: Used Tobacco in last 30 days? Has never drank.  Drinks 1 caffeinated drink per day.    REVIEW OF  SYSTEMS:    GU Review Male:   Patient denies frequent urination, hard to postpone urination, burning/ pain with urination, get up at night to urinate, leakage of urine, stream starts and stops, trouble starting your streams, and have to strain to urinate .  Gastrointestinal (Lower):   Patient denies diarrhea and constipation.  Gastrointestinal (Upper):   Patient denies vomiting and nausea.  Constitutional:   Patient denies fever, night sweats, weight loss, and fatigue.  Skin:   Patient denies skin rash/ lesion and itching.  Eyes:   Patient denies blurred vision and double vision.  Ears/ Nose/ Throat:   Patient denies sore throat and sinus problems.  Hematologic/Lymphatic:   Patient denies swollen glands and easy bruising.  Cardiovascular:   Patient denies leg swelling and chest pains.  Respiratory:   Patient denies cough and shortness of breath.  Endocrine:   Patient denies excessive thirst.  Musculoskeletal:   Patient denies back pain and joint pain.  Neurological:   Patient denies headaches and dizziness.  Psychologic:   Patient denies depression and anxiety.   VITAL SIGNS: None   GU PHYSICAL EXAMINATION:      Notes: He does have an indwelling Foley catheter draining grossly clear urine.   MULTI-SYSTEM PHYSICAL EXAMINATION:    Constitutional: Well-nourished. No physical deformities. Normally developed. Good grooming.  Respiratory: No labored breathing, no use of accessory muscles.   Cardiovascular: Normal temperature, normal extremity pulses, no swelling, no varicosities.     Complexity of Data:  Records Review:   Previous Patient Records  X-Ray Review: Renal Ultrasound: Reviewed Films.  Notes:                     I independently reviewed his renal ultrasound. He does have some mild fullness of the renal collecting systems bilaterally but his hydronephrosis is largely resolved. His bladder is decompressed with an indwelling Foley catheter.   PROCEDURES:         Renal Ultrasound  - 40981  Right kidney  Length: 9.3 cm Depth: 5.2 cm Cortical Width: 1.4 cm Width: 5.6 cm  Left Kidney Length: 12.3cm Depth: 5.4 cm Cortical Width: 1.8cm Width: 5.7 cm   Left Kidney/Ureter:  fullness noted- appears improved from previous US.   Right Kidney/Ureter:  fullness noted-appears improved from previous US  Bladder:  catheter in place      . Patient confirmed No Neulasta OnPro Device.          Simple Foley Indwelling Cath Change - [id number]  The patient's indwelling foley tube was carefully removed. A 16 Fr [place] Coude catheter was inserted into the bladder using sterile technique. The patient was taught routine catheter care.   ASSESSMENT:      ICD-10 Details  1 GU:   Prostate Cancer - C61   2   Urinary Retention - R33.8    PLAN:           Orders Labs BMP, Urine Culture          Schedule Return Visit/Planned Activity: Other See Visit Notes             Note: Will call to schedule surgery.          Document Letter(s):  Created for Patient: Clinical Summary         Notes:   1. Metastatic prostate cancer: I encouraged him to follow-up with Dr. Mosetta Putt and to proceed with androgen deprivation therapy as well as escalated systemic treatment. He seems to be more agreeable to this and will discuss with her at his next appointment. So far, he has tolerated androgen deprivation therapy quite well with minimal side effects.   2. Urinary retention: This is almost certainly related to locally advanced prostate cancer. His renal function will be checked today and his hydronephrosis has mostly resolved. We discussed the option of a channel TURP which should have an 85% chance of success in this situation to resolve his urinary retention. We reviewed that procedure in detail today including the potential risks, complications, and the expected recovery process. He is agreeable to proceed and this will be scheduled. His urine will be cultured today.   3. Erectile dysfunction: He did have  some questions about erectile dysfunction today. He understands that this may be impacted by his androgen deprivation therapy for his prostate cancer but that we could certainly address this in the future if he is so inclined.   CC: Dr. [name]          Next Appointment:      Next Appointment: 05/12/2023 09:45 AM    Appointment Type: Surgery     Location: [facility] [phone]    Provider: Heloise Purpura, M.D.    Reason for Visit: WL/OP CYSTO, TURP      * Signed by Heloise Purpura, M.D. on 04/20/23 at 11:45 AM (EDT)*

## 2023-05-12 ENCOUNTER — Other Ambulatory Visit: Payer: Self-pay

## 2023-05-12 ENCOUNTER — Ambulatory Visit (HOSPITAL_BASED_OUTPATIENT_CLINIC_OR_DEPARTMENT_OTHER): Payer: 59 | Admitting: Anesthesiology

## 2023-05-12 ENCOUNTER — Observation Stay (HOSPITAL_COMMUNITY)
Admission: RE | Admit: 2023-05-12 | Discharge: 2023-05-13 | Disposition: A | Discharge status: Home / Self Care | Payer: 59 | Source: Ambulatory Visit | Attending: Urology | Admitting: Urology

## 2023-05-12 ENCOUNTER — Ambulatory Visit (HOSPITAL_COMMUNITY): Payer: 59 | Admitting: Physician Assistant

## 2023-05-12 ENCOUNTER — Encounter (HOSPITAL_COMMUNITY): Payer: Self-pay | Admitting: Urology

## 2023-05-12 ENCOUNTER — Other Ambulatory Visit: Payer: 59

## 2023-05-12 ENCOUNTER — Ambulatory Visit: Payer: 59 | Admitting: Hematology

## 2023-05-12 ENCOUNTER — Encounter (HOSPITAL_COMMUNITY)
Admission: RE | Disposition: A | Discharge status: Home / Self Care | Payer: Self-pay | Source: Ambulatory Visit | Attending: Urology

## 2023-05-12 DIAGNOSIS — Z79899 Other long term (current) drug therapy: Secondary | ICD-10-CM | POA: Diagnosis not present

## 2023-05-12 DIAGNOSIS — N189 Chronic kidney disease, unspecified: Secondary | ICD-10-CM | POA: Diagnosis not present

## 2023-05-12 DIAGNOSIS — C61 Malignant neoplasm of prostate: Secondary | ICD-10-CM

## 2023-05-12 DIAGNOSIS — E875 Hyperkalemia: Secondary | ICD-10-CM | POA: Diagnosis not present

## 2023-05-12 DIAGNOSIS — R339 Retention of urine, unspecified: Secondary | ICD-10-CM | POA: Insufficient documentation

## 2023-05-12 DIAGNOSIS — I10 Essential (primary) hypertension: Secondary | ICD-10-CM

## 2023-05-12 DIAGNOSIS — R7309 Other abnormal glucose: Secondary | ICD-10-CM | POA: Insufficient documentation

## 2023-05-12 DIAGNOSIS — N320 Bladder-neck obstruction: Secondary | ICD-10-CM | POA: Insufficient documentation

## 2023-05-12 DIAGNOSIS — E119 Type 2 diabetes mellitus without complications: Secondary | ICD-10-CM

## 2023-05-12 HISTORY — PX: CYSTOSCOPY: SHX5120

## 2023-05-12 HISTORY — PX: TRANSURETHRAL RESECTION OF PROSTATE: SHX73

## 2023-05-12 LAB — GLUCOSE, CAPILLARY: Glucose-Capillary: 122 mg/dL — ABNORMAL HIGH (ref 70–99)

## 2023-05-12 LAB — BASIC METABOLIC PANEL
Anion gap: 10 (ref 5–15)
BUN: 22 mg/dL (ref 8–23)
CO2: 23 mmol/L (ref 22–32)
Calcium: 9 mg/dL (ref 8.9–10.3)
Chloride: 101 mmol/L (ref 98–111)
Creatinine, Ser: 1.45 mg/dL — ABNORMAL HIGH (ref 0.61–1.24)
GFR, Estimated: 54 mL/min — ABNORMAL LOW (ref >60)
Glucose, Bld: 105 mg/dL — ABNORMAL HIGH (ref 70–99)
Potassium: 4.9 mmol/L (ref 3.5–5.1)
Sodium: 134 mmol/L — ABNORMAL LOW (ref 135–145)

## 2023-05-12 SURGERY — TURP (TRANSURETHRAL RESECTION OF PROSTATE)
Anesthesia: General

## 2023-05-12 MED ORDER — SODIUM CHLORIDE 0.9 % IR SOLN
3000.0000 mL | Status: DC
  Administered 2023-05-12: 3000 mL

## 2023-05-12 MED ORDER — LATANOPROST 0.005 % OP SOLN
1.0000 [drp] | Freq: Every day | OPHTHALMIC | Status: DC
  Administered 2023-05-12: 1 [drp] via OPHTHALMIC
  Filled 2023-05-12: qty 2.5

## 2023-05-12 MED ORDER — ACETAMINOPHEN 500 MG PO TABS
1000.0000 mg | ORAL_TABLET | Freq: Once | ORAL | Status: AC
  Administered 2023-05-12: 1000 mg via ORAL
  Filled 2023-05-12: qty 2

## 2023-05-12 MED ORDER — CARVEDILOL 25 MG PO TABS
25.0000 mg | ORAL_TABLET | Freq: Every day | ORAL | Status: DC
  Administered 2023-05-13: 25 mg via ORAL
  Filled 2023-05-12: qty 1

## 2023-05-12 MED ORDER — LIDOCAINE 2% (20 MG/ML) 5 ML SYRINGE
INTRAMUSCULAR | Status: DC | PRN
  Administered 2023-05-12: 60 mg via INTRAVENOUS

## 2023-05-12 MED ORDER — ONDANSETRON HCL 4 MG/2ML IJ SOLN
INTRAMUSCULAR | Status: DC | PRN
  Administered 2023-05-12: 4 mg via INTRAVENOUS

## 2023-05-12 MED ORDER — SODIUM CHLORIDE 0.9 % IR SOLN
Status: DC | PRN
  Administered 2023-05-12: 12000 mL

## 2023-05-12 MED ORDER — DEXAMETHASONE SODIUM PHOSPHATE 10 MG/ML IJ SOLN
INTRAMUSCULAR | Status: AC
  Filled 2023-05-12: qty 1

## 2023-05-12 MED ORDER — DIPHENHYDRAMINE HCL 50 MG/ML IJ SOLN: 12.5000 mg | Freq: Four times a day (QID) | INTRAMUSCULAR | Status: DC | PRN

## 2023-05-12 MED ORDER — ONDANSETRON HCL 4 MG/2ML IJ SOLN: 4.0000 mg | Freq: Once | INTRAMUSCULAR | Status: DC | PRN

## 2023-05-12 MED ORDER — ABIRATERONE ACETATE 250 MG PO TABS: 1000.0000 mg | ORAL_TABLET | Freq: Every day | ORAL | Status: DC

## 2023-05-12 MED ORDER — ORAL CARE MOUTH RINSE: 15.0000 mL | Freq: Once | OROMUCOSAL | Status: AC

## 2023-05-12 MED ORDER — LIDOCAINE HCL (PF) 2 % IJ SOLN
INTRAMUSCULAR | Status: AC
  Filled 2023-05-12: qty 5

## 2023-05-12 MED ORDER — DIPHENHYDRAMINE HCL 12.5 MG/5ML PO ELIX: 12.5000 mg | ORAL_SOLUTION | Freq: Four times a day (QID) | ORAL | Status: DC | PRN

## 2023-05-12 MED ORDER — PROPOFOL 10 MG/ML IV BOLUS
INTRAVENOUS | Status: AC
  Filled 2023-05-12: qty 20

## 2023-05-12 MED ORDER — FENTANYL CITRATE (PF) 100 MCG/2ML IJ SOLN
INTRAMUSCULAR | Status: DC | PRN
  Administered 2023-05-12: 25 ug via INTRAVENOUS
  Administered 2023-05-12: 50 ug via INTRAVENOUS
  Administered 2023-05-12: 25 ug via INTRAVENOUS

## 2023-05-12 MED ORDER — FENTANYL CITRATE (PF) 100 MCG/2ML IJ SOLN
INTRAMUSCULAR | Status: AC
  Filled 2023-05-12: qty 2

## 2023-05-12 MED ORDER — SODIUM CHLORIDE 0.9 % IV SOLN: 250.0000 mL | INTRAVENOUS | Status: DC | PRN

## 2023-05-12 MED ORDER — HYDROCODONE-ACETAMINOPHEN 5-325 MG PO TABS
1.0000 | ORAL_TABLET | ORAL | Status: DC | PRN
  Administered 2023-05-12 – 2023-05-13 (×4): 1 via ORAL
  Filled 2023-05-12 (×4): qty 1

## 2023-05-12 MED ORDER — OCUVITE-LUTEIN PO CAPS
1.0000 | ORAL_CAPSULE | Freq: Every day | ORAL | Status: DC
  Administered 2023-05-13: 1 via ORAL
  Filled 2023-05-12: qty 1

## 2023-05-12 MED ORDER — CHLORHEXIDINE GLUCONATE 0.12 % MT SOLN
15.0000 mL | Freq: Once | OROMUCOSAL | Status: AC
  Administered 2023-05-12: 15 mL via OROMUCOSAL

## 2023-05-12 MED ORDER — EPHEDRINE 5 MG/ML INJ
INTRAVENOUS | Status: AC
  Filled 2023-05-12: qty 5

## 2023-05-12 MED ORDER — LACTATED RINGERS IV SOLN: INTRAVENOUS | Status: DC

## 2023-05-12 MED ORDER — SODIUM CHLORIDE 0.9 % IV SOLN
1.0000 g | INTRAVENOUS | Status: DC
  Administered 2023-05-13: 1 g via INTRAVENOUS
  Filled 2023-05-12: qty 10

## 2023-05-12 MED ORDER — BISACODYL 5 MG PO TBEC: 10.0000 mg | DELAYED_RELEASE_TABLET | Freq: Every day | ORAL | Status: DC | PRN

## 2023-05-12 MED ORDER — STERILE WATER FOR IRRIGATION IR SOLN
Status: DC | PRN
  Administered 2023-05-12: 500 mL

## 2023-05-12 MED ORDER — ONDANSETRON HCL 4 MG/2ML IJ SOLN: 4.0000 mg | INTRAMUSCULAR | Status: DC | PRN

## 2023-05-12 MED ORDER — MIDAZOLAM HCL 2 MG/2ML IJ SOLN
INTRAMUSCULAR | Status: AC
  Filled 2023-05-12: qty 2

## 2023-05-12 MED ORDER — EPHEDRINE SULFATE-NACL 50-0.9 MG/10ML-% IV SOSY
PREFILLED_SYRINGE | INTRAVENOUS | Status: DC | PRN
  Administered 2023-05-12 (×2): 5 mg via INTRAVENOUS
  Administered 2023-05-12: 10 mg via INTRAVENOUS
  Administered 2023-05-12: 5 mg via INTRAVENOUS

## 2023-05-12 MED ORDER — ONDANSETRON HCL 4 MG/2ML IJ SOLN
INTRAMUSCULAR | Status: AC
  Filled 2023-05-12: qty 2

## 2023-05-12 MED ORDER — SODIUM CHLORIDE 0.9 % IV SOLN
2.0000 g | INTRAVENOUS | Status: AC
  Administered 2023-05-12: 2 g via INTRAVENOUS
  Filled 2023-05-12: qty 20

## 2023-05-12 MED ORDER — HYDROCODONE-ACETAMINOPHEN 5-325 MG PO TABS: 1.0000 | ORAL_TABLET | Freq: Four times a day (QID) | ORAL | 0 refills | Status: DC | PRN

## 2023-05-12 MED ORDER — CHLORHEXIDINE GLUCONATE CLOTH 2 % EX PADS: 6.0000 | MEDICATED_PAD | Freq: Every day | CUTANEOUS | Status: DC

## 2023-05-12 MED ORDER — SODIUM CHLORIDE 0.9% FLUSH: 3.0000 mL | INTRAVENOUS | Status: DC | PRN

## 2023-05-12 MED ORDER — OXYCODONE HCL 5 MG PO TABS: 5.0000 mg | ORAL_TABLET | Freq: Once | ORAL | Status: DC | PRN

## 2023-05-12 MED ORDER — PREDNISONE 5 MG PO TABS
5.0000 mg | ORAL_TABLET | Freq: Every day | ORAL | Status: DC
  Administered 2023-05-13: 5 mg via ORAL
  Filled 2023-05-12: qty 1

## 2023-05-12 MED ORDER — OXYCODONE HCL 5 MG/5ML PO SOLN: 5.0000 mg | Freq: Once | ORAL | Status: DC | PRN

## 2023-05-12 MED ORDER — DEXAMETHASONE SODIUM PHOSPHATE 10 MG/ML IJ SOLN
INTRAMUSCULAR | Status: DC | PRN
  Administered 2023-05-12: 10 mg via INTRAVENOUS

## 2023-05-12 MED ORDER — SODIUM CHLORIDE 0.45 % IV SOLN
INTRAVENOUS | Status: DC
  Administered 2023-05-12: 1000 mL via INTRAVENOUS

## 2023-05-12 MED ORDER — 0.9 % SODIUM CHLORIDE (POUR BTL) OPTIME
TOPICAL | Status: DC | PRN
  Administered 2023-05-12: 1000 mL

## 2023-05-12 MED ORDER — PROPOFOL 10 MG/ML IV BOLUS
INTRAVENOUS | Status: DC | PRN
Start: 2023-05-12 — End: 2023-05-12
  Administered 2023-05-12: 160 mg via INTRAVENOUS

## 2023-05-12 MED ORDER — HYOSCYAMINE SULFATE 0.125 MG SL SUBL: 0.1250 mg | SUBLINGUAL_TABLET | Freq: Four times a day (QID) | SUBLINGUAL | Status: DC | PRN

## 2023-05-12 MED ORDER — AMLODIPINE BESYLATE 10 MG PO TABS
5.0000 mg | ORAL_TABLET | Freq: Every day | ORAL | Status: DC
  Administered 2023-05-13: 5 mg via ORAL
  Filled 2023-05-12: qty 1

## 2023-05-12 MED ORDER — SODIUM CHLORIDE 0.9% FLUSH
3.0000 mL | Freq: Two times a day (BID) | INTRAVENOUS | Status: DC
  Administered 2023-05-13: 3 mL via INTRAVENOUS

## 2023-05-12 MED ORDER — FENTANYL CITRATE PF 50 MCG/ML IJ SOSY: 25.0000 ug | PREFILLED_SYRINGE | INTRAMUSCULAR | Status: DC | PRN

## 2023-05-12 MED ORDER — ATORVASTATIN CALCIUM 40 MG PO TABS
40.0000 mg | ORAL_TABLET | Freq: Every day | ORAL | Status: DC
  Administered 2023-05-13: 40 mg via ORAL
  Filled 2023-05-12: qty 1

## 2023-05-12 MED ORDER — MIDAZOLAM HCL 5 MG/5ML IJ SOLN
INTRAMUSCULAR | Status: DC | PRN
  Administered 2023-05-12: 2 mg via INTRAVENOUS

## 2023-05-12 MED ORDER — DOCUSATE SODIUM 100 MG PO CAPS
100.0000 mg | ORAL_CAPSULE | Freq: Two times a day (BID) | ORAL | Status: DC
  Administered 2023-05-12 – 2023-05-13 (×3): 100 mg via ORAL
  Filled 2023-05-12 (×3): qty 1

## 2023-05-12 SURGICAL SUPPLY — 24 items
BAG DRN RND TRDRP ANRFLXCHMBR (UROLOGICAL SUPPLIES) ×1
BAG URINE DRAIN 2000ML AR STRL (UROLOGICAL SUPPLIES) ×1 IMPLANT
BAG URO CATCHER STRL LF (MISCELLANEOUS) ×1 IMPLANT
CATH HEMA 3WAY 30CC 22FR COUDE (CATHETERS) IMPLANT
CATH URETL OPEN END 6FR 70 (CATHETERS) IMPLANT
CLOTH BEACON ORANGE TIMEOUT ST (SAFETY) ×1 IMPLANT
DRAPE FOOT SWITCH (DRAPES) ×1 IMPLANT
GLOVE SURG LX STRL 7.5 STRW (GLOVE) ×1 IMPLANT
GOWN STRL REUS W/ TWL XL LVL3 (GOWN DISPOSABLE) ×1 IMPLANT
GOWN STRL REUS W/TWL XL LVL3 (GOWN DISPOSABLE) ×1
GUIDEWIRE STR DUAL SENSOR (WIRE) ×1 IMPLANT
GUIDEWIRE ZIPWRE .038 STRAIGHT (WIRE) IMPLANT
HOLDER FOLEY CATH W/STRAP (MISCELLANEOUS) IMPLANT
KIT TURNOVER KIT A (KITS) IMPLANT
LOOP CUT BIPOLAR 24F LRG (ELECTROSURGICAL) IMPLANT
MANIFOLD NEPTUNE II (INSTRUMENTS) ×1 IMPLANT
PACK CYSTO (CUSTOM PROCEDURE TRAY) ×1 IMPLANT
PENCIL SMOKE EVACUATOR (MISCELLANEOUS) IMPLANT
SLEEVE SURGEON STRL (DRAPES) IMPLANT
SYR 30ML LL (SYRINGE) ×1 IMPLANT
SYR TOOMEY IRRIG 70ML (MISCELLANEOUS) ×1
SYRINGE TOOMEY IRRIG 70ML (MISCELLANEOUS) ×1 IMPLANT
TUBING CONNECTING 10 (TUBING) ×1 IMPLANT
TUBING UROLOGY SET (TUBING) ×1 IMPLANT

## 2023-05-12 NOTE — Anesthesia Procedure Notes (Signed)
Procedure Name: LMA Insertion Date/Time: 05/12/2023 9:15 AM  Performed by: Demetri Goshert D, CRNAPre-anesthesia Checklist: Patient identified, Emergency Drugs available, Suction available and Patient being monitored Patient Re-evaluated:Patient Re-evaluated prior to induction Oxygen Delivery Method: Circle system utilized Preoxygenation: Pre-oxygenation with 100% oxygen Induction Type: IV induction Ventilation: Mask ventilation without difficulty LMA: LMA inserted LMA Size: 4.0 Tube type: Oral Number of attempts: 1 Placement Confirmation: positive ETCO2 and breath sounds checked- equal and bilateral Tube secured with: Tape Dental Injury: Teeth and Oropharynx as per pre-operative assessment

## 2023-05-12 NOTE — Anesthesia Postprocedure Evaluation (Signed)
Anesthesia Post Note  Patient: Carlos Martin  Procedure(s) Performed: TRANSURETHRAL RESECTION OF THE PROSTATE (TURP) CYSTOSCOPY     Patient location during evaluation: PACU Anesthesia Type: General Level of consciousness: awake and alert Pain management: pain level controlled Vital Signs Assessment: post-procedure vital signs reviewed and stable Respiratory status: spontaneous breathing, nonlabored ventilation and respiratory function stable Cardiovascular status: stable and blood pressure returned to baseline Anesthetic complications: no   No notable events documented.  Last Vitals:  Vitals:   05/12/23 1145 05/12/23 1228  BP: 126/69 131/72  Pulse: 69 68  Resp: 11 (!) 22  Temp:  36.8 C  SpO2: 99% 99%    Last Pain:  Vitals:   05/12/23 1228  TempSrc: Oral  PainSc: 4                  Beryle Lathe

## 2023-05-12 NOTE — Plan of Care (Signed)
  Problem: Education: Goal: Knowledge of the prescribed therapeutic regimen will improve Outcome: Progressing   Problem: Health Behavior/Discharge Planning: Goal: Identification of resources available to assist in meeting health care needs will improve Outcome: Progressing   Problem: Urinary Elimination: Goal: Ability to avoid or minimize complications of infection will improve Outcome: Progressing

## 2023-05-12 NOTE — Interval H&P Note (Signed)
History and Physical Interval Note:  05/12/2023 8:39 AM  Carlos Martin  has presented today for surgery, with the diagnosis of PROSTATE CANCER, URINARY RETENTION.  The various methods of treatment have been discussed with the patient and family. After consideration of risks, benefits and other options for treatment, the patient has consented to  Procedure(s) with comments: TRANSURETHRAL RESECTION OF THE PROSTATE (TURP) (N/A) - 90 MINUTES NEEDED FOR CASE CYSTOSCOPY (N/A) as a surgical intervention.  The patient's history has been reviewed, patient examined, no change in status, stable for surgery.  I have reviewed the patient's chart and labs.  Questions were answered to the patient's satisfaction.     [employer]

## 2023-05-12 NOTE — Anesthesia Preprocedure Evaluation (Addendum)
Anesthesia Evaluation  Patient identified by MRN, date of birth, ID band Patient awake    Reviewed: Allergy & Precautions, NPO status , Patient's Chart, lab work & pertinent test results  History of Anesthesia Complications Negative for: history of anesthetic complications  Airway Mallampati: III  TM Distance: >3 FB Neck ROM: Full    Dental  (+) Dental Advisory Given  Complaining of dental pain on left lower side. No visible signs of abscess on brief superficial examination :   Pulmonary neg pulmonary ROS   Pulmonary exam normal        Cardiovascular hypertension, Pt. on home beta blockers and Pt. on medications Normal cardiovascular exam     Neuro/Psych negative neurological ROS  negative psych ROS   GI/Hepatic negative GI ROS, Neg liver ROS,,,  Endo/Other  negative endocrine ROS    Renal/GU Renal InsufficiencyRenal disease    Prostate cancer     Musculoskeletal  Bone mets    Abdominal   Peds  Hematology  (+) Blood dyscrasia, anemia  Plt 139k    Anesthesia Other Findings   Reproductive/Obstetrics                             Anesthesia Physical Anesthesia Plan  ASA: 3  Anesthesia Plan: General   Post-op Pain Management: Tylenol PO (pre-op)*   Induction: Intravenous  PONV Risk Score and Plan: 2 and Treatment may vary due to age or medical condition, Ondansetron, Dexamethasone and Midazolam  Airway Management Planned: LMA  Additional Equipment: None  Intra-op Plan:   Post-operative Plan: Extubation in OR  Informed Consent: I have reviewed the patients History and Physical, chart, labs and discussed the procedure including the risks, benefits and alternatives for the proposed anesthesia with the patient or authorized representative who has indicated his/her understanding and acceptance.     Dental advisory given  Plan Discussed with: CRNA and  Anesthesiologist  Anesthesia Plan Comments:         Anesthesia Quick Evaluation

## 2023-05-12 NOTE — Op Note (Signed)
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 M.D.  Anesthesia: General  Complications: None  EBL: Minimal  Specimens: Prostate chips  Disposition of specimens: Pathology  Indication: Carlos Martin is a patient with metastatic and locally advanced prostate cancer who presented with urinary retention and acute renal failure requiring catheter placement. After reviewing the management options for treatment, he elected to proceed with the above surgical procedure(s). We have discussed the potential benefits and risks of the procedure, side effects of the proposed treatment, the likelihood of the patient achieving the goals of the procedure, and any potential problems that might occur during the procedure or recuperation. Informed consent has been obtained.  Description of procedure:  The patient was taken to the operating room and general anesthesia was induced.  The patient was placed in the dorsal lithotomy position, prepped and draped in the usual sterile fashion, and preoperative antibiotics were administered. A preoperative time-out was performed.   Cystourethroscopy was performed.  The patient's urethra was examined and was normal.  There was some asymmetric discolored growth of the prostate and at the trigone of the bladder consistent with locally advanced prostate cancer.  The ureteral orifices were identified and appeared to be effluxing bilaterally.   The bladder was then systematically examined in its entirety. There was no evidence of any bladder tumors or stones.  The ureteral orifices were identified and marked so as to be avoided during the procedure.  The obstructing prostate tissue was then resected utilizing loop  cautery resection with the bipolar cutting loop.  The prostate adenoma and any obstructing tissue from the bladder neck back to the verumontanum was resected beginning at the six o'clock position and then extended to include the right and left lobes of the prostate and anterior prostate. Care was taken not to resect distal to the verumontanum.  Hemostasis was then achieved with the cautery and the bladder was emptied and reinspected with no significant bleeding noted at the end of the procedure.    A 3 way catheter was then placed into the bladder and placed on continuous bladder irrigation.  The patient appeared to tolerate the procedure well and without complications.  The patient was able to be awakened and transferred to the recovery unit in satisfactory condition.

## 2023-05-12 NOTE — Discharge Instructions (Signed)
You may see some blood in the urine and may have some burning with urination for 48-72 hours. You also may notice that you have to urinate more frequently or urgently after your procedure which is normal.  You should call should you develop an inability urinate, fever > 101, persistent nausea and vomiting that prevents you from eating or drinking to stay hydrated.  If you have a catheter, you will be taught how to take care of the catheter by the nursing staff prior to discharge from the hospital.  You may periodically feel a strong urge to void with the catheter in place.  This is a bladder spasm and most often can occur when having a bowel movement or moving around. It is typically self-limited and usually will stop after a few minutes.  You may use some Vaseline or Neosporin around the tip of the catheter to reduce friction at the tip of the penis. You may also see some blood in the urine.  A very small amount of blood can make the urine look quite red.  As long as the catheter is draining well, there usually is not a problem.  However, if the catheter is not draining well and is bloody, you should call the office ([phone]) to notify us.  

## 2023-05-12 NOTE — Transfer of Care (Signed)
Immediate Anesthesia Transfer of Care Note  Patient: Carlos Martin  Procedure(s) Performed: TRANSURETHRAL RESECTION OF THE PROSTATE (TURP) CYSTOSCOPY  Patient Location: PACU  Anesthesia Type:General  Level of Consciousness: awake, alert , and oriented  Airway & Oxygen Therapy: Patient Spontanous Breathing and Patient connected to face mask oxygen  Post-op Assessment: Report given to RN and Post -op Vital signs reviewed and stable  Post vital signs: Reviewed and stable  Last Vitals:  Vitals Value Taken Time  BP 118/62 05/12/23 1007  Temp    Pulse 72 05/12/23 1011  Resp 14 05/12/23 1011  SpO2 100 % 05/12/23 1011  Vitals shown include unfiled device data.  Last Pain:  Vitals:   05/12/23 0752  TempSrc:   PainSc: 8          Complications: No notable events documented.

## 2023-05-12 NOTE — Interval H&P Note (Signed)
History and Physical Interval Note:  05/12/2023 7:56 AM  Carlos Martin  has presented today for surgery, with the diagnosis of PROSTATE CANCER, URINARY RETENTION.  The various methods of treatment have been discussed with the patient and family. After consideration of risks, benefits and other options for treatment, the patient has consented to  Procedure(s) with comments: TRANSURETHRAL RESECTION OF THE PROSTATE (TURP) (N/A) - 90 MINUTES NEEDED FOR CASE CYSTOSCOPY (N/A) as a surgical intervention.  The patient's history has been reviewed, patient examined, no change in status, stable for surgery.  I have reviewed the patient's chart and labs.  Questions were answered to the patient's satisfaction.     [employer]

## 2023-05-13 ENCOUNTER — Encounter: Payer: Self-pay | Admitting: Hematology

## 2023-05-13 ENCOUNTER — Encounter (HOSPITAL_COMMUNITY): Payer: Self-pay | Admitting: Urology

## 2023-05-13 DIAGNOSIS — N189 Chronic kidney disease, unspecified: Secondary | ICD-10-CM | POA: Diagnosis not present

## 2023-05-13 DIAGNOSIS — N320 Bladder-neck obstruction: Secondary | ICD-10-CM | POA: Diagnosis not present

## 2023-05-13 DIAGNOSIS — R339 Retention of urine, unspecified: Secondary | ICD-10-CM | POA: Diagnosis not present

## 2023-05-13 DIAGNOSIS — C61 Malignant neoplasm of prostate: Secondary | ICD-10-CM | POA: Diagnosis not present

## 2023-05-13 DIAGNOSIS — Z79899 Other long term (current) drug therapy: Secondary | ICD-10-CM | POA: Diagnosis not present

## 2023-05-13 DIAGNOSIS — R7309 Other abnormal glucose: Secondary | ICD-10-CM | POA: Diagnosis not present

## 2023-05-13 NOTE — Progress Notes (Signed)
Patient also approved for one-time $200 Prostate grant to assist with medication and gas cards only. He verbalized understanding.

## 2023-05-13 NOTE — Plan of Care (Signed)
?  Problem: Education: ?Goal: Knowledge of the prescribed therapeutic regimen will improve ?Outcome: Adequate for Discharge ?  ?Problem: Bowel/Gastric: ?Goal: Gastrointestinal status for postoperative course will improve ?Outcome: Adequate for Discharge ?  ?Problem: Health Behavior/Discharge Planning: ?Goal: Identification of resources available to assist in meeting health care needs will improve ?Outcome: Adequate for Discharge ?  ?Problem: Skin Integrity: ?Goal: Demonstration of wound healing without infection will improve ?Outcome: Adequate for Discharge ?  ?Problem: Urinary Elimination: ?Goal: Ability to avoid or minimize complications of infection will improve ?Outcome: Adequate for Discharge ?  ?Problem: Education: ?Goal: Knowledge of General Education information will improve ?Description: Including pain rating scale, medication(s)/side effects and non-pharmacologic comfort measures ?Outcome: Adequate for Discharge ?  ?Problem: Health Behavior/Discharge Planning: ?Goal: Ability to manage health-related needs will improve ?Outcome: Adequate for Discharge ?  ?Problem: Clinical Measurements: ?Goal: Ability to maintain clinical measurements within normal limits will improve ?Outcome: Adequate for Discharge ?Goal: Will remain free from infection ?Outcome: Adequate for Discharge ?Goal: Diagnostic test results will improve ?Outcome: Adequate for Discharge ?Goal: Respiratory complications will improve ?Outcome: Adequate for Discharge ?Goal: Cardiovascular complication will be avoided ?Outcome: Adequate for Discharge ?  ?Problem: Activity: ?Goal: Risk for activity intolerance will decrease ?Outcome: Adequate for Discharge ?  ?Problem: Nutrition: ?Goal: Adequate nutrition will be maintained ?Outcome: Adequate for Discharge ?  ?Problem: Coping: ?Goal: Level of anxiety will decrease ?Outcome: Adequate for Discharge ?  ?Problem: Elimination: ?Goal: Will not experience complications related to bowel motility ?Outcome:  Adequate for Discharge ?Goal: Will not experience complications related to urinary retention ?Outcome: Adequate for Discharge ?  ?Problem: Pain Managment: ?Goal: General experience of comfort will improve ?Outcome: Adequate for Discharge ?  ?Problem: Safety: ?Goal: Ability to remain free from injury will improve ?Outcome: Adequate for Discharge ?  ?Problem: Skin Integrity: ?Goal: Risk for impaired skin integrity will decrease ?Outcome: Adequate for Discharge ?  ?

## 2023-05-13 NOTE — Progress Notes (Signed)
Patient brought documents for [employer] grant to [facility].  Patient approved for one-time $1000 [employer] grant to assist with personal expenses while going through treatment. He received a copy of the approval letter and expense sheet in green folder. He called me per my request to discuss expenses in detail. Went over each expense and how they are submitted. He verbalized understanding.  He has my card for any additional financial questions or concerns.

## 2023-05-13 NOTE — Progress Notes (Signed)
Patient ID: Carlos Martin, male   DOB: May 26, 1956, 67 y.o.   MRN: [id number]  1 Day Post-Op Subjective: Doing well.  No issues overnight.  CBI has remained off all night.  Objective: Vital signs in last 24 hours: Temp:  [97.7 F (36.5 C)-99 F (37.2 C)] 98 F (36.7 C) (07/26 0035) Pulse Rate:  [62-75] 62 (07/26 0035) Resp:  [9-22] 18 (07/26 0035) BP: (118-136)/(62-74) 125/62 (07/26 0035) SpO2:  [97 %-100 %] 99 % (07/26 0035) Weight:  [89 kg] 89 kg (07/25 0752)  Intake/Output from previous day: 07/25 0701 - 07/26 0700 In: 3652.5 [P.O.:840; I.V.:1962.5; IV Piggyback:100] Out: 4275 [Urine:4275] Intake/Output this shift: No intake/output data recorded.  Physical Exam:  General: Alert and oriented GU: Urine clear off CBI  Lab Results: No results for input(s): "HGB", "HCT" in the last 72 hours. BMET Recent Labs    05/12/23 1014  NA 134*  K 4.9  CL 101  CO2 23  GLUCOSE 105*  BUN 22  CREATININE 1.45*  CALCIUM 9.0     Studies/Results: No results found.  Assessment/Plan: POD # 1 s/p channel TURP - Voiding trial and discharge home today   LOS: 0 days   Carlos Martin 05/13/2023, 7:32 AM

## 2023-05-13 NOTE — Discharge Summary (Signed)
  Date of admission: 05/12/2023  Date of discharge: 05/13/2023  Admission diagnosis: Prostate cancer, urinary retention  Discharge diagnosis: Prostate cancer, urinary retention  Secondary diagnoses: CKD  History and Physical: For full details, please see admission history and physical. Briefly, Carlos Martin is a 67 y.o. year old patient with metastatic and locally advanced prostate cancer with urinary retention likely due to locally advanced prostate cancer.   Hospital Course: He underwent cystoscopy and channel TURP on 05/12/23.  He tolerated this procedure well.  He required minimal CBI postoperatively which was quickly discontinued.  He underwent a voiding trial on POD#1 which he passed.  Laboratory values: No results for input(s): "HGB", "HCT" in the last 72 hours. Recent Labs    05/12/23 1014  CREATININE 1.45*    Disposition: Home  Discharge instruction: The patient was instructed to be ambulatory but told to refrain from heavy lifting, strenuous activity, or driving.   Discharge medications:  Allergies as of 05/13/2023   No Known Allergies      Medication List     TAKE these medications    abiraterone acetate 250 MG tablet Commonly known as: ZYTIGA Take 4 tablets (1,000 mg total) by mouth daily. Take on an empty stomach 1 hour before or 2 hours after a meal   amLODipine 5 MG tablet Commonly known as: NORVASC Take 5 mg by mouth daily.   atorvastatin 40 MG tablet Commonly known as: LIPITOR Take 40 mg by mouth daily.   bisacodyl 5 MG EC tablet Generic drug: bisacodyl Take 2 tablets (10 mg total) by mouth daily as needed for moderate constipation.   carvedilol 25 MG tablet Commonly known as: COREG Take 25 mg by mouth daily.   HYDROcodone-acetaminophen 5-325 MG tablet Commonly known as: NORCO/VICODIN Take 1-2 tablets by mouth every 6 (six) hours as needed.   latanoprost 0.005 % ophthalmic solution Commonly known as: Xalatan Place 1 drop into both eyes at  bedtime.   multivitamin with minerals Tabs tablet Take 1 tablet by mouth daily.   multivitamin-lutein Caps capsule Take 1 capsule by mouth daily.   predniSONE 5 MG tablet Commonly known as: DELTASONE Take 5 mg by mouth daily with breakfast.        Followup:   Follow-up Information     Heloise Purpura, MD Follow up.   Specialty: Urology Why: 06/07/23 at 10:30 AM Contact information: [address] [place] [address] [phone]

## 2023-05-13 NOTE — Progress Notes (Signed)
Patient and wife provided with discharged education, both verbalized understanding. Waiting on meds to bed.

## 2023-05-15 ENCOUNTER — Other Ambulatory Visit: Payer: Self-pay | Admitting: Student

## 2023-05-16 DIAGNOSIS — I10 Essential (primary) hypertension: Secondary | ICD-10-CM | POA: Diagnosis not present

## 2023-05-16 DIAGNOSIS — M109 Gout, unspecified: Secondary | ICD-10-CM | POA: Diagnosis not present

## 2023-05-16 DIAGNOSIS — Z6828 Body mass index (BMI) 28.0-28.9, adult: Secondary | ICD-10-CM | POA: Diagnosis not present

## 2023-05-16 MED ORDER — BISACODYL 5 MG PO TBEC
10.0000 mg | DELAYED_RELEASE_TABLET | Freq: Every day | ORAL | 0 refills | Status: AC | PRN
  Filled 2023-05-16: qty 30, 15d supply, fill #0

## 2023-05-17 ENCOUNTER — Other Ambulatory Visit: Payer: Self-pay

## 2023-05-17 ENCOUNTER — Encounter: Payer: Self-pay | Admitting: Hematology

## 2023-05-25 ENCOUNTER — Other Ambulatory Visit: Payer: Self-pay

## 2023-05-25 ENCOUNTER — Other Ambulatory Visit: Payer: Self-pay | Admitting: Hematology

## 2023-05-25 DIAGNOSIS — C7951 Secondary malignant neoplasm of bone: Secondary | ICD-10-CM

## 2023-06-07 ENCOUNTER — Other Ambulatory Visit: Payer: Self-pay

## 2023-06-07 ENCOUNTER — Other Ambulatory Visit: Payer: Self-pay | Admitting: Hematology

## 2023-06-07 DIAGNOSIS — R338 Other retention of urine: Secondary | ICD-10-CM | POA: Diagnosis not present

## 2023-06-07 DIAGNOSIS — C61 Malignant neoplasm of prostate: Secondary | ICD-10-CM

## 2023-06-07 MED ORDER — ABIRATERONE ACETATE 250 MG PO TABS: 1000.0000 mg | ORAL_TABLET | Freq: Every day | ORAL | 0 refills | Status: DC

## 2023-06-17 ENCOUNTER — Other Ambulatory Visit: Payer: Self-pay | Admitting: Hematology

## 2023-06-17 DIAGNOSIS — C61 Malignant neoplasm of prostate: Secondary | ICD-10-CM

## 2023-07-04 ENCOUNTER — Other Ambulatory Visit: Payer: Self-pay | Admitting: Hematology

## 2023-07-04 ENCOUNTER — Encounter: Payer: Self-pay | Admitting: Hematology

## 2023-07-04 DIAGNOSIS — C61 Malignant neoplasm of prostate: Secondary | ICD-10-CM

## 2023-07-04 MED ORDER — ABIRATERONE ACETATE 250 MG PO TABS
250.0000 mg | ORAL_TABLET | Freq: Every day | ORAL | 0 refills | Status: DC
Start: 2023-07-04 — End: 2023-07-05

## 2023-07-05 ENCOUNTER — Other Ambulatory Visit: Payer: Self-pay | Admitting: Hematology

## 2023-07-05 DIAGNOSIS — C7951 Secondary malignant neoplasm of bone: Secondary | ICD-10-CM

## 2023-07-05 MED ORDER — ABIRATERONE ACETATE 250 MG PO TABS: 1000.0000 mg | ORAL_TABLET | Freq: Every day | ORAL | 0 refills | Status: DC

## 2023-07-10 NOTE — Assessment & Plan Note (Addendum)
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 Bone scan and spinal MRI showed diffuse bone mets, PSA 524. Bone biopsy confirmed metastatic prostate cancer.  -I reviewed the incurable nature of his disease and treatment options and need for long treatment -he started ADT in hospital and his back pain has much improved, will continue ADT and change to Eligard on 7/8. He previouly had reservation bout the therapy, after detailed discussion he agreed to start on 7/19.  -His PSMA scan showed multiple bone mets and pelvic node metastasis  -he has started systemic therapy with Zytiga and prednisone in June 2024, he is tolerating well overall.  Will continue. -Will monitor his PSA closely, it dropped to 1.2 in July 2024  -We again reviewed the incurable nature of his metastatic disease, and the need for long-term treatment.

## 2023-07-11 ENCOUNTER — Encounter: Payer: Self-pay | Admitting: Hematology

## 2023-07-11 ENCOUNTER — Inpatient Hospital Stay: Payer: 59 | Attending: Hematology

## 2023-07-11 ENCOUNTER — Other Ambulatory Visit: Payer: Self-pay

## 2023-07-11 ENCOUNTER — Inpatient Hospital Stay (HOSPITAL_BASED_OUTPATIENT_CLINIC_OR_DEPARTMENT_OTHER): Payer: 59 | Admitting: Hematology

## 2023-07-11 VITALS — BP 144/71 | HR 71 | Temp 98.5°F | Resp 16 | Ht 68.0 in | Wt 203.1 lb

## 2023-07-11 DIAGNOSIS — C61 Malignant neoplasm of prostate: Secondary | ICD-10-CM

## 2023-07-11 DIAGNOSIS — C7951 Secondary malignant neoplasm of bone: Secondary | ICD-10-CM

## 2023-07-11 DIAGNOSIS — C775 Secondary and unspecified malignant neoplasm of intrapelvic lymph nodes: Secondary | ICD-10-CM | POA: Insufficient documentation

## 2023-07-11 DIAGNOSIS — D509 Iron deficiency anemia, unspecified: Secondary | ICD-10-CM

## 2023-07-11 DIAGNOSIS — Z9079 Acquired absence of other genital organ(s): Secondary | ICD-10-CM | POA: Insufficient documentation

## 2023-07-11 DIAGNOSIS — Z7952 Long term (current) use of systemic steroids: Secondary | ICD-10-CM | POA: Insufficient documentation

## 2023-07-11 DIAGNOSIS — Z79899 Other long term (current) drug therapy: Secondary | ICD-10-CM | POA: Insufficient documentation

## 2023-07-11 LAB — COMPREHENSIVE METABOLIC PANEL
ALT: 19 U/L (ref 0–44)
AST: 18 U/L (ref 15–41)
Albumin: 4.4 g/dL (ref 3.5–5.0)
Alkaline Phosphatase: 97 U/L (ref 38–126)
Anion gap: 8 (ref 5–15)
BUN: 24 mg/dL — ABNORMAL HIGH (ref 8–23)
CO2: 24 mmol/L (ref 22–32)
Calcium: 9.6 mg/dL (ref 8.9–10.3)
Chloride: 102 mmol/L (ref 98–111)
Creatinine, Ser: 1.84 mg/dL — ABNORMAL HIGH (ref 0.61–1.24)
GFR, Estimated: 40 mL/min — ABNORMAL LOW (ref >60)
Glucose, Bld: 167 mg/dL — ABNORMAL HIGH (ref 70–99)
Potassium: 5 mmol/L (ref 3.5–5.1)
Sodium: 134 mmol/L — ABNORMAL LOW (ref 135–145)
Total Bilirubin: 0.5 mg/dL (ref 0.3–1.2)
Total Protein: 7.9 g/dL (ref 6.5–8.1)

## 2023-07-11 LAB — CBC WITH DIFFERENTIAL/PLATELET
Abs Immature Granulocytes: 0.01 10*3/uL (ref 0.00–0.07)
Basophils Absolute: 0 10*3/uL (ref 0.0–0.1)
Basophils Relative: 0 %
Eosinophils Absolute: 0 10*3/uL (ref 0.0–0.5)
Eosinophils Relative: 1 %
HCT: 32 % — ABNORMAL LOW (ref 39.0–52.0)
Hemoglobin: 10.2 g/dL — ABNORMAL LOW (ref 13.0–17.0)
Immature Granulocytes: 0 %
Lymphocytes Relative: 24 %
Lymphs Abs: 1.4 10*3/uL (ref 0.7–4.0)
MCH: 23.7 pg — ABNORMAL LOW (ref 26.0–34.0)
MCHC: 31.9 g/dL (ref 30.0–36.0)
MCV: 74.2 fL — ABNORMAL LOW (ref 80.0–100.0)
Monocytes Absolute: 0.4 10*3/uL (ref 0.1–1.0)
Monocytes Relative: 6 %
Neutro Abs: 4 10*3/uL (ref 1.7–7.7)
Neutrophils Relative %: 69 %
Platelets: 169 10*3/uL (ref 150–400)
RBC: 4.31 MIL/uL (ref 4.22–5.81)
RDW: 14.8 % (ref 11.5–15.5)
WBC: 5.8 10*3/uL (ref 4.0–10.5)
nRBC: 0 % (ref 0.0–0.2)

## 2023-07-11 LAB — FERRITIN: Ferritin: 179 ng/mL (ref 24–336)

## 2023-07-11 MED ORDER — ABIRATERONE ACETATE 250 MG PO TABS: 1000.0000 mg | ORAL_TABLET | Freq: Every day | ORAL | 1 refills | Status: DC

## 2023-07-11 MED ORDER — PREDNISONE 5 MG PO TABS: 5.0000 mg | ORAL_TABLET | Freq: Every day | ORAL | 1 refills | Status: DC

## 2023-07-11 NOTE — Progress Notes (Signed)
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 Bone scan and spinal MRI showed diffuse bone mets, PSA 524. Bone biopsy confirmed metastatic prostate cancer.  -I reviewed the incurable nature of his disease and treatment options and need for long treatment -he started ADT in hospital and his back pain has much improved, will continue ADT and change to Eligard on 7/8. He previouly had reservation bout the therapy, after detailed discussion he agreed to start on 7/19.  -His PSMA scan showed multiple bone mets and pelvic node metastasis  -he has started systemic therapy with Zytiga and prednisone in June 2024, he is tolerating well overall.  Will continue. -Will monitor his PSA closely, it dropped to 1.2 in July 2024  -We again reviewed the incurable nature of his metastatic disease, and the need for long-term treatment.     Assessment and Plan    Stage 4 Prostate Cancer Patient is responding well to treatment with Zytiga and prednisone. No urinary issues or hematuria. PSA levels have decreased to normal. -Continue Zytiga and prednisone. -Refill Zytiga for two months. -[facility] prednisone at [facility] for three months with one refill.  Bone Metastasis Discussed the need for bone-strengthening medication to prevent fractures. -Plan to start Xgeva injections every three  months after dental clearance.  Dental Health No recent dental exam or cleaning. -Encouraged to schedule a dental appointment as soon as possible. -Invited to a free dental clinic event on October 5th.  General Health Significant weight gain since starting treatment. -Encouraged to continue taking calcium and vitamin D.  Follow-up -Return in two months for Xgeva injection. -Continue Eligard injections every six months. -Check iron studies and inform patient if low. -Consider repeat PET scan next year if patient desires.       SUMMARY OF ONCOLOGIC HISTORY: Oncology History Overview Note   Cancer Staging  Prostate cancer metastatic to bone [facility]) Staging form: Prostate, AJCC 8th Edition - Clinical stage from 03/28/2023: Stage IVB (cTX, cN0, pM1, PSA: 524) - Signed by [name], MD on 04/08/2023 Prostate specific antigen (PSA) range: 20 or greater     Prostate cancer metastatic to bone (HCC)  03/24/2023 Imaging   NM BONE SCAN WHOLE BODY   IMPRESSION: Multifocal osseous metastatic disease. Areas include numerous bilateral ribs, sternum, scapula, spine, pelvis and proximal femurs     03/28/2023 Cancer Staging   Staging form: Prostate, AJCC 8th Edition - Clinical stage from 03/28/2023: Stage IVB (cTX, cN0, pM1, PSA: 524) - Signed by [name], MD on 04/08/2023 Prostate specific antigen (PSA) range: 20 or greater   03/28/2023 Pathology Results     FINAL MICROSCOPIC DIAGNOSIS:  A. RIGHT ILIAC, BONE BIOPSY: Metastatic poorly differentiated prostatic adenocarcinoma   04/08/2023 Initial Diagnosis   Prostate cancer metastatic to bone [facility])      Discussed the use of AI scribe software for clinical note transcription  with the patient, who gave verbal consent to proceed.  History of Present Illness   The patient, with a history of stage 4 prostate cancer, presents for a follow-up visit. He reports feeling better and has regained weight after an initial loss following the start of  Eligard injections. He has no issues with urination and denies hematuria. His appetite has improved and he reports feeling stronger. He is currently on Zytiga and prednisone, which he tolerates well. He also reports a significant weight gain, from a low of 160 lbs back towards his pre-diagnosis weight of 210 lbs. He is scheduled to continue Eligard injections every six months.         All other systems were reviewed with the patient and are negative.  MEDICAL HISTORY:  Past Medical History:  Diagnosis Date   Cancer [facility])    Hypertension     SURGICAL HISTORY: Past Surgical History:  Procedure Laterality Date   COLONOSCOPY  2024   CYSTOSCOPY N/A 05/12/2023   Procedure: CYSTOSCOPY;  Surgeon: Heloise Purpura, MD;  Location: [facility];  Service: Urology;  Laterality: N/A;   NO PAST SURGERIES     TRANSURETHRAL RESECTION OF PROSTATE N/A 05/12/2023   Procedure: TRANSURETHRAL RESECTION OF THE PROSTATE (TURP);  Surgeon: Heloise Purpura, MD;  Location: [facility];  Service: Urology;  Laterality: N/A;  90 MINUTES NEEDED FOR CASE    I have reviewed the social history and family history with the patient and they are unchanged from previous note.  ALLERGIES:  has No Known Allergies.  MEDICATIONS:  Current Outpatient Medications  Medication Sig Dispense Refill   abiraterone acetate (ZYTIGA) 250 MG tablet Take 4 tablets (1,000 mg total) by mouth daily. Take on an empty stomach 1 hour before or 2 hours after a meal 120 tablet 1   amLODipine (NORVASC) 5 MG tablet Take 5 mg by mouth daily.     atorvastatin (LIPITOR) 40 MG tablet Take 40 mg by mouth daily.     bisacodyl 5 MG EC tablet Take 2 tablets (10 mg total) by mouth daily as needed for moderate constipation. 30 tablet 0   carvedilol (COREG) 25 MG tablet Take 25 mg by mouth daily.     HYDROcodone-acetaminophen (NORCO/VICODIN) 5-325 MG tablet Take 1-2 tablets by mouth every 6 (six) hours as needed. 8 tablet 0   latanoprost (XALATAN) 0.005 % ophthalmic  solution Place 1 drop into both eyes at bedtime. 2.5 mL 1   Multiple Vitamin (MULTIVITAMIN WITH MINERALS) TABS tablet Take 1 tablet by mouth daily.     multivitamin-lutein (OCUVITE-LUTEIN) CAPS capsule Take 1 capsule by mouth daily.     predniSONE (DELTASONE) 5 MG tablet Take 1 tablet (5 mg total) by mouth daily with breakfast. 90 tablet 1   No current facility-administered medications for this visit.    PHYSICAL EXAMINATION: ECOG PERFORMANCE STATUS: 0 - Asymptomatic  Vitals:   07/11/23 1045  BP: (!) 144/71  Pulse: 71  Resp: 16  Temp: 98.5 F (36.9 C)  SpO2: 100%   Wt Readings from Last 3 Encounters:  07/11/23 203 lb 1.6 oz (92.1 kg)  05/12/23 196 lb 3.4 oz (89 kg)  05/06/23 196 lb 4.8 oz (89 kg)     GENERAL:alert, no distress and comfortable SKIN: skin color, texture, turgor are normal, no rashes or significant lesions EYES: normal, Conjunctiva are pink and non-injected, sclera clear NECK: supple, thyroid normal size, non-tender, without nodularity LYMPH:  no palpable lymphadenopathy in the cervical, axillary  LUNGS: clear to auscultation and  percussion with normal breathing effort HEART: regular rate & rhythm and no murmurs and no lower extremity edema ABDOMEN:abdomen soft, non-tender and normal bowel sounds Musculoskeletal:no cyanosis of digits and no clubbing  NEURO: alert & oriented x 3 with fluent speech, no focal motor/sensory deficits   LABORATORY DATA:  I have reviewed the data as listed    Latest Ref Rng & Units 07/11/2023   10:21 AM 05/06/2023    1:45 PM 05/02/2023    1:34 PM  CBC  WBC 4.0 - 10.5 K/uL 5.8  7.7  6.6   Hemoglobin 13.0 - 17.0 g/dL 78.2  9.2  9.0   Hematocrit 39.0 - 52.0 % 32.0  27.6  29.0   Platelets 150 - 400 K/uL 169  139  141         Latest Ref Rng & Units 07/11/2023   10:21 AM 05/12/2023   10:14 AM 05/06/2023    1:45 PM  CMP  Glucose 70 - 99 mg/dL 956  213  086   BUN 8 - 23 mg/dL 24  22  29    Creatinine 0.61 - 1.24 mg/dL 5.78  4.69   6.29   Sodium 135 - 145 mmol/L 134  134  135   Potassium 3.5 - 5.1 mmol/L 5.0  4.9  4.6   Chloride 98 - 111 mmol/L 102  101  101   CO2 22 - 32 mmol/L 24  23  26    Calcium 8.9 - 10.3 mg/dL 9.6  9.0  9.3   Total Protein 6.5 - 8.1 g/dL 7.9   7.7   Total Bilirubin 0.3 - 1.2 mg/dL 0.5   0.4   Alkaline Phos 38 - 126 U/L 97   162   AST 15 - 41 U/L 18   13   ALT 0 - 44 U/L 19   11       RADIOGRAPHIC STUDIES: I have personally reviewed the radiological images as listed and agreed with the findings in the report. No results found.    No orders of the defined types were placed in this encounter.  All questions were answered. The patient knows to call the clinic with any problems, questions or concerns. No barriers to learning was detected. The total time spent in the appointment was 25 minutes.     [name], MD 07/11/2023

## 2023-07-12 ENCOUNTER — Telehealth: Payer: Self-pay | Admitting: Hematology

## 2023-07-12 LAB — PROSTATE-SPECIFIC AG, SERUM (LABCORP): Prostate Specific Ag, Serum: <0.1 ng/mL (ref 0.0–4.0)

## 2023-07-18 ENCOUNTER — Telehealth: Payer: Self-pay

## 2023-07-18 NOTE — Telephone Encounter (Signed)
Incoming fax from pharmacy requesting a rx refill for Latanoprost, medication has expired off of med list.  [facility] - [place] ([place]), [place] - [address]

## 2023-07-18 NOTE — Telephone Encounter (Signed)
Next appt scheduled tomorrow w/PCP.

## 2023-07-19 ENCOUNTER — Encounter: Payer: Self-pay | Admitting: Student

## 2023-07-19 ENCOUNTER — Other Ambulatory Visit: Payer: Self-pay

## 2023-07-19 ENCOUNTER — Ambulatory Visit (INDEPENDENT_AMBULATORY_CARE_PROVIDER_SITE_OTHER): Payer: 59 | Admitting: Student

## 2023-07-19 VITALS — BP 139/75 | HR 68 | Temp 98.4°F | Resp 32 | Ht 68.0 in | Wt 206.9 lb

## 2023-07-19 DIAGNOSIS — Z0000 Encounter for general adult medical examination without abnormal findings: Secondary | ICD-10-CM

## 2023-07-19 DIAGNOSIS — I10 Essential (primary) hypertension: Secondary | ICD-10-CM

## 2023-07-19 DIAGNOSIS — C7951 Secondary malignant neoplasm of bone: Secondary | ICD-10-CM | POA: Diagnosis not present

## 2023-07-19 DIAGNOSIS — C61 Malignant neoplasm of prostate: Secondary | ICD-10-CM

## 2023-07-19 DIAGNOSIS — Z7984 Long term (current) use of oral hypoglycemic drugs: Secondary | ICD-10-CM | POA: Diagnosis not present

## 2023-07-19 DIAGNOSIS — E119 Type 2 diabetes mellitus without complications: Secondary | ICD-10-CM

## 2023-07-19 DIAGNOSIS — H539 Unspecified visual disturbance: Secondary | ICD-10-CM

## 2023-07-19 HISTORY — DX: Type 2 diabetes mellitus without complications: E11.9

## 2023-07-19 LAB — POCT GLYCOSYLATED HEMOGLOBIN (HGB A1C): Hemoglobin A1C: 8.1 % — AB (ref 4.0–5.6)

## 2023-07-19 LAB — GLUCOSE, CAPILLARY: Glucose-Capillary: 152 mg/dL — ABNORMAL HIGH (ref 70–99)

## 2023-07-19 MED ORDER — METFORMIN HCL 500 MG PO TABS
500.0000 mg | ORAL_TABLET | Freq: Two times a day (BID) | ORAL | Status: DC
Start: 2023-07-19 — End: 2023-08-13

## 2023-07-19 MED ORDER — LATANOPROST 0.005 % OP SOLN
1.0000 [drp] | Freq: Every day | OPHTHALMIC | 1 refills | Status: DC
Start: 2023-07-19 — End: 2023-10-17

## 2023-07-19 NOTE — Progress Notes (Signed)
Established Patient Office Visit  Subjective   Patient ID: Carlos Martin, male    DOB: Feb 19, 1956  Age: 67 y.o. MRN: [id number]  Chief Complaint  Patient presents with   Follow-up    Needs letter to go back to work .     HPI  This is a 67 year old male living with a history stated below presents to clinic to get a letter to back to work. He has no acute concerns at this visit.   Patient Active Problem List   Diagnosis Date Noted   Healthcare maintenance 07/19/2023   Diabetes (HCC) 07/19/2023   Urinary retention 05/12/2023   Prostate cancer metastatic to bone (HCC) 04/08/2023   Gout 04/04/2023   Hyperglycemia 04/04/2023   Prostate mass 03/26/2023   Hydronephrosis 03/26/2023   AKI (acute kidney injury) (HCC) 03/23/2023   Hyperkalemia 03/23/2023   Microcytic anemia 03/23/2023   Metabolic acidosis, normal anion gap (NAG) 03/23/2023   Essential hypertension, benign 03/23/2023   Past Medical History:  Diagnosis Date   Cancer (HCC)    Diabetes (HCC) 07/19/2023   Hypertension    Past Surgical History:  Procedure Laterality Date   COLONOSCOPY  2024   CYSTOSCOPY N/A 05/12/2023   Procedure: CYSTOSCOPY;  Surgeon: Heloise Purpura, MD;  [facility];  Service: Urology;  Laterality: N/A;   NO PAST SURGERIES     TRANSURETHRAL RESECTION OF PROSTATE N/A 05/12/2023   Procedure: TRANSURETHRAL RESECTION OF THE PROSTATE (TURP);  Surgeon: Heloise Purpura, MD;  Location: [facility];  Service: Urology;  Laterality: N/A;  90 MINUTES NEEDED FOR CASE   Social History   Socioeconomic History   Marital status: Married    Spouse name: Not on file   Number of children: 4   Years of education: Not on file   Highest education level: Not on file  Occupational History   Not on file  Tobacco Use   Smoking status: Never   Smokeless tobacco: Not on file  Substance and Sexual Activity   Alcohol use: Yes    Alcohol/week: 1.0 standard drink of alcohol    Types: 1 Glasses of wine per week     Comment: once a month   Drug use: No   Sexual activity: Not on file  Other Topics Concern   Not on file  Social History Narrative   Not on file   Social Determinants of Health   Financial Resource Strain: High Risk (04/11/2023)   Overall Financial Resource Strain (CARDIA)    Difficulty of Paying Living Expenses: Very hard  Food Insecurity: No Food Insecurity (05/12/2023)   Hunger Vital Sign    Worried About Running Out of Food in the Last Year: Never true    Ran Out of Food in the Last Year: Never true  Transportation Needs: No Transportation Needs (05/12/2023)   PRAPARE - Administrator, Civil Service (Medical): No    Lack of Transportation (Non-Medical): No  Physical Activity: Not on file  Stress: Not on file  Social Connections: Not on file  Intimate Partner Violence: Not At Risk (05/12/2023)   Humiliation, Afraid, Rape, and Kick questionnaire    Fear of Current or Ex-Partner: No    Emotionally Abused: No    Physically Abused: No    Sexually Abused: No   No family history on file. No Known Allergies   Review of Systems  Constitutional:  Negative for chills and fever.  HENT:  Negative for congestion and sore throat.   Eyes:  Negative  for blurred vision.  Respiratory:  Negative for cough and shortness of breath.   Cardiovascular:  Negative for chest pain and leg swelling.  Gastrointestinal:  Negative for abdominal pain, blood in stool, nausea and vomiting.  Genitourinary:  Negative for dysuria and hematuria.  Musculoskeletal:  Negative for back pain, joint pain and neck pain.  Neurological:  Negative for weakness and headaches.     Objective:     BP 139/75 (BP Location: Left Arm, Patient Position: Sitting, Cuff Size: Normal)   Pulse 68   Temp 98.4 F (36.9 C) (Oral)   Resp (!) 32   Ht 5\' 8"  (1.727 m)   Wt 206 lb 14.4 oz (93.8 kg)   SpO2 99% Comment: room air  BMI 31.46 kg/m  BP Readings from Last 3 Encounters:  07/19/23 139/75  07/11/23 (!) 144/71   05/13/23 125/62   Wt Readings from Last 3 Encounters:  07/19/23 206 lb 14.4 oz (93.8 kg)  07/11/23 203 lb 1.6 oz (92.1 kg)  05/12/23 196 lb 3.4 oz (89 kg)      Physical Exam  Constitutional: well-appearing, sitting in chair, in no acute distress HENT: normocephalic atraumatic, mucous membranes moist Cardiovascular: regular rate and rhythm, no m/r/g Pulmonary/Chest: normal work of breathing on room air, lungs clear to auscultation bilaterally Abdominal: soft, non-tender, non-distended MSK: normal bulk and tone Neurological: alert & oriented x 3, no focal deficit Skin: warm and dry Psych: normal mood and behavior  Results for orders placed or performed in visit on 07/19/23  Glucose, capillary  Result Value Ref Range   Glucose-Capillary 152 (H) 70 - 99 mg/dL  POC Hbg Z6X  Result Value Ref Range   Hemoglobin A1C 8.1 (A) 4.0 - 5.6 %   HbA1c POC (<> result, manual entry)     HbA1c, POC (prediabetic range)     HbA1c, POC (controlled diabetic range)      Last CBC Lab Results  Component Value Date   WBC 5.8 07/11/2023   HGB 10.2 (L) 07/11/2023   HCT 32.0 (L) 07/11/2023   MCV 74.2 (L) 07/11/2023   MCH 23.7 (L) 07/11/2023   RDW 14.8 07/11/2023   PLT 169 07/11/2023   Last metabolic panel Lab Results  Component Value Date   GLUCOSE 167 (H) 07/11/2023   NA 134 (L) 07/11/2023   K 5.0 07/11/2023   CL 102 07/11/2023   CO2 24 07/11/2023   BUN 24 (H) 07/11/2023   CREATININE 1.84 (H) 07/11/2023   GFRNONAA 40 (L) 07/11/2023   CALCIUM 9.6 07/11/2023   PHOS 5.1 (H) 03/26/2023   PROT 7.9 07/11/2023   ALBUMIN 4.4 07/11/2023   BILITOT 0.5 07/11/2023   ALKPHOS 97 07/11/2023   AST 18 07/11/2023   ALT 19 07/11/2023   ANIONGAP 8 07/11/2023   Last hemoglobin A1c Lab Results  Component Value Date   HGBA1C 8.1 (A) 07/19/2023     The ASCVD Risk score (Arnett DK, et al., 2019) failed to calculate for the following reasons:   Cannot find a previous HDL lab   Cannot find a  previous total cholesterol lab    Assessment & Plan:   Problem List Items Addressed This Visit       Cardiovascular and Mediastinum   Essential hypertension, benign    BP Readings from Last 3 Encounters:  07/19/23 139/75  07/11/23 (!) 144/71  05/13/23 125/62  Last metabolic panel Lab Results  Component Value Date   GLUCOSE 167 (H) 07/11/2023   NA 134 (L) 07/11/2023  K 5.0 07/11/2023   CL 102 07/11/2023   CO2 24 07/11/2023   BUN 24 (H) 07/11/2023   CREATININE 1.84 (H) 07/11/2023   GFRNONAA 40 (L) 07/11/2023   CALCIUM 9.6 07/11/2023   PHOS 5.1 (H) 03/26/2023   PROT 7.9 07/11/2023   ALBUMIN 4.4 07/11/2023   BILITOT 0.5 07/11/2023   ALKPHOS 97 07/11/2023   AST 18 07/11/2023   ALT 19 07/11/2023   ANIONGAP 8 07/11/2023  Patient takes carvedilol 25 mg daily and amlodipine 5 mg daily. At home, blood pressure is around ~130s/70s. Denies any headaches, CP, SOB, LE swelling. Labs per oncologist.  -Continue carvedilol 25 mg daily and amlodipine 5 mg daily -Continue checking blood pressures at home         Endocrine   Diabetes [facility]) - Primary    Lab Results  Component Value Date   HGBA1C 8.1 (A) 07/19/2023   Per the last visit, metformin used in the past, never restarted after hospitalization. Patient reports he checks his blood glucose at home, it runs less than ~100.  -Start metformin 500 mg BID, start one tablet a day, and increase it to BID as pt tolerates.  -A1c at this visit  -Urine micro/albumin ratio at this visit       Relevant Medications   metFORMIN (GLUCOPHAGE) tablet 500 mg (Start on 07/19/2023  5:00 PM)   Other Relevant Orders   POC Hbg A1C (Completed)   Microalbumin / Creatinine Urine Ratio     Musculoskeletal and Integument   Prostate cancer metastatic to bone [facility])    Patient had a bone scan ans spinal MRI that showed bone mets with PSA 524; bone biopsy confirmed metastatic prostate cancer. Patient had TURP in 05/12/2023. Patient follows oncology  regularly; treatment as per onco. Current therapy includes: -Eligard 45 mg every 6 months -Zytiga 1000 mg daily and prednisone 5 mg daily         Other   Healthcare maintenance    -Refused flu shot at this visit      Work note is provided to the patient.   Return in about 3 months (around 10/19/2023) for A1c, DM .    Jeral Pinch, DO

## 2023-07-19 NOTE — Assessment & Plan Note (Signed)
-  Refused flu shot at this visit

## 2023-07-19 NOTE — Assessment & Plan Note (Addendum)
Lab Results  Component Value Date   HGBA1C 8.1 (A) 07/19/2023   Per the last visit, metformin used in the past, never restarted after hospitalization. Patient reports he checks his blood glucose at home, it runs less than ~100.  -Start metformin 500 mg BID, start one tablet a day, and increase it to BID as pt tolerates.  -A1c at this visit  -Urine micro/albumin ratio at this visit

## 2023-07-19 NOTE — Assessment & Plan Note (Signed)
BP Readings from Last 3 Encounters:  07/19/23 139/75  07/11/23 (!) 144/71  05/13/23 125/62  Last metabolic panel Lab Results  Component Value Date   GLUCOSE 167 (H) 07/11/2023   NA 134 (L) 07/11/2023   K 5.0 07/11/2023   CL 102 07/11/2023   CO2 24 07/11/2023   BUN 24 (H) 07/11/2023   CREATININE 1.84 (H) 07/11/2023   GFRNONAA 40 (L) 07/11/2023   CALCIUM 9.6 07/11/2023   PHOS 5.1 (H) 03/26/2023   PROT 7.9 07/11/2023   ALBUMIN 4.4 07/11/2023   BILITOT 0.5 07/11/2023   ALKPHOS 97 07/11/2023   AST 18 07/11/2023   ALT 19 07/11/2023   ANIONGAP 8 07/11/2023  Patient takes carvedilol 25 mg daily and amlodipine 5 mg daily. At home, blood pressure is around ~130s/70s. Denies any headaches, CP, SOB, LE swelling. Labs per oncologist.  -Continue carvedilol 25 mg daily and amlodipine 5 mg daily -Continue checking blood pressures at home

## 2023-07-19 NOTE — Patient Instructions (Signed)
Thank you, Carlos Martin for allowing [place] to provide your care today. Today we discussed:  Hypertension -Please continue taking carvedilol 25 mg daily and amlodipine 5 mg daily.  Please continue checking her blood pressure at home.  Diabetes -I have restarted you back on metformin 500 mg twice daily with meals. -We will recheck your hemoglobin A1c at this visit and check your kidney functions.  I will follow back with the results.  Please continue following up with your oncologist.  I have ordered the following labs for you:   Lab Orders         Microalbumin / Creatinine Urine Ratio         POC Hbg A1C      Tests ordered today:  Hemoglobin A1c, micro albumin creatinine urine ratio  Referrals ordered today:   Referral Orders  No referral(s) requested today     I have ordered the following medication/changed the following medications:   Stop the following medications: There are no discontinued medications.   Start the following medications: Meds ordered this encounter  Medications   latanoprost (XALATAN) 0.005 % ophthalmic solution    Sig: Place 1 drop into both eyes daily.    Dispense:  2.5 mL    Refill:  1   metFORMIN (GLUCOPHAGE) tablet 500 mg     Follow up: 3-4 months    Remember:   Should you have any questions or concerns please call the internal medicine clinic at [phone].     Carlos Pinch, DO [facility]

## 2023-07-19 NOTE — Assessment & Plan Note (Signed)
Patient had a bone scan ans spinal MRI that showed bone mets with PSA 524; bone biopsy confirmed metastatic prostate cancer. Patient had TURP in 05/12/2023. Patient follows oncology regularly; treatment as per onco. Current therapy includes: -Eligard 45 mg every 6 months -Zytiga 1000 mg daily and prednisone 5 mg daily

## 2023-07-20 LAB — MICROALBUMIN / CREATININE URINE RATIO
Creatinine, Urine: 29.1 mg/dL
Microalb/Creat Ratio: 284 mg/g{creat} — ABNORMAL HIGH (ref 0–29)
Microalbumin, Urine: 82.7 ug/mL

## 2023-07-21 ENCOUNTER — Other Ambulatory Visit: Payer: Self-pay | Admitting: Student

## 2023-07-21 MED ORDER — EMPAGLIFLOZIN 10 MG PO TABS: 10.0000 mg | ORAL_TABLET | Freq: Every day | ORAL | 3 refills | Status: DC

## 2023-07-21 NOTE — Progress Notes (Signed)
Internal Medicine Clinic Attending  I was physically present during the key portions of the resident provided service and participated in the medical decision making of patient's management care. I reviewed pertinent patient test results.  The assessment, diagnosis, and plan were formulated together and I agree with the documentation in the resident's note.  Mercie Eon, MD    Urine microalbumin is elevated - patient may benefit from starting SGLT2 for renal protection & glucose control.

## 2023-08-12 ENCOUNTER — Other Ambulatory Visit: Payer: Self-pay

## 2023-08-12 ENCOUNTER — Ambulatory Visit (INDEPENDENT_AMBULATORY_CARE_PROVIDER_SITE_OTHER): Payer: 59 | Admitting: Internal Medicine

## 2023-08-12 ENCOUNTER — Encounter: Payer: Self-pay | Admitting: Internal Medicine

## 2023-08-12 VITALS — BP 129/61 | HR 67 | Temp 98.0°F | Ht 69.0 in | Wt 205.4 lb

## 2023-08-12 DIAGNOSIS — N1831 Chronic kidney disease, stage 3a: Secondary | ICD-10-CM

## 2023-08-12 DIAGNOSIS — I129 Hypertensive chronic kidney disease with stage 1 through stage 4 chronic kidney disease, or unspecified chronic kidney disease: Secondary | ICD-10-CM

## 2023-08-12 DIAGNOSIS — R809 Proteinuria, unspecified: Secondary | ICD-10-CM | POA: Diagnosis not present

## 2023-08-12 DIAGNOSIS — E1129 Type 2 diabetes mellitus with other diabetic kidney complication: Secondary | ICD-10-CM

## 2023-08-12 DIAGNOSIS — I10 Essential (primary) hypertension: Secondary | ICD-10-CM

## 2023-08-12 DIAGNOSIS — E1122 Type 2 diabetes mellitus with diabetic chronic kidney disease: Secondary | ICD-10-CM | POA: Diagnosis not present

## 2023-08-12 NOTE — Patient Instructions (Signed)
Thank you, Mr.Domanick Verley for allowing [place] to provide your care today.   Blood pressure Please go to pharmacy or we can also do a RN only visit in 2 weeks to calibrate home bp cuff.  Follow-up in 2 months to recheck kidneys  In 5 months id like to recheck urine studies. Diabetes can impact your kidneys. You are on jardiance which helps prevent this. If your levels are still in 5 months then we may switch blood pressure medications to help protect kidneys.  I have ordered the following medication/changed the following medications:   Stop the following medications: Medications Discontinued During This Encounter  Medication Reason   HYDROcodone-acetaminophen (NORCO/VICODIN) 5-325 MG tablet      Start the following medications: No orders of the defined types were placed in this encounter.    Follow up: 2 months to recheck kidney labs  Remember: bring your cuff at next appointment  We look forward to seeing you next time. Please call our clinic at [phone] if you have any questions or concerns. The best time to call is Monday-Friday from 9am-4pm, but there is someone available 24/7. If after hours or the weekend, call the main hospital number and ask for the Internal Medicine Resident On-Call. If you need medication refills, please notify your pharmacy one week in advance and they will send [place] a request.   Thank you for trusting me with your care. Wishing you the best!   Rudene Christians, DO [facility]

## 2023-08-13 ENCOUNTER — Encounter: Payer: Self-pay | Admitting: Internal Medicine

## 2023-08-13 DIAGNOSIS — N183 Chronic kidney disease, stage 3 unspecified: Secondary | ICD-10-CM | POA: Insufficient documentation

## 2023-08-13 NOTE — Progress Notes (Signed)
Subjective:  CC: blood pressure concerns  HPI:  [name] is a 67 y.o. male with a past medical history stated below and presents today for elevated blood pressure readings at home. For the last several weeks, he has had blood pressures in 140-150s/ 90s. He is asymptomatic.  Please see problem based assessment and plan for additional details.  Past Medical History:  Diagnosis Date   CKD stage 3a, GFR 45-59 ml/min (HCC)    Diabetes (HCC) 07/19/2023   Hypertension    Prostate cancer metastatic to bone [facility])     Current Outpatient Medications on File Prior to Visit  Medication Sig Dispense Refill   metFORMIN (GLUCOPHAGE) 1000 MG tablet Take 1,000 mg by mouth 2 (two) times daily.     abiraterone acetate (ZYTIGA) 250 MG tablet Take 4 tablets (1,000 mg total) by mouth daily. Take on an empty stomach 1 hour before or 2 hours after a meal 120 tablet 1   amLODipine (NORVASC) 5 MG tablet Take 5 mg by mouth daily.     atorvastatin (LIPITOR) 40 MG tablet Take 40 mg by mouth daily.     bisacodyl 5 MG EC tablet Take 2 tablets (10 mg total) by mouth daily as needed for moderate constipation. 30 tablet 0   carvedilol (COREG) 25 MG tablet Take 25 mg by mouth daily.     empagliflozin (JARDIANCE) 10 MG TABS tablet Take 1 tablet (10 mg total) by mouth daily. 30 tablet 3   latanoprost (XALATAN) 0.005 % ophthalmic solution Place 1 drop into both eyes daily. 2.5 mL 1   Multiple Vitamin (MULTIVITAMIN WITH MINERALS) TABS tablet Take 1 tablet by mouth daily.     multivitamin-lutein (OCUVITE-LUTEIN) CAPS capsule Take 1 capsule by mouth daily.     predniSONE (DELTASONE) 5 MG tablet Take 1 tablet (5 mg total) by mouth daily with breakfast. 90 tablet 1   No current facility-administered medications on file prior to visit.   Social History   Socioeconomic History   Marital status: Married    Spouse name: Not on file   Number of children: 4   Years of education: Not on file   Highest education  level: Not on file  Occupational History   Not on file  Tobacco Use   Smoking status: Never   Smokeless tobacco: Not on file  Substance and Sexual Activity   Alcohol use: Yes    Alcohol/week: 1.0 standard drink of alcohol    Types: 1 Glasses of wine per week    Comment: once a month   Drug use: No   Sexual activity: Not on file  Other Topics Concern   Not on file  Social History Narrative   Not on file   Social Determinants of Health   Financial Resource Strain: High Risk (04/11/2023)   Overall Financial Resource Strain (CARDIA)    Difficulty of Paying Living Expenses: Very hard  Food Insecurity: No Food Insecurity (05/12/2023)   Hunger Vital Sign    Worried About Running Out of Food in the Last Year: Never true    Ran Out of Food in the Last Year: Never true  Transportation Needs: No Transportation Needs (05/12/2023)   PRAPARE - Administrator, Civil Service (Medical): No    Lack of Transportation (Non-Medical): No  Physical Activity: Not on file  Stress: Not on file  Social Connections: Not on file  Intimate Partner Violence: Not At Risk (05/12/2023)   Humiliation, Afraid, Rape, and Kick questionnaire  Fear of Current or Ex-Partner: No    Emotionally Abused: No    Physically Abused: No    Sexually Abused: No    Review of Systems: ROS negative except for what is noted on the assessment and plan.  Objective:   Vitals:   08/12/23 0913  BP: 129/61  Pulse: 67  Temp: 98 F (36.7 C)  TempSrc: Oral  SpO2: 100%  Weight: 205 lb 6.4 oz (93.2 kg)  Height: 5\' 9"  (1.753 m)    Physical Exam: Constitutional: well-appearing Cardiovascular: regular rate and rhythm, no m/r/g Pulmonary/Chest: normal work of breathing on room air, lungs clear to auscultation bilaterally Abdominal: soft, non-tender, non-distended MSK: normal bulk and tone Skin: warm and dry  Assessment & Plan:  CKD (chronic kidney disease) stage 3, GFR 30-59 ml/min (HCC) Patient with history  of obstructive AKI in 6/24, found to have prostate mass. He was diagnosed with metastatic prostate cancer and underwent TURP. Now his continues to pass urine well. Creatinine levels have remained elevated with GFR improving from June but remains <60. Previously he took losartan and spironolactone for blood pressure control but was switched to amlodipine and carvedilol in setting of AKI last summer. On review, microalbumin was elevated at 284 and he was started on SGLT2. P: Follow-up in 2 months to repeat BMP for renal function, would consider switching back to ARB in future for renal protection of microalbumin remains elevated   Diabetes (HCC) Last A1c at 8.1 at beginning of Oct. Microalbumin was elevated at 284 and he was started on SGLT2. He is tolerating medication well. -continue metformin 1000 mg BID and empaglifozin 10 mg F/u in 2 month to repeat a1c  Essential hypertension, benign Blood pressures at home have been elevated for last several weeks. In office he is at 129/61. He is adherent with amlodipine 5 mg and carvedilol 25 mg. P: Next week nurse only visit to check BP and he will bring in home bp cuff to calibrate If home BP cuff appears accurate will plan to call to review full list of Home Bps as he did not have with him. Would consider adding back low does ARB if appears Bps at home are consistently high and cuff is accurate.   Patient discussed with Dr. Precious Bard Gyanna Jarema, D.O. [facility]  PGY-3 Pager: [phone]  Phone: [phone] Date 08/13/2023  Time 8:01 PM

## 2023-08-13 NOTE — Assessment & Plan Note (Signed)
Last A1c at 8.1 at beginning of Oct. Microalbumin was elevated at 284 and he was started on SGLT2. He is tolerating medication well. -continue metformin 1000 mg BID and empaglifozin 10 mg F/u in 2 month to repeat a1c

## 2023-08-13 NOTE — Assessment & Plan Note (Signed)
Patient with history of obstructive AKI in 6/24, found to have prostate mass. He was diagnosed with metastatic prostate cancer and underwent TURP. Now his continues to pass urine well. Creatinine levels have remained elevated with GFR improving from June but remains <60. Previously he took losartan and spironolactone for blood pressure control but was switched to amlodipine and carvedilol in setting of AKI last summer. On review, microalbumin was elevated at 284 and he was started on SGLT2. P: Follow-up in 2 months to repeat BMP for renal function, would consider switching back to ARB in future for renal protection of microalbumin remains elevated

## 2023-08-13 NOTE — Assessment & Plan Note (Signed)
Blood pressures at home have been elevated for last several weeks. In office he is at 129/61. He is adherent with amlodipine 5 mg and carvedilol 25 mg. P: Next week nurse only visit to check BP and he will bring in home bp cuff to calibrate If home BP cuff appears accurate will plan to call to review full list of Home Bps as he did not have with him. Would consider adding back low does ARB if appears Bps at home are consistently high and cuff is accurate.

## 2023-08-19 NOTE — Progress Notes (Signed)
Internal Medicine Clinic Attending  Case discussed with the resident at the time of the visit.  We reviewed the resident's history and exam and pertinent patient test results.  I agree with the assessment, diagnosis, and plan of care documented in the resident's note.  

## 2023-08-19 NOTE — Addendum Note (Signed)
Addended by: Dickie La on: 08/19/2023 03:02 PM   Modules accepted: Level of Service

## 2023-08-25 NOTE — Progress Notes (Signed)
RN received letter from the Disability Determination Services requesting for call on behalf of patient to request additional information.    RN placed called, additional information is no longer needed.

## 2023-08-26 DIAGNOSIS — H2513 Age-related nuclear cataract, bilateral: Secondary | ICD-10-CM | POA: Diagnosis not present

## 2023-08-26 DIAGNOSIS — H401132 Primary open-angle glaucoma, bilateral, moderate stage: Secondary | ICD-10-CM | POA: Diagnosis not present

## 2023-08-31 DIAGNOSIS — H401112 Primary open-angle glaucoma, right eye, moderate stage: Secondary | ICD-10-CM | POA: Diagnosis not present

## 2023-08-31 DIAGNOSIS — H25812 Combined forms of age-related cataract, left eye: Secondary | ICD-10-CM | POA: Diagnosis not present

## 2023-08-31 DIAGNOSIS — Z01818 Encounter for other preprocedural examination: Secondary | ICD-10-CM | POA: Diagnosis not present

## 2023-08-31 DIAGNOSIS — H401132 Primary open-angle glaucoma, bilateral, moderate stage: Secondary | ICD-10-CM | POA: Diagnosis not present

## 2023-08-31 DIAGNOSIS — H25813 Combined forms of age-related cataract, bilateral: Secondary | ICD-10-CM | POA: Diagnosis not present

## 2023-08-31 DIAGNOSIS — H401122 Primary open-angle glaucoma, left eye, moderate stage: Secondary | ICD-10-CM | POA: Diagnosis not present

## 2023-09-04 NOTE — Assessment & Plan Note (Signed)
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 Bone scan and spinal MRI showed diffuse bone mets, PSA 524. Bone biopsy confirmed metastatic prostate cancer.  -I reviewed the incurable nature of his disease and treatment options and need for long treatment -he started ADT in hospital and his back pain has much improved, will continue ADT and change to Eligard on 7/8. He previouly had reservation bout the therapy, after detailed discussion he agreed to start on 7/19.  -His PSMA scan showed multiple bone mets and pelvic node metastasis  -he has started systemic therapy with Zytiga and prednisone in June 2024, he is tolerating well overall.  Will continue. -Will monitor his PSA closely, it dropped to 1.2 in July 2024  -We previously reviewed the incurable nature of his metastatic disease, and the need for long-term treatment.

## 2023-09-05 ENCOUNTER — Encounter: Payer: Self-pay | Admitting: Hematology

## 2023-09-05 ENCOUNTER — Inpatient Hospital Stay: Payer: 59

## 2023-09-05 ENCOUNTER — Inpatient Hospital Stay (HOSPITAL_BASED_OUTPATIENT_CLINIC_OR_DEPARTMENT_OTHER): Payer: 59 | Admitting: Hematology

## 2023-09-05 ENCOUNTER — Inpatient Hospital Stay: Payer: 59 | Attending: Hematology

## 2023-09-05 VITALS — BP 135/72 | HR 64 | Temp 97.3°F | Resp 17 | Wt 210.5 lb

## 2023-09-05 DIAGNOSIS — N1831 Chronic kidney disease, stage 3a: Secondary | ICD-10-CM | POA: Diagnosis not present

## 2023-09-05 DIAGNOSIS — C7951 Secondary malignant neoplasm of bone: Secondary | ICD-10-CM

## 2023-09-05 DIAGNOSIS — Z7952 Long term (current) use of systemic steroids: Secondary | ICD-10-CM | POA: Diagnosis not present

## 2023-09-05 DIAGNOSIS — C61 Malignant neoplasm of prostate: Secondary | ICD-10-CM

## 2023-09-05 DIAGNOSIS — E1122 Type 2 diabetes mellitus with diabetic chronic kidney disease: Secondary | ICD-10-CM | POA: Insufficient documentation

## 2023-09-05 DIAGNOSIS — Z9079 Acquired absence of other genital organ(s): Secondary | ICD-10-CM | POA: Insufficient documentation

## 2023-09-05 DIAGNOSIS — Z7984 Long term (current) use of oral hypoglycemic drugs: Secondary | ICD-10-CM | POA: Insufficient documentation

## 2023-09-05 DIAGNOSIS — Z79899 Other long term (current) drug therapy: Secondary | ICD-10-CM | POA: Insufficient documentation

## 2023-09-05 DIAGNOSIS — C775 Secondary and unspecified malignant neoplasm of intrapelvic lymph nodes: Secondary | ICD-10-CM | POA: Diagnosis not present

## 2023-09-05 DIAGNOSIS — D509 Iron deficiency anemia, unspecified: Secondary | ICD-10-CM

## 2023-09-05 DIAGNOSIS — I129 Hypertensive chronic kidney disease with stage 1 through stage 4 chronic kidney disease, or unspecified chronic kidney disease: Secondary | ICD-10-CM | POA: Diagnosis not present

## 2023-09-05 LAB — CBC WITH DIFFERENTIAL/PLATELET
Abs Immature Granulocytes: 0.02 10*3/uL (ref 0.00–0.07)
Basophils Absolute: 0 10*3/uL (ref 0.0–0.1)
Basophils Relative: 1 %
Eosinophils Absolute: 0.1 10*3/uL (ref 0.0–0.5)
Eosinophils Relative: 2 %
HCT: 33.7 % — ABNORMAL LOW (ref 39.0–52.0)
Hemoglobin: 10.9 g/dL — ABNORMAL LOW (ref 13.0–17.0)
Immature Granulocytes: 0 %
Lymphocytes Relative: 27 %
Lymphs Abs: 1.5 10*3/uL (ref 0.7–4.0)
MCH: 24 pg — ABNORMAL LOW (ref 26.0–34.0)
MCHC: 32.3 g/dL (ref 30.0–36.0)
MCV: 74.1 fL — ABNORMAL LOW (ref 80.0–100.0)
Monocytes Absolute: 0.3 10*3/uL (ref 0.1–1.0)
Monocytes Relative: 6 %
Neutro Abs: 3.5 10*3/uL (ref 1.7–7.7)
Neutrophils Relative %: 64 %
Platelets: 187 10*3/uL (ref 150–400)
RBC: 4.55 MIL/uL (ref 4.22–5.81)
RDW: 14.6 % (ref 11.5–15.5)
WBC: 5.4 10*3/uL (ref 4.0–10.5)
nRBC: 0 % (ref 0.0–0.2)

## 2023-09-05 LAB — COMPREHENSIVE METABOLIC PANEL
ALT: 11 U/L (ref 0–44)
AST: 12 U/L — ABNORMAL LOW (ref 15–41)
Albumin: 4.6 g/dL (ref 3.5–5.0)
Alkaline Phosphatase: 66 U/L (ref 38–126)
Anion gap: 7 (ref 5–15)
BUN: 28 mg/dL — ABNORMAL HIGH (ref 8–23)
CO2: 24 mmol/L (ref 22–32)
Calcium: 10.2 mg/dL (ref 8.9–10.3)
Chloride: 104 mmol/L (ref 98–111)
Creatinine, Ser: 2.37 mg/dL — ABNORMAL HIGH (ref 0.61–1.24)
GFR, Estimated: 30 mL/min — ABNORMAL LOW (ref >60)
Glucose, Bld: 167 mg/dL — ABNORMAL HIGH (ref 70–99)
Potassium: 5.7 mmol/L — ABNORMAL HIGH (ref 3.5–5.1)
Sodium: 135 mmol/L (ref 135–145)
Total Bilirubin: 0.5 mg/dL (ref <1.2)
Total Protein: 8.3 g/dL — ABNORMAL HIGH (ref 6.5–8.1)

## 2023-09-05 LAB — FERRITIN: Ferritin: 147 ng/mL (ref 24–336)

## 2023-09-05 MED ORDER — ABIRATERONE ACETATE 250 MG PO TABS: 1000.0000 mg | ORAL_TABLET | Freq: Every day | ORAL | 2 refills | Status: DC

## 2023-09-05 NOTE — Progress Notes (Signed)
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 Bone scan and spinal MRI showed diffuse bone mets, PSA 524. Bone biopsy confirmed metastatic prostate cancer.  -I reviewed the incurable nature of his disease and treatment options and need for long treatment -he started ADT in hospital and his back pain has much improved, will continue ADT and change to Eligard on 7/8. He previouly had reservation bout the therapy, after detailed discussion he agreed to start on 7/19.  -His PSMA scan showed multiple bone mets and pelvic node metastasis  -he has started systemic therapy with Zytiga and prednisone in June 2024, he is tolerating well overall.  Will continue. -Will monitor his PSA closely, it dropped to 1.2 in July 2024  -We previously reviewed the incurable nature of his metastatic disease, and the need for long-term treatment.     Assessment and Plan    Metastatic Prostate Cancer Follow-up for metastatic prostate cancer. Currently on Zytiga (abiraterone) and prednisone. PSA levels are undetectable, indicating well-controlled cancer. Discussed Zytiga's mechanism in lowering testosterone to inhibit cancer cell growth. Reports good energy levels but decreased libido, a known side effect. No new symptoms or medication changes. Discussed high cost of Zytiga and potential copay changes with insurance. - Refill  Zytiga - Continue prednisone - Monitor PSA levels - Discuss potential copay changes with insurance - Encourage contacting insurance for drug assistance if needed  Bone metastasis Requires bone-strengthening medication due to cancer in the bones, which can cause fractures. Dental clearance needed before starting bone medication to prevent complications such as jaw necrosis. Explained benefits of bone medication in preventing fractures and risks of dental complications without clearance. - Cancel Xgeva injection until dental clearance is obtained - Recommend calcium and vitamin D supplementation - Encourage finding a dentist for clearance - Provide information on Medicaid dental providers  CKD with worsening renal function -He presented with acute renal failure when he was diagnosed with metastatic prostate cancer, resolved after cancer treatment and foley placement which was subsequently removed  -his creatinine has been trending up, was 0.93 in June 2024, now 2.37 today -Will obtain bilateral renal ultrasound to rule out hydronephrosis again  General Health Maintenance Has not seen a dentist in over 15 years. Discussed importance of dental health, especially before starting bone-strengthening medication. Explained that Medicare does not cover dental, but Medicaid might. Advised to call insurance to find an Youth worker. - Encourage finding a Education officer, community and getting a dental cleaning - Advise calling insurance to find an in-network dentist  Follow-up -He is doing well overall, will continue current therapy -Will cancel Xgeva injection today due to the lack of dental clearance -He will call his insurance to find a dentist - schedule follow-up appointment on November 06, 2023, with lab and injections        SUMMARY OF ONCOLOGIC HISTORY: Oncology History Overview Note   Cancer Staging  Prostate cancer metastatic to bone [facility]) Staging  form: Prostate, AJCC 8th Edition - Clinical  stage from 03/28/2023: Stage IVB (cTX, cN0, pM1, PSA: 524) - Signed by [name], MD on 04/08/2023 Prostate specific antigen (PSA) range: 20 or greater     Prostate cancer metastatic to bone (HCC)  03/24/2023 Imaging   NM BONE SCAN WHOLE BODY   IMPRESSION: Multifocal osseous metastatic disease. Areas include numerous bilateral ribs, sternum, scapula, spine, pelvis and proximal femurs     03/28/2023 Cancer Staging   Staging form: Prostate, AJCC 8th Edition - Clinical stage from 03/28/2023: Stage IVB (cTX, cN0, pM1, PSA: 524) - Signed by [name], MD on 04/08/2023 Prostate specific antigen (PSA) range: 20 or greater   03/28/2023 Pathology Results     FINAL MICROSCOPIC DIAGNOSIS:  A. RIGHT ILIAC, BONE BIOPSY: Metastatic poorly differentiated prostatic adenocarcinoma   04/08/2023 Initial Diagnosis   Prostate cancer metastatic to bone [facility])      Discussed the use of AI scribe software for clinical note transcription with the patient, who gave verbal consent to proceed.  History of Present Illness   The patient, a 67 year old male with metastatic prostate cancer, presents for a routine follow-up. He reports no new symptoms or changes in his condition. He is currently on Zytiga, which he obtains from a specialty pharmacy. He expresses concern about the cost of the medication, but notes that he has not had to pay any copay recently due to changes in his insurance. He is unsure if this will continue in the future.  The patient reports a decrease in sexual desire, which he attributes to his cancer treatment. He has discussed this with his urologist previously and understands that it is a side effect of the testosterone-blocking action of his medication.  The patient also mentions that he has not been very active recently, choosing to work only when he feels strong. He has started collecting social security, which he uses to pay his mortgage.  The patient has not seen a dentist in over  fifteen years. He has dental coverage through his insurance, but has not yet found a dentist who will accept it. He expresses concern about potential dental problems related to a new injection treatment that the doctor is considering to strengthen his bones.         All other systems were reviewed with the patient and are negative.  MEDICAL HISTORY:  Past Medical History:  Diagnosis Date   CKD stage 3a, GFR 45-59 ml/min (HCC)    Diabetes (HCC) 07/19/2023   Hypertension    Prostate cancer metastatic to bone [facility])     SURGICAL HISTORY: Past Surgical History:  Procedure Laterality Date   COLONOSCOPY  2024   CYSTOSCOPY N/A 05/12/2023   Procedure: CYSTOSCOPY;  Surgeon: Heloise Purpura, MD;  Location: [facility];  Service: Urology;  Laterality: N/A;   NO PAST SURGERIES     TRANSURETHRAL RESECTION OF PROSTATE N/A 05/12/2023   Procedure: TRANSURETHRAL RESECTION OF THE PROSTATE (TURP);  Surgeon: Heloise Purpura, MD;  Location: [facility];  Service: Urology;  Laterality: N/A;  90 MINUTES NEEDED FOR CASE    I have reviewed the social history and family history with the patient and they are unchanged from previous note.  ALLERGIES:  has No Known Allergies.  MEDICATIONS:  Current Outpatient Medications  Medication Sig Dispense Refill   abiraterone acetate (ZYTIGA) 250 MG tablet Take 4 tablets (1,000 mg total) by mouth daily. Take on an empty stomach 1 hour before or 2 hours after a meal 120 tablet 2  amLODipine (NORVASC) 5 MG tablet Take 5 mg by mouth daily.     atorvastatin (LIPITOR) 40 MG tablet Take 40 mg by mouth daily.     bisacodyl 5 MG EC tablet Take 2 tablets (10 mg total) by mouth daily as needed for moderate constipation. 30 tablet 0   carvedilol (COREG) 25 MG tablet Take 25 mg by mouth daily.     empagliflozin (JARDIANCE) 10 MG TABS tablet Take 1 tablet (10 mg total) by mouth daily. 30 tablet 3   latanoprost (XALATAN) 0.005 % ophthalmic solution Place 1 drop into both eyes daily. 2.5 mL 1    metFORMIN (GLUCOPHAGE) 1000 MG tablet Take 1,000 mg by mouth 2 (two) times daily.     Multiple Vitamin (MULTIVITAMIN WITH MINERALS) TABS tablet Take 1 tablet by mouth daily.     multivitamin-lutein (OCUVITE-LUTEIN) CAPS capsule Take 1 capsule by mouth daily.     predniSONE (DELTASONE) 5 MG tablet Take 1 tablet (5 mg total) by mouth daily with breakfast. 90 tablet 1   No current facility-administered medications for this visit.    PHYSICAL EXAMINATION: ECOG PERFORMANCE STATUS: 1 - Symptomatic but completely ambulatory  Vitals:   09/05/23 1316 09/05/23 1317  BP: (!) 141/71 135/72  Pulse: 64   Resp: 17   Temp: (!) 97.3 F (36.3 C)   SpO2: 99%    Wt Readings from Last 3 Encounters:  09/05/23 210 lb 8 oz (95.5 kg)  08/12/23 205 lb 6.4 oz (93.2 kg)  07/19/23 206 lb 14.4 oz (93.8 kg)     GENERAL:alert, no distress and comfortable SKIN: skin color, texture, turgor are normal, no rashes or significant lesions EYES: normal, Conjunctiva are pink and non-injected, sclera clear Musculoskeletal:no cyanosis of digits and no clubbing  NEURO: alert & oriented x 3 with fluent speech, no focal motor/sensory deficits     LABORATORY DATA:  I have reviewed the data as listed    Latest Ref Rng & Units 09/05/2023   12:47 PM 07/11/2023   10:21 AM 05/06/2023    1:45 PM  CBC  WBC 4.0 - 10.5 K/uL 5.4  5.8  7.7   Hemoglobin 13.0 - 17.0 g/dL 40.9  81.1  9.2   Hematocrit 39.0 - 52.0 % 33.7  32.0  27.6   Platelets 150 - 400 K/uL 187  169  139         Latest Ref Rng & Units 09/05/2023   12:47 PM 07/11/2023   10:21 AM 05/12/2023   10:14 AM  CMP  Glucose 70 - 99 mg/dL 914  782  956   BUN 8 - 23 mg/dL 28  24  22    Creatinine 0.61 - 1.24 mg/dL 2.13  0.86  5.78   Sodium 135 - 145 mmol/L 135  134  134   Potassium 3.5 - 5.1 mmol/L 5.7  5.0  4.9   Chloride 98 - 111 mmol/L 104  102  101   CO2 22 - 32 mmol/L 24  24  23    Calcium 8.9 - 10.3 mg/dL 46.9  9.6  9.0   Total Protein 6.5 - 8.1 g/dL 8.3   7.9    Total Bilirubin <1.2 mg/dL 0.5  0.5    Alkaline Phos 38 - 126 U/L 66  97    AST 15 - 41 U/L 12  18    ALT 0 - 44 U/L 11  19        RADIOGRAPHIC STUDIES: I have personally reviewed the radiological images as  listed and agreed with the findings in the report. No results found.    Orders Placed This Encounter  Procedures   US RENAL    Standing Status:   Future    Standing Expiration Date:   09/04/2024    Order Specific Question:   Reason for Exam (SYMPTOM  OR DIAGNOSIS REQUIRED)    Answer:   rule out hydronephorosis    Order Specific Question:   Preferred imaging location?    Answer:   [facility]   All questions were answered. The patient knows to call the clinic with any problems, questions or concerns. No barriers to learning was detected. The total time spent in the appointment was 25 minutes.     [name], MD 09/05/2023

## 2023-09-06 ENCOUNTER — Telehealth: Payer: Self-pay | Admitting: Hematology

## 2023-09-06 ENCOUNTER — Other Ambulatory Visit: Payer: Self-pay | Admitting: Hematology

## 2023-09-06 ENCOUNTER — Telehealth: Payer: Self-pay | Admitting: *Deleted

## 2023-09-06 ENCOUNTER — Encounter: Payer: Self-pay | Admitting: *Deleted

## 2023-09-06 DIAGNOSIS — C7951 Secondary malignant neoplasm of bone: Secondary | ICD-10-CM

## 2023-09-06 DIAGNOSIS — N183 Chronic kidney disease, stage 3 unspecified: Secondary | ICD-10-CM

## 2023-09-06 LAB — PROSTATE-SPECIFIC AG, SERUM (LABCORP): Prostate Specific Ag, Serum: <0.1 ng/mL (ref 0.0–4.0)

## 2023-09-06 NOTE — Telephone Encounter (Signed)
Order for US Renal, no PA required.    Scheduled.  Patient is aware to arrive with full bladder.  To start drinking 32 oz of fluid 1 hour prior to [place].    He was also made aware of his increasing Crt levels and Dr Mosetta Putt encouraged him to increase his fluid intake.

## 2023-09-06 NOTE — Telephone Encounter (Signed)
Spoken with patient confirming upcoming appointment

## 2023-09-08 ENCOUNTER — Ambulatory Visit (HOSPITAL_COMMUNITY)
Admission: RE | Admit: 2023-09-08 | Discharge: 2023-09-08 | Disposition: A | Discharge status: Home / Self Care | Payer: 59 | Source: Ambulatory Visit | Attending: Hematology | Admitting: Hematology

## 2023-09-08 DIAGNOSIS — E1122 Type 2 diabetes mellitus with diabetic chronic kidney disease: Secondary | ICD-10-CM | POA: Insufficient documentation

## 2023-09-08 DIAGNOSIS — N183 Chronic kidney disease, stage 3 unspecified: Secondary | ICD-10-CM | POA: Insufficient documentation

## 2023-09-08 DIAGNOSIS — C7951 Secondary malignant neoplasm of bone: Secondary | ICD-10-CM | POA: Insufficient documentation

## 2023-09-08 DIAGNOSIS — C61 Malignant neoplasm of prostate: Secondary | ICD-10-CM | POA: Diagnosis not present

## 2023-09-08 DIAGNOSIS — Z0389 Encounter for observation for other suspected diseases and conditions ruled out: Secondary | ICD-10-CM | POA: Diagnosis not present

## 2023-09-12 ENCOUNTER — Telehealth: Payer: Self-pay

## 2023-09-12 ENCOUNTER — Other Ambulatory Visit: Payer: Self-pay

## 2023-09-12 DIAGNOSIS — H25812 Combined forms of age-related cataract, left eye: Secondary | ICD-10-CM | POA: Diagnosis not present

## 2023-09-12 DIAGNOSIS — H401122 Primary open-angle glaucoma, left eye, moderate stage: Secondary | ICD-10-CM | POA: Diagnosis not present

## 2023-09-12 NOTE — Telephone Encounter (Addendum)
Tried to call patient to relay message below but on answer, left a message to call us back.  ----- Message from [name] sent at 09/12/2023  7:26 AM EST ----- My nurse, please let pt know his [place] of kidney was normal. I recommend nephology referral for his CKD, please place the referral (and let him know their office number) if he agrees, thanks   [name]

## 2023-09-26 DIAGNOSIS — H401112 Primary open-angle glaucoma, right eye, moderate stage: Secondary | ICD-10-CM | POA: Diagnosis not present

## 2023-09-26 DIAGNOSIS — E1139 Type 2 diabetes mellitus with other diabetic ophthalmic complication: Secondary | ICD-10-CM | POA: Diagnosis not present

## 2023-09-26 DIAGNOSIS — E1136 Type 2 diabetes mellitus with diabetic cataract: Secondary | ICD-10-CM | POA: Diagnosis not present

## 2023-09-26 DIAGNOSIS — H25811 Combined forms of age-related cataract, right eye: Secondary | ICD-10-CM | POA: Diagnosis not present

## 2023-10-11 ENCOUNTER — Other Ambulatory Visit: Payer: Self-pay | Admitting: Student

## 2023-10-11 DIAGNOSIS — H539 Unspecified visual disturbance: Secondary | ICD-10-CM

## 2023-10-31 ENCOUNTER — Encounter: Payer: Self-pay | Admitting: Hematology

## 2023-11-03 DIAGNOSIS — H5203 Hypermetropia, bilateral: Secondary | ICD-10-CM | POA: Diagnosis not present

## 2023-11-03 DIAGNOSIS — H52221 Regular astigmatism, right eye: Secondary | ICD-10-CM | POA: Diagnosis not present

## 2023-11-07 ENCOUNTER — Encounter: Payer: Self-pay | Admitting: Hematology

## 2023-11-07 ENCOUNTER — Inpatient Hospital Stay: Payer: Medicare HMO

## 2023-11-07 ENCOUNTER — Inpatient Hospital Stay: Payer: Medicare HMO | Attending: Nurse Practitioner | Admitting: Nurse Practitioner

## 2023-11-07 VITALS — BP 130/73 | HR 68 | Temp 97.1°F | Resp 13 | Wt 211.4 lb

## 2023-11-07 DIAGNOSIS — D509 Iron deficiency anemia, unspecified: Secondary | ICD-10-CM

## 2023-11-07 DIAGNOSIS — Z5111 Encounter for antineoplastic chemotherapy: Secondary | ICD-10-CM | POA: Diagnosis not present

## 2023-11-07 DIAGNOSIS — C61 Malignant neoplasm of prostate: Secondary | ICD-10-CM | POA: Insufficient documentation

## 2023-11-07 DIAGNOSIS — C775 Secondary and unspecified malignant neoplasm of intrapelvic lymph nodes: Secondary | ICD-10-CM | POA: Insufficient documentation

## 2023-11-07 DIAGNOSIS — C7951 Secondary malignant neoplasm of bone: Secondary | ICD-10-CM | POA: Diagnosis not present

## 2023-11-07 LAB — CBC WITH DIFFERENTIAL/PLATELET
Abs Immature Granulocytes: 0.01 10*3/uL (ref 0.00–0.07)
Basophils Absolute: 0 10*3/uL (ref 0.0–0.1)
Basophils Relative: 1 %
Eosinophils Absolute: 0.1 10*3/uL (ref 0.0–0.5)
Eosinophils Relative: 2 %
HCT: 31.6 % — ABNORMAL LOW (ref 39.0–52.0)
Hemoglobin: 10.3 g/dL — ABNORMAL LOW (ref 13.0–17.0)
Immature Granulocytes: 0 %
Lymphocytes Relative: 39 %
Lymphs Abs: 2.4 10*3/uL (ref 0.7–4.0)
MCH: 24.3 pg — ABNORMAL LOW (ref 26.0–34.0)
MCHC: 32.6 g/dL (ref 30.0–36.0)
MCV: 74.5 fL — ABNORMAL LOW (ref 80.0–100.0)
Monocytes Absolute: 0.4 10*3/uL (ref 0.1–1.0)
Monocytes Relative: 7 %
Neutro Abs: 3.2 10*3/uL (ref 1.7–7.7)
Neutrophils Relative %: 51 %
Platelets: 154 10*3/uL (ref 150–400)
RBC: 4.24 MIL/uL (ref 4.22–5.81)
RDW: 15.2 % (ref 11.5–15.5)
WBC: 6.2 10*3/uL (ref 4.0–10.5)
nRBC: 0 % (ref 0.0–0.2)

## 2023-11-07 LAB — COMPREHENSIVE METABOLIC PANEL
ALT: 10 U/L (ref 0–44)
AST: 12 U/L — ABNORMAL LOW (ref 15–41)
Albumin: 4.4 g/dL (ref 3.5–5.0)
Alkaline Phosphatase: 60 U/L (ref 38–126)
Anion gap: 6 (ref 5–15)
BUN: 21 mg/dL (ref 8–23)
CO2: 22 mmol/L (ref 22–32)
Calcium: 10.1 mg/dL (ref 8.9–10.3)
Chloride: 107 mmol/L (ref 98–111)
Creatinine, Ser: 2.26 mg/dL — ABNORMAL HIGH (ref 0.61–1.24)
GFR, Estimated: 31 mL/min — ABNORMAL LOW (ref >60)
Glucose, Bld: 83 mg/dL (ref 70–99)
Potassium: 5.4 mmol/L — ABNORMAL HIGH (ref 3.5–5.1)
Sodium: 135 mmol/L (ref 135–145)
Total Bilirubin: 0.4 mg/dL (ref 0.0–1.2)
Total Protein: 7.7 g/dL (ref 6.5–8.1)

## 2023-11-07 LAB — FERRITIN: Ferritin: 158 ng/mL (ref 24–336)

## 2023-11-07 MED ORDER — LEUPROLIDE ACETATE (6 MONTH) 45 MG ~~LOC~~ KIT
45.0000 mg | PACK | Freq: Once | SUBCUTANEOUS | Status: AC
Start: 2023-11-07 — End: 2023-11-07
  Administered 2023-11-07: 45 mg via SUBCUTANEOUS
  Filled 2023-11-07: qty 45

## 2023-11-07 MED ORDER — DENOSUMAB 120 MG/1.7ML ~~LOC~~ SOLN: 120.0000 mg | Freq: Once | SUBCUTANEOUS | Status: DC

## 2023-11-07 NOTE — Progress Notes (Unsigned)
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 Bone scan and spinal MRI showed diffuse bone mets, PSA 524. Bone biopsy confirmed metastatic prostate cancer.  -The incurable nature of his disease and treatment options were discussed along with the need for long term treatment -he started ADT in hospital and his back pain has much improved, will continue ADT and change to Eligard on 7/8. He previouly had reservation about the therapy, after detailed discussion he agreed to start on 7/19.  -His PSMA scan showed multiple bone mets and pelvic node metastasis  -he has started systemic therapy with Zytiga and prednisone in June 2024, he is tolerating well overall.  Will continue. -Will monitor his PSA closely, it dropped to 1.2 in July 2024  -11/07/2023 - PSA level pending.  --patient asking about alternative treatments available for his prostate cancer. He is unhappy about having to take pills indefinitely. Specifically, he has asked about radiation or surgery as possible alternatives to the pills he is taking.  -We previously reviewed the incurable nature of his metastatic disease, and the need for long-term treatment.  --specifically, we reviewed his PET scan from 04/2023 which did show multifocal bone metastases along with nodal metastases in the left pelvic side wall and left perirectal space. He had whole body bone scan done 03/2023 which showed multifocal osseous metastatic disease in ribs, sternum, scapula, spine, pelvis, and bilateral proximal femurs. He is not a good candidate for radiation, due to  the multifocal quality of his disease. Surgery is also not good option as it would take away the need for eligard injections every 6 months, however, he would still need to take Zytiga ( or similar medication) everyday. -His bone metastases require bone-strengthening medication due to cancer in the bones, which can cause fractures. Dental clearance needed before starting bone medication to prevent complications such as jaw necrosis. Again, explained benefits of bone medication in preventing fractures and risks of dental complications without clearance. -Will cancel Xgeva injection until dental clearance is obtained   Plan:  Labs reviewed  -CBC showing WBC 6.2; Hgb 10.3; Hct 31.6; Plt 154; Anc 3.2 -CMP - K 3.4; glucose 83; BUN 21; Creatinine 2.26; eGFR 31; Ca 10.1; LFTs normal.   -PSA and ferritin both pending Discussed importance of getting dental clearance and begin Xgeeva injections. Explained this medication was to help prevent fractures related to bony metastases from prostate cancer. He voiced understanding and said he would get this clearance.  After discussing his long-term treatment and few alternatives, patient plans to continue with current therapy.  He continues to see urology on routine basis Labs with follow up in 3 months. Xgeeva injection pending dental clearance.   The patient understands the plans discussed today and is in agreement with them.  He knows to contact our office if he develops concerns prior to his next appointment.  I provided 25 minutes of face-to-face time during this encounter and > 50% was spent counseling as documented under my assessment and plan.    Carlean Jews, NP  [facility] - A DEPT OF  [facility]igh[facility]ideg[facility] [address] [place] [address] Dept: [phone] Dept Fax: [phone]   Orders Placed This Encounter  Procedures   CBC with Differential (Cancer Center Only)    Standing  Status:   Future    Expected Date:   02/05/2024    Expiration Date:   11/06/2024   CMP (Cancer Center only)    Standing Status:   Future    Expected Date:   02/05/2024    Expiration Date:   11/06/2024   PSA, total and free    Standing Status:   Future    Expected Date:   02/05/2024    Expiration Date:   11/06/2024   Ferritin    Standing Status:   Future    Expected Date:   02/05/2024    Expiration Date:   11/06/2024      CHIEF COMPLAINT:  CC: metastatic prostate cancer   Current Treatment:  eligard every 6 months. Zytiga 1000 mg daily with prednisone 5 mg daily   INTERVAL HISTORY:  Cyan is here today for repeat clinical assessment. He was last seen by Dr. Mosetta Putt on 09/05/2023. Clinically, has been doing well. ?dental clearance. Was to be seen by dentist and obtain clearance.he has not done this yet, however, plans to have dental consultation prior to his next visit.  Renal functions became moderately elevated at last visit. He did have a renal ultrasound done 09/08/2023 which showed no hydronephrosis. Creatinine has improved, though still moderately elevated today.  He denies chest pain, chest pressure, or shortness of breath. He denies headaches or visual disturbances. He denies abdominal pain, nausea, vomiting, or changes in bowel or bladder habits. He does have concern regarding erectile dysfunction. He is also inquiring about other treatments in hoes he will not have to take medication for the rest of his life.  He denies fevers or chills. He denies pain. His appetite is good. His weight has been stable.  I have reviewed the past medical history, past surgical history, social history and family history with the patient and they are unchanged from previous note.  ALLERGIES:  has no known allergies.  MEDICATIONS:  Current Outpatient Medications  Medication Sig Dispense Refill   abiraterone acetate (ZYTIGA) 250 MG tablet Take 4 tablets (1,000 mg total) by mouth daily. Take on an empty  stomach 1 hour before or 2 hours after a meal 120 tablet 2   amLODipine (NORVASC) 5 MG tablet Take 5 mg by mouth daily.     atorvastatin (LIPITOR) 40 MG tablet Take 40 mg by mouth daily.     bisacodyl 5 MG EC tablet Take 2 tablets (10 mg total) by mouth daily as needed for moderate constipation. 30 tablet 0   carvedilol (COREG) 25 MG tablet Take 25 mg by mouth daily.     empagliflozin (JARDIANCE) 10 MG TABS tablet Take 1 tablet (10 mg total) by mouth daily. 30 tablet 3   latanoprost (XALATAN) 0.005 % ophthalmic solution INSTILL 1 DROP INTO EACH EYE ONCE DAILY 3 mL 0   metFORMIN (GLUCOPHAGE) 1000 MG tablet Take 1,000 mg by mouth 2 (two) times daily.     Multiple Vitamin (MULTIVITAMIN WITH MINERALS) TABS tablet Take 1 tablet by mouth daily.     multivitamin-lutein (OCUVITE-LUTEIN) CAPS capsule Take 1 capsule by mouth daily.     predniSONE (DELTASONE) 5 MG tablet Take 1 tablet (5 mg total) by mouth daily with breakfast. 90 tablet 1   No current facility-administered medications for this  visit.    HISTORY OF PRESENT ILLNESS:   Oncology History Overview Note   Cancer Staging  Prostate cancer metastatic to bone [facility]) Staging form: Prostate, AJCC 8th Edition - Clinical stage from 03/28/2023: Stage IVB (cTX, cN0, pM1, PSA: 524) - Signed by [name], MD on 04/08/2023 Prostate specific antigen (PSA) range: 20 or greater     Prostate cancer metastatic to bone (HCC)  03/24/2023 Imaging   NM BONE SCAN WHOLE BODY   IMPRESSION: Multifocal osseous metastatic disease. Areas include numerous bilateral ribs, sternum, scapula, spine, pelvis and proximal femurs     03/28/2023 Cancer Staging   Staging form: Prostate, AJCC 8th Edition - Clinical stage from 03/28/2023: Stage IVB (cTX, cN0, pM1, PSA: 524) - Signed by [name], MD on 04/08/2023 Prostate specific antigen (PSA) range: 20 or greater   03/28/2023 Pathology Results     FINAL MICROSCOPIC DIAGNOSIS:  A. RIGHT ILIAC, BONE BIOPSY: Metastatic  poorly differentiated prostatic adenocarcinoma   04/08/2023 Initial Diagnosis   Prostate cancer metastatic to bone [facility])       REVIEW OF SYSTEMS:   Constitutional: Denies fevers, chills or abnormal weight loss Eyes: Denies blurriness of vision Ears, nose, mouth, throat, and face: Denies mucositis or sore throat Respiratory: Denies cough, dyspnea or wheezes Cardiovascular: Denies palpitation, chest discomfort or lower extremity swelling Gastrointestinal:  Denies nausea, heartburn or change in bowel habits Skin: Denies abnormal skin rashes Lymphatics: Denies new lymphadenopathy or easy bruising Neurological:Denies numbness, tingling or new weaknesses Behavioral/Psych: Mood is stable, no new changes  All other systems were reviewed with the patient and are negative.   VITALS:   Today's Vitals   11/07/23 1315 11/07/23 1316  BP: 130/73   Pulse: 68   Resp: 13   Temp: (!) 97.1 F (36.2 C)   SpO2: 99%   Weight: 211 lb 6.4 oz (95.9 kg)   PainSc:  0-No pain   Body mass index is 31.22 kg/m.   Wt Readings from Last 3 Encounters:  11/07/23 211 lb 6.4 oz (95.9 kg)  09/05/23 210 lb 8 oz (95.5 kg)  08/12/23 205 lb 6.4 oz (93.2 kg)    Body mass index is 31.22 kg/m.  Performance status (ECOG): 1 - Symptomatic but completely ambulatory  PHYSICAL EXAM:   GENERAL:alert, no distress and comfortable SKIN: skin color, texture, turgor are normal, no rashes or significant lesions EYES: normal, Conjunctiva are pink and non-injected, sclera clear OROPHARYNX:no exudate, no erythema and lips, buccal mucosa, and tongue normal  NECK: supple, thyroid normal size, non-tender, without nodularity LYMPH:  no palpable lymphadenopathy in the cervical, axillary or inguinal LUNGS: clear to auscultation and percussion with normal breathing effort HEART: regular rate & rhythm and no murmurs and no lower extremity edema ABDOMEN:abdomen soft, non-tender and normal bowel sounds Musculoskeletal:no  cyanosis of digits and no clubbing  NEURO: alert & oriented x 3 with fluent speech, no focal motor/sensory deficits  LABORATORY DATA:  I have reviewed the data as listed    Component Value Date/Time   NA 135 11/07/2023 1226   NA 140 04/04/2023 1042   K 5.4 (H) 11/07/2023 1226   CL 107 11/07/2023 1226   CO2 22 11/07/2023 1226   GLUCOSE 83 11/07/2023 1226   BUN 21 11/07/2023 1226   BUN 12 04/04/2023 1042   CREATININE 2.26 (H) 11/07/2023 1226   CALCIUM 10.1 11/07/2023 1226   PROT 7.7 11/07/2023 1226   ALBUMIN 4.4 11/07/2023 1226   AST 12 (L) 11/07/2023 1226   ALT  10 11/07/2023 1226   ALKPHOS 60 11/07/2023 1226   BILITOT 0.4 11/07/2023 1226   GFRNONAA 31 (L) 11/07/2023 1226   GFRAA >90 06/27/2013 1212    Lab Results  Component Value Date   WBC 6.2 11/07/2023   NEUTROABS 3.2 11/07/2023   HGB 10.3 (L) 11/07/2023   HCT 31.6 (L) 11/07/2023   MCV 74.5 (L) 11/07/2023   PLT 154 11/07/2023

## 2023-11-07 NOTE — Progress Notes (Signed)
 Marland Kitchen

## 2023-11-07 NOTE — Assessment & Plan Note (Addendum)
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 Bone scan and spinal MRI showed diffuse bone mets, PSA 524. Bone biopsy confirmed metastatic prostate cancer.  -The incurable nature of his disease and treatment options were discussed along with the need for long term treatment -he started ADT in hospital and his back pain has much improved, will continue ADT and change to Eligard on 7/8. He previouly had reservation about the therapy, after detailed discussion he agreed to start on 7/19.  -His PSMA scan showed multiple bone mets and pelvic node metastasis  -he has started systemic therapy with Zytiga and prednisone in June 2024, he is tolerating well overall.  Will continue. -Will monitor his PSA closely, it dropped to 1.2 in July 2024  -11/07/2023 - PSA level pending.  --patient asking about alternative treatments available for his prostate cancer. He is unhappy about having to take pills indefinitely. Specifically, he has asked about radiation or surgery as possible alternatives to the pills he is taking.  -We previously reviewed the incurable nature of his metastatic disease, and the need for long-term treatment.  --specifically, we reviewed his PET scan from 04/2023 which did show multifocal bone metastases along with nodal metastases in the left pelvic side wall and left perirectal space. He had whole body bone scan done 03/2023 which showed multifocal osseous metastatic disease in ribs, sternum, scapula, spine, pelvis, and bilateral proximal femurs. He is not a good candidate for radiation, due to the multifocal quality of his disease. Surgery is also not good option as it would take away the need for eligard injections every 6 months, however, he would still need to take Zytiga ( or similar medication) everyday. -His bone metastases require bone-strengthening medication due to cancer in the bones, which can cause fractures. Dental  clearance needed before starting bone medication to prevent complications such as jaw necrosis. Again, explained benefits of bone medication in preventing fractures and risks of dental complications without clearance. -Will cancel Xgeva injection until dental clearance is obtained

## 2023-11-08 ENCOUNTER — Encounter: Payer: Self-pay | Admitting: Nurse Practitioner

## 2023-11-08 ENCOUNTER — Encounter: Payer: Self-pay | Admitting: Hematology

## 2023-11-08 LAB — PROSTATE-SPECIFIC AG, SERUM (LABCORP): Prostate Specific Ag, Serum: <0.1 ng/mL (ref 0.0–4.0)

## 2023-11-13 ENCOUNTER — Other Ambulatory Visit: Payer: Self-pay | Admitting: Student

## 2023-11-19 ENCOUNTER — Other Ambulatory Visit: Payer: Self-pay | Admitting: Student

## 2023-11-19 DIAGNOSIS — H539 Unspecified visual disturbance: Secondary | ICD-10-CM

## 2023-11-28 ENCOUNTER — Other Ambulatory Visit: Payer: Self-pay | Admitting: Hematology

## 2023-11-28 DIAGNOSIS — C61 Malignant neoplasm of prostate: Secondary | ICD-10-CM

## 2023-11-28 DIAGNOSIS — C7951 Secondary malignant neoplasm of bone: Secondary | ICD-10-CM

## 2023-12-26 ENCOUNTER — Encounter: Payer: Self-pay | Admitting: Hematology

## 2023-12-29 ENCOUNTER — Other Ambulatory Visit: Payer: Self-pay | Admitting: Student

## 2023-12-29 DIAGNOSIS — H539 Unspecified visual disturbance: Secondary | ICD-10-CM

## 2023-12-30 NOTE — Telephone Encounter (Signed)
 Medication sent to pharmacy

## 2024-01-10 DIAGNOSIS — H401132 Primary open-angle glaucoma, bilateral, moderate stage: Secondary | ICD-10-CM | POA: Diagnosis not present

## 2024-01-17 ENCOUNTER — Encounter: Payer: Self-pay | Admitting: Hematology

## 2024-01-24 ENCOUNTER — Ambulatory Visit: Admitting: Student

## 2024-01-24 ENCOUNTER — Other Ambulatory Visit (HOSPITAL_COMMUNITY): Payer: Self-pay

## 2024-01-24 ENCOUNTER — Encounter: Payer: Self-pay | Admitting: Hematology

## 2024-01-24 VITALS — BP 118/56 | HR 64 | Temp 98.4°F | Ht 69.0 in | Wt 220.3 lb

## 2024-01-24 DIAGNOSIS — N183 Chronic kidney disease, stage 3 unspecified: Secondary | ICD-10-CM

## 2024-01-24 DIAGNOSIS — E1122 Type 2 diabetes mellitus with diabetic chronic kidney disease: Secondary | ICD-10-CM

## 2024-01-24 DIAGNOSIS — I10 Essential (primary) hypertension: Secondary | ICD-10-CM

## 2024-01-24 DIAGNOSIS — R809 Proteinuria, unspecified: Secondary | ICD-10-CM

## 2024-01-24 DIAGNOSIS — Z7984 Long term (current) use of oral hypoglycemic drugs: Secondary | ICD-10-CM

## 2024-01-24 DIAGNOSIS — I130 Hypertensive heart and chronic kidney disease with heart failure and stage 1 through stage 4 chronic kidney disease, or unspecified chronic kidney disease: Secondary | ICD-10-CM

## 2024-01-24 DIAGNOSIS — I5020 Unspecified systolic (congestive) heart failure: Secondary | ICD-10-CM | POA: Diagnosis not present

## 2024-01-24 DIAGNOSIS — Z23 Encounter for immunization: Secondary | ICD-10-CM | POA: Diagnosis not present

## 2024-01-24 DIAGNOSIS — N1831 Chronic kidney disease, stage 3a: Secondary | ICD-10-CM

## 2024-01-24 DIAGNOSIS — E1129 Type 2 diabetes mellitus with other diabetic kidney complication: Secondary | ICD-10-CM

## 2024-01-24 LAB — BRAIN NATRIURETIC PEPTIDE: B Natriuretic Peptide: 51.9 pg/mL (ref 0.0–100.0)

## 2024-01-24 LAB — POCT GLYCOSYLATED HEMOGLOBIN (HGB A1C): Hemoglobin A1C: 7.7 % — AB (ref 4.0–5.6)

## 2024-01-24 LAB — GLUCOSE, CAPILLARY: Glucose-Capillary: 105 mg/dL — ABNORMAL HIGH (ref 70–99)

## 2024-01-24 NOTE — Assessment & Plan Note (Addendum)
 Lab Results  Component Value Date   HGBA1C 7.7 (A) 01/24/2024   Last Microalbumin was elevated at 284 [07/19/2023], was started on Jardiance. Reports that he is not taking this medication because it is too expensive. He takes metformin 1000 mg BID. Reports that he has eating a lot of yams lately.  Is up-to-date on ophthalmology exam.  I have reached out to pharmacy assistance for Jardiance, she reported that the medications are $47 per month.  And had recommended the patient call social security at [phone] to enroll for Extra Help/Low Income Subsidy.   -Follow-up GFR, if less than <30, will need to stop metformin.

## 2024-01-24 NOTE — Progress Notes (Signed)
 Established Patient Office Visit  Subjective   Patient ID: Carlos Martin, male    DOB: 03-Sep-1956  Age: 68 y.o. MRN: [id number]  Chief Complaint  Patient presents with   Pneumonia   Medication Refill    Requesting all meds to be refilled with a 3 month supply he is traveling to [place]     HPI  This is a 69 year old make living with a history stated below and presents today for medication refill. Please see problem based assessment and plan for additional details.    Patient Active Problem List   Diagnosis Date Noted   Pneumococcal vaccine administered 01/24/2024   HFrEF (heart failure with reduced ejection fraction) (HCC) 01/24/2024   CKD (chronic kidney disease) stage 3, GFR 30-59 ml/min (HCC) 08/13/2023   Healthcare maintenance 07/19/2023   Diabetes (HCC) 07/19/2023   Urinary retention 05/12/2023   Prostate cancer metastatic to bone (HCC) 04/08/2023   Gout 04/04/2023   Prostate mass 03/26/2023   AKI (acute kidney injury) (HCC) 03/23/2023   Microcytic anemia 03/23/2023   Essential hypertension, benign 03/23/2023   Past Medical History:  Diagnosis Date   CKD stage 3a, GFR 45-59 ml/min (HCC)    Diabetes (HCC) 07/19/2023   Elevated creatine kinase level    Hypertension    Prostate cancer metastatic to bone (HCC)     ROS   As per assessment and plan  Objective:     BP (!) 118/56 (BP Location: Right Arm, Patient Position: Sitting)   Pulse 64   Temp 98.4 F (36.9 C) (Oral)   Ht 5\' 9"  (1.753 m)   Wt 220 lb 4.8 oz (99.9 kg)   SpO2 100%   BMI 32.53 kg/m  BP Readings from Last 3 Encounters:  01/24/24 (!) 118/56  11/07/23 130/73  09/05/23 135/72   Wt Readings from Last 3 Encounters:  01/24/24 220 lb 4.8 oz (99.9 kg)  11/07/23 211 lb 6.4 oz (95.9 kg)  09/05/23 210 lb 8 oz (95.5 kg)   SpO2 Readings from Last 3 Encounters:  01/24/24 100%  11/07/23 99%  09/05/23 99%      Physical Exam  General: Sitting in chair, no acute distress Cardiovascular:  Regular rate, no murmurs appreciated Pulmonary: Breathing comfortably, no wheezing or crackles noted Abdomen: Soft, nontender, nondistended, bowel sounds present MSK: No lower extremity edema bilaterally Psych: Pleasant  Results for orders placed or performed in visit on 01/24/24  Glucose, capillary  Result Value Ref Range   Glucose-Capillary 105 (H) 70 - 99 mg/dL  Brain natriuretic peptide  Result Value Ref Range   B Natriuretic Peptide 51.9 0.0 - 100.0 pg/mL  POC Hbg A1C  Result Value Ref Range   Hemoglobin A1C 7.7 (A) 4.0 - 5.6 %   HbA1c POC (<> result, manual entry)     HbA1c, POC (prediabetic range)     HbA1c, POC (controlled diabetic range)      Last CBC Lab Results  Component Value Date   WBC 6.2 11/07/2023   HGB 10.3 (L) 11/07/2023   HCT 31.6 (L) 11/07/2023   MCV 74.5 (L) 11/07/2023   MCH 24.3 (L) 11/07/2023   RDW 15.2 11/07/2023   PLT 154 11/07/2023   Last metabolic panel Lab Results  Component Value Date   GLUCOSE 83 11/07/2023   NA 135 11/07/2023   K 5.4 (H) 11/07/2023   CL 107 11/07/2023   CO2 22 11/07/2023   BUN 21 11/07/2023   CREATININE 2.26 (H) 11/07/2023   GFRNONAA 31 (  L) 11/07/2023   CALCIUM 10.1 11/07/2023   PHOS 5.1 (H) 03/26/2023   PROT 7.7 11/07/2023   ALBUMIN 4.4 11/07/2023   BILITOT 0.4 11/07/2023   ALKPHOS 60 11/07/2023   AST 12 (L) 11/07/2023   ALT 10 11/07/2023   ANIONGAP 6 11/07/2023   Last lipids Lab Results  Component Value Date   CHOL 110 03/18/2011   HDL 38 (L) 03/18/2011   LDLCALC  03/18/2011    61        Total Cholesterol/HDL:CHD Risk Coronary Heart Disease Risk Table                     Men   Women  1/2 Average Risk   3.4   3.3  Average Risk       5.0   4.4  2 X Average Risk   9.6   7.1  3 X Average Risk  23.4   11.0        Use the calculated Patient Ratio above and the CHD Risk Table to determine the patient's CHD Risk.        ATP III CLASSIFICATION (LDL):  <100     mg/dL   Optimal  865-784  mg/dL   Near or  Above                    Optimal  130-159  mg/dL   Borderline  696-295  mg/dL   High  >284     mg/dL   Very High   TRIG 54 13/24/4010   CHOLHDL 2.9 03/18/2011   Last hemoglobin A1c Lab Results  Component Value Date   HGBA1C 7.7 (A) 01/24/2024      The ASCVD Risk score (Arnett DK, et al., 2019) failed to calculate for the following reasons:   Cannot find a previous HDL lab   Cannot find a previous total cholesterol lab    Assessment & Plan:   Problem List Items Addressed This Visit       Cardiovascular and Mediastinum   Essential hypertension, benign   BP Readings from Last 3 Encounters:  01/24/24 (!) 118/56  11/07/23 130/73  09/05/23 135/72   Patient reports he takes amlodipine 5 mg daily and carvedilol 25 mg daily. -Continue amlodipine 5 mg and carvedilol 25 mg daily -Will check BMET today       HFrEF (heart failure with reduced ejection fraction) (HCC)   Patient reports 1 week onset of chest tightness and shortness of breath with activity. Denies any orthopnea and lower extremity swelling. Patient had NM Myocardial imagining 2012 that showed EF 32% with global hypokinesis. Reports that he has cardiologist, last followed last year, but no report per chart review.   - Follow up on ECHO - Follow up on BNP        Relevant Orders   ECHOCARDIOGRAM COMPLETE     Endocrine   Diabetes (HCC) - Primary   Lab Results  Component Value Date   HGBA1C 7.7 (A) 01/24/2024   Last Microalbumin was elevated at 284 [07/19/2023], was started on Jardiance. Reports that he is not taking this medication because it is too expensive. He takes metformin 1000 mg BID. Reports that he has eating a lot of yams lately.  Is up-to-date on ophthalmology exam.  I have reached out to pharmacy assistance for Jardiance, she reported that the medications are $47 per month.  And had recommended the patient call social security at [phone] to enroll for Extra Help/Low Income  Subsidy.   -Follow-up  GFR, if less than <30, will need to stop metformin.      Relevant Orders   POC Hbg A1C (Completed)   Pneumococcal conjugate vaccine 20-valent (Prevnar 20) (Completed)     Genitourinary   CKD (chronic kidney disease) stage 3, GFR 30-59 ml/min (HCC)   He was diagnosed with metastatic prostate cancer and underwent TURP 05/12/2023, follows oncology regularly. He was previously on losartan and spironolactone, however changed to amlodipine and coreg in the setting of AKI.  - Will check BMET and K (hyperkalemia) to reconsider starting ACEi/ARB      Relevant Orders   Brain natriuretic peptide (Completed)   BMP8+Anion Gap     Other   Pneumococcal vaccine administered    Return in about 1 month (around 02/23/2024) for Routine .    Jeral Pinch, DO

## 2024-01-24 NOTE — Assessment & Plan Note (Addendum)
 Patient reports 1 week onset of chest tightness and shortness of breath with activity. Denies any orthopnea and lower extremity swelling. Patient had NM Myocardial imagining 2012 that showed EF 32% with global hypokinesis. Reports that he has cardiologist, last followed last year, but no report per chart review.   - Follow up on ECHO - Follow up on BNP

## 2024-01-24 NOTE — Patient Instructions (Signed)
 Thank you, Carlos Martin for allowing [place] to provide your care today. Today we discussed:  - Continue your medications at this time  - I will follow up with labs.  - I ordered ECHO for your.   I have ordered the following labs for you:  Lab Orders         Glucose, capillary         BMP8+Anion Gap         Brain natriuretic peptide         POC Hbg A1C      Tests ordered today:  AS above   Referrals ordered today:   Referral Orders  No referral(s) requested today     I have ordered the following medication/changed the following medications:   Stop the following medications: There are no discontinued medications.   Start the following medications: No orders of the defined types were placed in this encounter.    Follow up:  1 month      Remember:   Should you have any questions or concerns please call the internal medicine clinic at [phone].     Jeral Pinch, DO [facility]

## 2024-01-24 NOTE — Assessment & Plan Note (Signed)
 He was diagnosed with metastatic prostate cancer and underwent TURP 05/12/2023, follows oncology regularly. He was previously on losartan and spironolactone, however changed to amlodipine and coreg in the setting of AKI.  - Will check BMET and K (hyperkalemia) to reconsider starting ACEi/ARB

## 2024-01-24 NOTE — Assessment & Plan Note (Signed)
 BP Readings from Last 3 Encounters:  01/24/24 (!) 118/56  11/07/23 130/73  09/05/23 135/72   Patient reports he takes amlodipine 5 mg daily and carvedilol 25 mg daily. -Continue amlodipine 5 mg and carvedilol 25 mg daily -Will check BMET today

## 2024-01-25 ENCOUNTER — Other Ambulatory Visit

## 2024-01-25 NOTE — Progress Notes (Signed)
 Internal Medicine Clinic Attending  Case discussed with the resident at the time of the visit.  We reviewed the resident's history and exam and pertinent patient test results.  I agree with the assessment, diagnosis, and plan of care documented in the resident's note. Having some dyspnea with exertion.  Nuclear Stress test in 2012 no ischemia present but globally reduced EF, cannot see follow up ECHO since then or any time forward will get echo and bnp to evaluate further.  May need referral to Cardiology based on results or no improvement in symptoms.

## 2024-01-27 ENCOUNTER — Other Ambulatory Visit

## 2024-01-27 DIAGNOSIS — N1831 Chronic kidney disease, stage 3a: Secondary | ICD-10-CM

## 2024-01-27 LAB — BASIC METABOLIC PANEL WITH GFR
Anion gap: 10 (ref 5–15)
BUN: 40 mg/dL — ABNORMAL HIGH (ref 8–23)
CO2: 19 mmol/L — ABNORMAL LOW (ref 22–32)
Calcium: 9.8 mg/dL (ref 8.9–10.3)
Chloride: 108 mmol/L (ref 98–111)
Creatinine, Ser: 2.1 mg/dL — ABNORMAL HIGH (ref 0.61–1.24)
GFR, Estimated: 34 mL/min — ABNORMAL LOW (ref >60)
Glucose, Bld: 117 mg/dL — ABNORMAL HIGH (ref 70–99)
Potassium: 5.5 mmol/L — ABNORMAL HIGH (ref 3.5–5.1)
Sodium: 137 mmol/L (ref 135–145)

## 2024-01-27 NOTE — Addendum Note (Signed)
 Addended by: Bufford Spikes on: 01/27/2024 10:36 AM   Modules accepted: Orders

## 2024-02-01 ENCOUNTER — Other Ambulatory Visit: Payer: Self-pay | Admitting: Student

## 2024-02-01 DIAGNOSIS — H539 Unspecified visual disturbance: Secondary | ICD-10-CM

## 2024-02-01 NOTE — Telephone Encounter (Signed)
 Medication sent to pharmacy

## 2024-02-05 NOTE — Progress Notes (Signed)
 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 Bone scan and spinal MRI showed diffuse bone mets, PSA 524. Bone biopsy confirmed metastatic prostate cancer.  -The incurable nature of his disease and treatment options were discussed along with the need for long term treatment -he started ADT in hospital and his back pain has much improved, will continue ADT and change to Eligard  on 7/8. He previouly had reservation about the therapy, after detailed discussion he agreed to start on 7/19.  -His PSMA scan showed multiple bone mets and pelvic node metastasis  -he has started systemic therapy with Zytiga  and prednisone  in June 2024, he is tolerating well overall.  Will continue. -Will monitor his PSA closely, it dropped to 1.2 in July 2024  -11/07/2023 - PSA level pending.  --patient asking about alternative treatments available for his prostate cancer. He is unhappy about having to take pills indefinitely. Specifically, he has asked about radiation or surgery as possible alternatives to the pills he is taking.  -We previously reviewed the incurable nature of his metastatic disease, and the need for long-term treatment.  --specifically, we reviewed his PET scan from 04/2023 which did show multifocal bone metastases along with nodal metastases in the left pelvic side wall and left perirectal space. He had whole body bone scan done 03/2023 which showed multifocal osseous metastatic disease in ribs, sternum, scapula, spine, pelvis, and bilateral proximal femurs. He is not a good candidate for radiation, due to  the multifocal quality of his disease. Surgery is also not good option as it would take away the need for eligard  injections every 6 months, however, he would still need to take Zytiga  ( or similar medication) everyday. -His bone metastases require bone-strengthening medication due to cancer in the bones, which can cause fractures. Dental clearance needed before starting bone medication to prevent complications such as jaw necrosis. Again, explained benefits of bone medication in preventing fractures and risks of dental complications without clearance. -Will cancel Xgeva  injection until dental clearance is obtained    The patient understands the plans discussed today and is in agreement with them.  He knows to contact our office if he develops concerns prior to his next appointment.  I provided 25 minutes of face-to-face time during this encounter and > 50% was spent counseling as documented under my assessment and plan.    Sharyon Deis, NP  [facility] - A DEPT OF [facility]  [facility]OS[facility] [address] [place] [address] Dept: [phone] Dept Fax: [phone]   No orders of the defined types were placed in this encounter.     CHIEF COMPLAINT:  CC: Prostate cancer metastatic to bone  Current Treatment: Eligard  injection every 6 months, abiraterone  and prednisone   INTERVAL HISTORY:  Tavio is here today for repeat clinical assessment. He denies fevers or chills. He denies pain. His appetite is good. His weight has been stable.  I have reviewed the past medical history, past surgical history, social history and family history with the patient and they are unchanged from previous note.  ALLERGIES:  has  no known allergies.  MEDICATIONS:  Current Outpatient Medications  Medication Sig Dispense Refill   amLODipine  (NORVASC ) 5 MG tablet Take 5 mg by mouth daily.     atorvastatin  (LIPITOR) 40 MG tablet Take 40 mg by mouth  daily.     bisacodyl  5 MG EC tablet Take 2 tablets (10 mg total) by mouth daily as needed for moderate constipation. 30 tablet 0   carvedilol  (COREG ) 25 MG tablet Take 25 mg by mouth daily.     JARDIANCE  10 MG TABS tablet Take 1 tablet by mouth once daily 30 tablet 0   latanoprost  (XALATAN ) 0.005 % ophthalmic solution INSTILL 1 DROP INTO EACH EYE ONCE DAILY 3 mL 3   metFORMIN  (GLUCOPHAGE ) 1000 MG tablet Take 1,000 mg by mouth 2 (two) times daily.     Multiple Vitamin (MULTIVITAMIN WITH MINERALS) TABS tablet Take 1 tablet by mouth daily.     multivitamin-lutein  (OCUVITE-LUTEIN ) CAPS capsule Take 1 capsule by mouth daily.     predniSONE  (DELTASONE ) 5 MG tablet Take 1 tablet (5 mg total) by mouth daily with breakfast. 90 tablet 1   abiraterone  acetate (ZYTIGA ) 250 MG tablet Take 4 tablets (1,000 mg total) by mouth daily. Take on an empty stomach 1 hour before or 2 hours after a meal 360 tablet 1   No current facility-administered medications for this visit.    HISTORY OF PRESENT ILLNESS:   Oncology History Overview Note   Cancer Staging  Prostate cancer metastatic to bone [facility]) Staging form: Prostate, AJCC 8th Edition - Clinical stage from 03/28/2023: Stage IVB (cTX, cN0, pM1, PSA: 524) - Signed by Sonja Cabo [place]jo, MD on 04/08/2023 Prostate specific antigen (PSA) range: 20 or greater     Prostate cancer metastatic to bone (HCC)  03/24/2023 Imaging   NM BONE SCAN WHOLE BODY   IMPRESSION: Multifocal osseous metastatic disease. Areas include numerous bilateral ribs, sternum, scapula, spine, pelvis and proximal femurs     03/28/2023 Cancer Staging   Staging form: Prostate, AJCC 8th Edition - Clinical stage from 03/28/2023: Stage IVB (cTX, cN0, pM1, PSA: 524) - Signed by Sonja [place], MD on 04/08/2023 Prostate specific antigen (PSA) range: 20 or greater   03/28/2023 Pathology Results     FINAL MICROSCOPIC DIAGNOSIS:  A. RIGHT ILIAC, BONE BIOPSY: Metastatic poorly differentiated prostatic  adenocarcinoma   04/08/2023 Initial Diagnosis   Prostate cancer metastatic to bone [facility])       REVIEW OF SYSTEMS:   Constitutional: Denies fevers, chills or abnormal weight loss Eyes: Denies blurriness of vision Ears, nose, mouth, throat, and face: Denies mucositis or sore throat Respiratory: Denies cough, dyspnea or wheezes Cardiovascular: Denies palpitation, chest discomfort or lower extremity swelling Gastrointestinal:  Denies nausea, heartburn or change in bowel habits Skin: Denies abnormal skin rashes Lymphatics: Denies new lymphadenopathy or easy bruising Neurological:Denies numbness, tingling or new weaknesses Behavioral/Psych: Mood is stable, no new changes  All other systems were reviewed with the patient and are negative.   VITALS:  Blood pressure 116/60, pulse 72, temperature 97.8 F (36.6 C), resp. rate 17, weight 221 lb 14.4 oz (100.7 kg), SpO2 98%.  Wt Readings from Last 3 Encounters:  02/06/24 221 lb 14.4 oz (100.7 kg)  01/24/24 220 lb 4.8 oz (99.9 kg)  11/07/23 211 lb 6.4 oz (95.9 kg)    Body mass index is 32.77 kg/m.  Performance status (ECOG): 0 - Asymptomatic  PHYSICAL EXAM:   GENERAL:alert, no distress and comfortable SKIN: skin color, texture, turgor are normal, no rashes or  significant lesions EYES: normal, Conjunctiva are pink and non-injected, sclera clear OROPHARYNX:no exudate, no erythema and lips, buccal mucosa, and tongue normal  NECK: supple, thyroid normal size, non-tender, without nodularity LYMPH:  no palpable lymphadenopathy in the cervical, axillary or inguinal LUNGS: clear to auscultation and percussion with normal breathing effort HEART: regular rate & rhythm and no murmurs and no lower extremity edema ABDOMEN:abdomen soft, non-tender and normal bowel sounds Musculoskeletal:no cyanosis of digits and no clubbing  NEURO: alert & oriented x 3 with fluent speech, no focal motor/sensory deficits  LABORATORY DATA:  I have reviewed the  data as listed    Component Value Date/Time   NA 134 (L) 02/06/2024 1011   NA 140 04/04/2023 1042   K 5.7 (H) 02/06/2024 1011   CL 106 02/06/2024 1011   CO2 21 (L) 02/06/2024 1011   GLUCOSE 125 (H) 02/06/2024 1011   BUN 48 (H) 02/06/2024 1011   BUN 12 04/04/2023 1042   CREATININE 2.11 (H) 02/06/2024 1011   CALCIUM  10.3 02/06/2024 1011   PROT 8.1 02/06/2024 1011   ALBUMIN 4.5 02/06/2024 1011   AST 13 (L) 02/06/2024 1011   ALT 13 02/06/2024 1011   ALKPHOS 64 02/06/2024 1011   BILITOT 0.4 02/06/2024 1011   GFRNONAA 34 (L) 02/06/2024 1011   GFRAA >90 06/27/2013 1212    No results found for: "SPEP", "UPEP"  Lab Results  Component Value Date   WBC 7.7 02/06/2024   NEUTROABS 5.4 02/06/2024   HGB 10.2 (L) 02/06/2024   HCT 30.7 (L) 02/06/2024   MCV 74.7 (L) 02/06/2024   PLT 189 02/06/2024      Chemistry      Component Value Date/Time   NA 134 (L) 02/06/2024 1011   NA 140 04/04/2023 1042   K 5.7 (H) 02/06/2024 1011   CL 106 02/06/2024 1011   CO2 21 (L) 02/06/2024 1011   BUN 48 (H) 02/06/2024 1011   BUN 12 04/04/2023 1042   CREATININE 2.11 (H) 02/06/2024 1011      Component Value Date/Time   CALCIUM  10.3 02/06/2024 1011   ALKPHOS 64 02/06/2024 1011   AST 13 (L) 02/06/2024 1011   ALT 13 02/06/2024 1011   BILITOT 0.4 02/06/2024 1011       RADIOGRAPHIC STUDIES: I have personally reviewed the radiological images as listed and agreed with the findings in the report. No results found.  Addendum I have seen the patient, examined him. I agree with the assessment and and plan and have edited the notes.   Patient is clinically doing well, will continue current treatment.  He is due for Eligard  injection on May 06, 2024, however he will be [place] of [place] then.  I recommend oral Orgovyx  120mg  daily from July 20 to the end of August 2025 when he returns.  He will restart Eligard  injection in early September.  Will continue abiraterone  and low-dose prednisone , will reach out  to oral pharmacist Ivette Marks regarding his high co-pay, our social worker Amalia Badder also talked to him about financial assistance.  All questions were answered.  I spent a total of 25 minutes for his visit today, more than 50% time on face-to-face counseling.  Sonja Healdton MD 02/06/2024

## 2024-02-06 ENCOUNTER — Telehealth: Payer: Self-pay | Admitting: Pharmacy Technician

## 2024-02-06 ENCOUNTER — Inpatient Hospital Stay: Payer: Medicare HMO

## 2024-02-06 ENCOUNTER — Inpatient Hospital Stay: Admitting: Licensed Clinical Social Worker

## 2024-02-06 ENCOUNTER — Telehealth: Payer: Self-pay | Admitting: Pharmacist

## 2024-02-06 ENCOUNTER — Other Ambulatory Visit (HOSPITAL_COMMUNITY): Payer: Self-pay

## 2024-02-06 ENCOUNTER — Inpatient Hospital Stay (HOSPITAL_BASED_OUTPATIENT_CLINIC_OR_DEPARTMENT_OTHER): Payer: Medicare HMO | Admitting: Nurse Practitioner

## 2024-02-06 ENCOUNTER — Encounter: Payer: Self-pay | Admitting: Hematology

## 2024-02-06 ENCOUNTER — Inpatient Hospital Stay: Payer: Medicare HMO | Attending: Nurse Practitioner

## 2024-02-06 DIAGNOSIS — C61 Malignant neoplasm of prostate: Secondary | ICD-10-CM | POA: Insufficient documentation

## 2024-02-06 DIAGNOSIS — C7951 Secondary malignant neoplasm of bone: Secondary | ICD-10-CM

## 2024-02-06 DIAGNOSIS — C775 Secondary and unspecified malignant neoplasm of intrapelvic lymph nodes: Secondary | ICD-10-CM | POA: Diagnosis not present

## 2024-02-06 LAB — CBC WITH DIFFERENTIAL (CANCER CENTER ONLY)
Abs Immature Granulocytes: 0.04 10*3/uL (ref 0.00–0.07)
Basophils Absolute: 0 10*3/uL (ref 0.0–0.1)
Basophils Relative: 1 %
Eosinophils Absolute: 0.1 10*3/uL (ref 0.0–0.5)
Eosinophils Relative: 1 %
HCT: 30.7 % — ABNORMAL LOW (ref 39.0–52.0)
Hemoglobin: 10.2 g/dL — ABNORMAL LOW (ref 13.0–17.0)
Immature Granulocytes: 1 %
Lymphocytes Relative: 22 %
Lymphs Abs: 1.7 10*3/uL (ref 0.7–4.0)
MCH: 24.8 pg — ABNORMAL LOW (ref 26.0–34.0)
MCHC: 33.2 g/dL (ref 30.0–36.0)
MCV: 74.7 fL — ABNORMAL LOW (ref 80.0–100.0)
Monocytes Absolute: 0.4 10*3/uL (ref 0.1–1.0)
Monocytes Relative: 6 %
Neutro Abs: 5.4 10*3/uL (ref 1.7–7.7)
Neutrophils Relative %: 69 %
Platelet Count: 189 10*3/uL (ref 150–400)
RBC: 4.11 MIL/uL — ABNORMAL LOW (ref 4.22–5.81)
RDW: 13.6 % (ref 11.5–15.5)
WBC Count: 7.7 10*3/uL (ref 4.0–10.5)
nRBC: 0 % (ref 0.0–0.2)

## 2024-02-06 LAB — CMP (CANCER CENTER ONLY)
ALT: 13 U/L (ref 0–44)
AST: 13 U/L — ABNORMAL LOW (ref 15–41)
Albumin: 4.5 g/dL (ref 3.5–5.0)
Alkaline Phosphatase: 64 U/L (ref 38–126)
Anion gap: 7 (ref 5–15)
BUN: 48 mg/dL — ABNORMAL HIGH (ref 8–23)
CO2: 21 mmol/L — ABNORMAL LOW (ref 22–32)
Calcium: 10.3 mg/dL (ref 8.9–10.3)
Chloride: 106 mmol/L (ref 98–111)
Creatinine: 2.11 mg/dL — ABNORMAL HIGH (ref 0.61–1.24)
GFR, Estimated: 34 mL/min — ABNORMAL LOW (ref >60)
Glucose, Bld: 125 mg/dL — ABNORMAL HIGH (ref 70–99)
Potassium: 5.7 mmol/L — ABNORMAL HIGH (ref 3.5–5.1)
Sodium: 134 mmol/L — ABNORMAL LOW (ref 135–145)
Total Bilirubin: 0.4 mg/dL (ref 0.0–1.2)
Total Protein: 8.1 g/dL (ref 6.5–8.1)

## 2024-02-06 LAB — FERRITIN: Ferritin: 126 ng/mL (ref 24–336)

## 2024-02-06 MED ORDER — ABIRATERONE ACETATE 250 MG PO TABS: 1000.0000 mg | ORAL_TABLET | Freq: Every day | ORAL | 1 refills | Status: DC

## 2024-02-06 MED ORDER — RELUGOLIX 120 MG PO TABS: ORAL_TABLET | ORAL | 0 refills | Status: DC

## 2024-02-06 MED ORDER — RELUGOLIX 120 MG PO TABS: 120.0000 mg | ORAL_TABLET | Freq: Every day | ORAL | 0 refills | Status: DC

## 2024-02-06 MED ORDER — ABIRATERONE ACETATE 250 MG PO TABS
1000.0000 mg | ORAL_TABLET | Freq: Every day | ORAL | 1 refills | Status: DC
Start: 2024-02-06 — End: 2024-02-06

## 2024-02-06 NOTE — Telephone Encounter (Addendum)
 Oral Oncology Pharmacist Encounter  Received new prescription for Orgovyx  (relugolix ) for the treatment of metastatic prostate cancer in conjunction with abiraterone  and prednisone , planned duration until disease progression or unacceptable drug toxicity. Patient is not starting until 05/06/24 (patient received last dose of Eligard  on 11/07/2023).  Of note - patient will switch filling Zytiga  from [facility] to [facility] going forward as his insurance no longer mandates fills [facility] and our pharmacy has been able to obtain patient a grant to keep his OOP cost for both Zytiga  and Orogvyx to $0.   CBC w/ Diff and CMP from 02/06/24 assessed, noted patient Scr of 2.11 mg/dL (CrCl ~60.4 mL/min) - no baseline renal dose adjustments required for Orgovyx  per package insert. Prescription dose and frequency assessed for appropriateness. Patient will need loading dose of 360 mg x 1, then start 120 mg daily thereafter with Orgovyx . OK per Dr. Maryalice Smaller to update prescription. Rx update and resent to [facility].  Current medication list in Epic reviewed, DDIs with Orgovyx  identified: Category D drug-drug interaction between Orgovyx  and Carvedilol  - PgP inhibitors, like carvedilol , can increase serum concentrations, thus risk of side effects from Orgovyx . If this combination cannot be avoided, Orgovyx  can be given 6 hours before carvedilol  dosing while patient is on concomitant therapy.  Evaluated chart and no patient barriers to medication adherence noted.   Prescription has been e-scribed to the [facility] for benefits analysis and approval.  Oral Oncology Clinic will continue to follow for insurance authorization, copayment issues, initial counseling and start date.  Jude Norton, PharmD, BCPS, BCOP Hematology/Oncology Clinical Pharmacist [facility] and [facility] Oral Chemotherapy Navigation Clinics [phone] 02/06/2024 3:58 PM

## 2024-02-06 NOTE — Telephone Encounter (Signed)
 Oral Oncology Patient Advocate Encounter   Was successful in securing patient an $97 grant from [employer] [facility]) to provide copayment coverage for abiraterone .  This will keep the out of pocket expense at $0.     The billing information is as follows and has been shared with [facility].   Member ID: [id number] Group ID: [id number] RxBin: [id number] Dates of Eligibility: 11/07/23 through 02/03/25  Fund:  Prostate Cancer Bernadine Briar, CPhT Specialty Pharmacy Patient Advocate Phone: [phone] Fax: [phone]

## 2024-02-06 NOTE — Progress Notes (Signed)
 [facility] CSW Progress Note  Clinical Child psychotherapist met w/ pt to discuss difficulty paying for medication.  Pt has both Medicare and Medicaid listed as insurance.  [facility] states [facility] and they have informed him that they cannot find his Medicaid in the system which has resulted in him needing to pay a co-pay he is unable to continue to afford.  CSW recommended pt contact Medicaid by phone while he is at the pharmacy in order for the pharmacy to speak directly to Medicaid to confirm he is enrolled.  Pt states he is not able to do this because his medications are mailed to him and he does not actually see anyone at the pharmacy.  CSW suggested pt use the [facility] pharmacy.  Pt states his plan is to continue to use his current pharmacy for the rest of the year and will switch next year.  Per Dr.  Maryalice Smaller pharmacy is looking into pt's insurance issue.  CSW sent referral on behalf of pt to Cancer Services for financial support.      Quenton Bruns, LCSW Clinical Social Worker [facility]

## 2024-02-06 NOTE — Assessment & Plan Note (Signed)
 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 Bone scan and spinal MRI showed diffuse bone mets, PSA 524. Bone biopsy confirmed metastatic prostate cancer.  -The incurable nature of his disease and treatment options were discussed along with the need for long term treatment -he started ADT in hospital and his back pain has much improved, will continue ADT and change to Eligard on 7/8. He previouly had reservation about the therapy, after detailed discussion he agreed to start on 7/19.  -His PSMA scan showed multiple bone mets and pelvic node metastasis  -he has started systemic therapy with Zytiga and prednisone in June 2024, he is tolerating well overall.  Will continue. -Will monitor his PSA closely, it dropped to 1.2 in July 2024  -11/07/2023 - PSA level pending.  --patient asking about alternative treatments available for his prostate cancer. He is unhappy about having to take pills indefinitely. Specifically, he has asked about radiation or surgery as possible alternatives to the pills he is taking.  -We previously reviewed the incurable nature of his metastatic disease, and the need for long-term treatment.  --specifically, we reviewed his PET scan from 04/2023 which did show multifocal bone metastases along with nodal metastases in the left pelvic side wall and left perirectal space. He had whole body bone scan done 03/2023 which showed multifocal osseous metastatic disease in ribs, sternum, scapula, spine, pelvis, and bilateral proximal femurs. He is not a good candidate for radiation, due to the multifocal quality of his disease. Surgery is also not good option as it would take away the need for eligard injections every 6 months, however, he would still need to take Zytiga ( or similar medication) everyday. -His bone metastases require bone-strengthening medication due to cancer in the bones, which can cause fractures. Dental  clearance needed before starting bone medication to prevent complications such as jaw necrosis. Again, explained benefits of bone medication in preventing fractures and risks of dental complications without clearance. -Will cancel Xgeva injection until dental clearance is obtained

## 2024-02-07 ENCOUNTER — Other Ambulatory Visit (HOSPITAL_COMMUNITY): Payer: Self-pay

## 2024-02-07 DIAGNOSIS — E782 Mixed hyperlipidemia: Secondary | ICD-10-CM | POA: Diagnosis not present

## 2024-02-07 DIAGNOSIS — I5022 Chronic systolic (congestive) heart failure: Secondary | ICD-10-CM | POA: Diagnosis not present

## 2024-02-07 DIAGNOSIS — E1122 Type 2 diabetes mellitus with diabetic chronic kidney disease: Secondary | ICD-10-CM | POA: Diagnosis not present

## 2024-02-07 DIAGNOSIS — I10 Essential (primary) hypertension: Secondary | ICD-10-CM | POA: Diagnosis not present

## 2024-02-07 LAB — PSA, TOTAL AND FREE
PSA, Free Pct: UNDETERMINED %
PSA, Free: <0.02 ng/mL
Prostate Specific Ag, Serum: <0.1 ng/mL (ref 0.0–4.0)

## 2024-02-07 MED ORDER — RELUGOLIX 120 MG PO TABS
ORAL_TABLET | ORAL | 0 refills | Status: DC
  Filled 2024-02-09: qty 30, 30d supply, fill #0
  Filled 2024-02-09: qty 30, fill #0

## 2024-02-07 NOTE — Telephone Encounter (Signed)
 Pt should be able to fill his medication using his Medicare insurance, then Medicaid secondary. If for some reason he loses his Medicaid or can't file it, the [facility] should pick up.   Attempted to call pt, left HIPAA compliant VoiceMail requesting a return call. Direct office number provided. Call is in regard to setting up his refills to come from [facility]. (We will call him next week to set up)

## 2024-02-09 ENCOUNTER — Other Ambulatory Visit: Payer: Self-pay | Admitting: Student

## 2024-02-09 ENCOUNTER — Encounter: Payer: Self-pay | Admitting: Hematology

## 2024-02-09 ENCOUNTER — Other Ambulatory Visit (HOSPITAL_COMMUNITY): Payer: Self-pay

## 2024-02-09 ENCOUNTER — Other Ambulatory Visit: Payer: Self-pay | Admitting: Pharmacy Technician

## 2024-02-09 ENCOUNTER — Other Ambulatory Visit: Payer: Self-pay

## 2024-02-09 MED ORDER — METOPROLOL TARTRATE 25 MG PO TABS
25.0000 mg | ORAL_TABLET | Freq: Two times a day (BID) | ORAL | 11 refills | Status: DC
  Filled 2024-02-09 – 2024-02-17 (×2): qty 60, 30d supply, fill #0

## 2024-02-09 NOTE — Progress Notes (Signed)
 Oral Chemotherapy Pharmacist Encounter  Patient was counseled under telephone encounter from 02/06/24.  Jude Norton, PharmD, BCPS, BCOP Hematology/Oncology Clinical Pharmacist [facility] and [facility] Oral Chemotherapy Navigation Clinics [phone] 02/09/2024 10:56 AM

## 2024-02-09 NOTE — Progress Notes (Signed)
 Specialty Pharmacy Initial Fill Coordination Note  Carlos Martin is a 68 y.o. male contacted today regarding refills of specialty medication(s) Relugolix  (ORGOVYX ) .  Patient requested Delivery  on 02/13/24  to verified address [address] [place] [address]   Medication will be filled on 02/09/24.   Patient is aware of $4 copayment.

## 2024-02-09 NOTE — Telephone Encounter (Signed)
 Oral Chemotherapy Pharmacist Encounter  I spoke with patient for overview of: Orgovyx  (relugolix ) for the treatment of metastatic prostate cancer in conjunction with abiraterone  and prednisone , planned duration until disease progression or unacceptable drug toxicity. Patient is not starting until 05/06/24 (patient received last dose of Eligard  on 11/07/2023).   Counseled patient on administration, dosing, side effects, monitoring, drug-food interactions, safe handling, storage, and disposal.  Patient will take Orgovyx  120 mg tablets, 3 tablets (360 mg total) for 1 dose, and then 1 tablet (120 mg total) by mouth daily thereafter.  Patient's PCP is going to switch him from carvedilol  to metoprolol  to avoid the drug-drug interaction of increased side effects from Orgovyx  when used concomitantly with carvedilol . Explained to patient that he will be switching from carvedilol  to a similar medication, metoprolol , as metoprolol  does not have the same drug-drug interactions that Orgovyx  has. Patient expressed understanding.   Orgovyx  start date: 05/06/24 (patient's last dose of Eligard  was 11/07/23)   Adverse effects include but are not limited to: hot flashes, muscle pain, and fatigue, increased blood glucose, changes in LFTs.  Explained to patient we will fill the "loading dose" prescription for Orgovyx  for him now, and then do one more "maintenance fill" of Orgovyx  billed through his insurance before he leaves on 03/06/24. Patient expressed appreciation and understanding, and knows he will not start the Orgovyx  until 05/06/24.  Reviewed with patient importance of keeping a medication schedule and plan for any missed doses. No barriers to medication adherence identified.  Medication reconciliation performed and medication/allergy list updated.  All questions answered.  Mr. Nall voiced understanding and appreciation.   Medication education handout placed in mail for patient. Patient knows to call the  office with questions or concerns. Oral Chemotherapy Clinic phone number provided to patient.   Jude Norton, PharmD, BCPS, BCOP Hematology/Oncology Clinical Pharmacist [facility] and [facility] Oral Chemotherapy Navigation Clinics [phone] 02/09/2024 10:52 AM

## 2024-02-09 NOTE — Progress Notes (Signed)
 carvedilol  (carvedilol  increases the risk of SE of the Orgovyx ). Will stop Coreg  and start metoprolol  25 mg daily.

## 2024-02-10 ENCOUNTER — Other Ambulatory Visit: Payer: Self-pay

## 2024-02-10 ENCOUNTER — Encounter: Payer: Self-pay | Admitting: Hematology

## 2024-02-10 ENCOUNTER — Other Ambulatory Visit (HOSPITAL_COMMUNITY): Payer: Self-pay

## 2024-02-13 ENCOUNTER — Other Ambulatory Visit: Payer: Self-pay | Admitting: Hematology

## 2024-02-13 DIAGNOSIS — C61 Malignant neoplasm of prostate: Secondary | ICD-10-CM

## 2024-02-14 ENCOUNTER — Other Ambulatory Visit: Payer: Self-pay

## 2024-02-15 ENCOUNTER — Other Ambulatory Visit: Payer: Self-pay | Admitting: Hematology

## 2024-02-15 DIAGNOSIS — R338 Other retention of urine: Secondary | ICD-10-CM | POA: Diagnosis not present

## 2024-02-15 DIAGNOSIS — C61 Malignant neoplasm of prostate: Secondary | ICD-10-CM | POA: Diagnosis not present

## 2024-02-17 ENCOUNTER — Other Ambulatory Visit: Payer: Self-pay

## 2024-02-17 ENCOUNTER — Other Ambulatory Visit (HOSPITAL_COMMUNITY): Payer: Self-pay

## 2024-02-20 ENCOUNTER — Other Ambulatory Visit: Payer: Self-pay | Admitting: Pharmacy Technician

## 2024-02-20 ENCOUNTER — Other Ambulatory Visit: Payer: Self-pay

## 2024-02-20 ENCOUNTER — Telehealth: Payer: Self-pay | Admitting: Pharmacist

## 2024-02-20 ENCOUNTER — Telehealth: Payer: Self-pay | Admitting: Pharmacy Technician

## 2024-02-20 ENCOUNTER — Other Ambulatory Visit (HOSPITAL_COMMUNITY): Payer: Self-pay

## 2024-02-20 DIAGNOSIS — C61 Malignant neoplasm of prostate: Secondary | ICD-10-CM

## 2024-02-20 MED ORDER — ABIRATERONE ACETATE 250 MG PO TABS
1000.0000 mg | ORAL_TABLET | Freq: Every day | ORAL | 0 refills | Status: DC
  Filled 2024-02-20: qty 360, 90d supply, fill #0

## 2024-02-20 NOTE — Telephone Encounter (Signed)
 Oral Chemotherapy Pharmacist Encounter   Notified by pharmacy patient has medicaid that allows for 3 month supply to be billed through this.  Jude Norton, PharmD, BCPS, BCOP Hematology/Oncology Clinical Pharmacist [facility] and [facility] Oral Chemotherapy Navigation Clinics [phone] 02/20/2024 2:18 PM

## 2024-02-20 NOTE — Telephone Encounter (Signed)
 Oral Oncology Patient Advocate Encounter  Received notification that prior authorization for abirateron is required for a 3 month supply per call to ins for vacation over ride. Once approved, the pt has to call about the vacation dates.    PA submitted on 02/20/24 Key  [id number] PA was denied & Ivette Marks submitted an e-appeal.      Roda Cirri, CPhT Specialty Pharmacy Patient Advocate Phone: [phone] Fax: [phone]

## 2024-02-20 NOTE — Progress Notes (Signed)
 Specialty Pharmacy Initial Fill Coordination Note  Carlos Martin is a 68 y.o. male contacted today regarding refills of specialty medication(s) Abiraterone  Acetate (ZYTIGA ) .  Patient requested Delivery  on 02/21/24  to verified address [address] [place] [address]   Medication will be filled on 02/20/24.   Patient is aware of $4 copayment.   3 month supply billed to Medicaid.

## 2024-02-20 NOTE — Progress Notes (Signed)
 Oral Chemotherapy Pharmacist Encounter  Patient was counseled under telephone encounter from 04/12/23.  Jude Norton, PharmD, BCPS, BCOP Hematology/Oncology Clinical Pharmacist [facility] and [facility] Oral Chemotherapy Navigation Clinics [phone] 02/20/2024 3:21 PM

## 2024-02-20 NOTE — Telephone Encounter (Signed)
 Oral Chemotherapy Pharmacist Encounter   Notified by patient's insurance, [employer], that they will only approve up to a 30-day supply of Zytiga  for patient.   Expedited e-appeal sent through CoverMyMeds (Key: [id number]) on 02/20/2024 to see if they will make an exception for patient's fill as he will be out of the country for ~3 months.   Oral oncology clinic will continue to follow.   Jude Norton, PharmD, BCPS, BCOP Hematology/Oncology Clinical Pharmacist [facility] and [facility] Oral Chemotherapy Navigation Clinics [phone] 02/20/2024 11:32 AM

## 2024-02-21 ENCOUNTER — Other Ambulatory Visit (HOSPITAL_COMMUNITY): Payer: Self-pay

## 2024-02-21 ENCOUNTER — Other Ambulatory Visit: Payer: Self-pay

## 2024-02-21 NOTE — Telephone Encounter (Addendum)
 Appeal was also denied.  Pt's Medicaid covered the 3 month supply that he needed.

## 2024-02-23 ENCOUNTER — Encounter: Payer: Self-pay | Admitting: Student

## 2024-02-23 ENCOUNTER — Other Ambulatory Visit: Payer: Self-pay

## 2024-02-23 ENCOUNTER — Other Ambulatory Visit (HOSPITAL_COMMUNITY): Payer: Self-pay

## 2024-02-23 ENCOUNTER — Ambulatory Visit: Admitting: Student

## 2024-02-23 VITALS — BP 133/69 | HR 69 | Temp 98.6°F | Ht 69.0 in | Wt 221.4 lb

## 2024-02-23 DIAGNOSIS — I5020 Unspecified systolic (congestive) heart failure: Secondary | ICD-10-CM | POA: Diagnosis not present

## 2024-02-23 DIAGNOSIS — R809 Proteinuria, unspecified: Secondary | ICD-10-CM

## 2024-02-23 DIAGNOSIS — E875 Hyperkalemia: Secondary | ICD-10-CM | POA: Diagnosis not present

## 2024-02-23 DIAGNOSIS — E1129 Type 2 diabetes mellitus with other diabetic kidney complication: Secondary | ICD-10-CM

## 2024-02-23 MED ORDER — METOPROLOL TARTRATE 25 MG PO TABS
25.0000 mg | ORAL_TABLET | Freq: Two times a day (BID) | ORAL | 3 refills | Status: AC
  Filled 2024-02-23 – 2024-06-11 (×2): qty 180, 90d supply, fill #0
  Filled 2024-09-04: qty 180, 90d supply, fill #1

## 2024-02-23 MED ORDER — SPIRONOLACTONE 25 MG PO TABS
25.0000 mg | ORAL_TABLET | Freq: Two times a day (BID) | ORAL | 3 refills | Status: AC
  Filled 2024-02-23: qty 90, 45d supply, fill #0
  Filled 2024-06-11: qty 90, 45d supply, fill #1
  Filled 2024-08-13 – 2024-08-15 (×2): qty 90, 45d supply, fill #2
  Filled 2024-09-24: qty 90, 45d supply, fill #3

## 2024-02-23 MED ORDER — EMPAGLIFLOZIN 10 MG PO TABS
10.0000 mg | ORAL_TABLET | Freq: Every day | ORAL | 3 refills | Status: AC
  Filled 2024-02-23: qty 90, 90d supply, fill #0
  Filled 2024-06-11: qty 90, 90d supply, fill #1
  Filled 2024-09-04: qty 90, 90d supply, fill #2

## 2024-02-23 NOTE — Progress Notes (Addendum)
 CC:  Chief Complaint  Patient presents with   Follow-up    Follow up on medication change / medication refill   HPI:  [name] is a 68 y.o. male living with a history stated below and presents today for the above. Please see problem based assessment and plan for additional details.  Past Medical History:  Diagnosis Date   CKD stage 3a, GFR 45-59 ml/min (HCC)    Diabetes (HCC) 07/19/2023   Elevated creatine kinase level    Hypertension    Prostate cancer metastatic to bone [facility])     Current Outpatient Medications on File Prior to Visit  Medication Sig Dispense Refill   abiraterone  acetate (ZYTIGA ) 250 MG tablet Take 4 tablets (1,000 mg total) by mouth daily. Take on an empty stomach 1 hour before or 2 hours after a meal 360 tablet 0   amLODipine  (NORVASC ) 5 MG tablet Take 5 mg by mouth daily.     atorvastatin  (LIPITOR) 40 MG tablet Take 40 mg by mouth daily.     bisacodyl  5 MG EC tablet Take 2 tablets (10 mg total) by mouth daily as needed for moderate constipation. 30 tablet 0   latanoprost  (XALATAN ) 0.005 % ophthalmic solution INSTILL 1 DROP INTO EACH EYE ONCE DAILY 3 mL 3   metFORMIN  (GLUCOPHAGE ) 1000 MG tablet Take 1,000 mg by mouth 2 (two) times daily.     Multiple Vitamin (MULTIVITAMIN WITH MINERALS) TABS tablet Take 1 tablet by mouth daily.     multivitamin-lutein  (OCUVITE-LUTEIN ) CAPS capsule Take 1 capsule by mouth daily.     predniSONE  (DELTASONE ) 5 MG tablet Take 1 tablet by mouth once daily with breakfast 90 tablet 0   relugolix  (ORGOVYX ) 120 MG tablet Take 3 tablets (360 mg total) by mouth on day 1, then take 1 tablet (120 mg total) daily thereafter. Start on 05/06/2024 30 tablet 0   No current facility-administered medications on file prior to visit.    No family history on file.  Social History   Socioeconomic History   Marital status: Married    Spouse name: Not on file   Number of children: 4   Years of education: Not on file   Highest education  level: Not on file  Occupational History   Not on file  Tobacco Use   Smoking status: Never   Smokeless tobacco: Not on file  Substance and Sexual Activity   Alcohol use: Yes    Alcohol/week: 1.0 standard drink of alcohol    Types: 1 Glasses of wine per week    Comment: once a month   Drug use: No   Sexual activity: Not on file  Other Topics Concern   Not on file  Social History Narrative   Not on file   Social Drivers of Health   Financial Resource Strain: Low Risk  (02/23/2024)   Overall Financial Resource Strain (CARDIA)    Difficulty of Paying Living Expenses: Not very hard  Food Insecurity: No Food Insecurity (02/23/2024)   Hunger Vital Sign    Worried About Running Out of Food in the Last Year: Never true    Ran Out of Food in the Last Year: Never true  Transportation Needs: No Transportation Needs (02/23/2024)   PRAPARE - Administrator, Civil Service (Medical): No    Lack of Transportation (Non-Medical): No  Physical Activity: Insufficiently Active (02/23/2024)   Exercise Vital Sign    Days of Exercise per Week: 3 days    Minutes of  Exercise per Session: 30 min  Stress: No Stress Concern Present (02/23/2024)   [employer] of Occupational Health - Occupational Stress Questionnaire    Feeling of Stress : Not at all  Social Connections: Socially Integrated (02/23/2024)   Social Connection and Isolation Panel [NHANES]    Frequency of Communication with Friends and Family: More than three times a week    Frequency of Social Gatherings with Friends and Family: More than three times a week    Attends Religious Services: More than 4 times per year    Active Member of [employer] or Organizations: Yes    Attends Engineer, structural: More than 4 times per year    Marital Status: Married  Catering manager Violence: Not At Risk (02/23/2024)   Humiliation, Afraid, Rape, and Kick questionnaire    Fear of Current or Ex-Partner: No    Emotionally Abused: No     Physically Abused: No    Sexually Abused: No    Review of Systems: ROS negative except for what is noted on the assessment and plan.  Vitals:   02/23/24 0918  BP: 133/69  Pulse: 69  Temp: 98.6 F (37 C)  TempSrc: Oral  SpO2: 100%  Weight: 221 lb 6.4 oz (100.4 kg)  Height: 5\' 9"  (1.753 m)    Physical Exam: Constitutional: well-appearing, in NAD HENT: normocephalic atraumatic, mucous membranes moist Eyes: conjunctiva non-erythematous Cardiovascular: regular rate and rhythm, no m/r/g, no BLE edema Pulmonary/Chest: normal work of breathing on room air, lungs clear to auscultation bilaterally Abdominal: soft, non-tender, non-distended MSK: normal bulk and tone Neurological: alert & oriented x 3, no focal deficit Skin: warm and dry Psych: normal mood and behavior  Assessment & Plan:   Patient discussed with Dr. Lanetta Pion  HFrEF (heart failure with reduced ejection fraction) (HCC) Patient has a history of heart failure with reduced ejection fraction.  Last echo was done in 20 1220 ejection fraction was 32%.  Since then he has not gotten a repeat echo.  Is wondering if he was to take metoprolol .  He was on Coreg  but this was stopped because of a potential interaction with his cancer therapy.  He reports having a cardiologist and that his cardiologist recently told him not to get the echo.  A follow-up BNP was done and it was 51.9 he does not have bilateral lower extremity edema, will have shortness of breath and chest tightness only when he drinks a lot of fluids.  Lungs are clear to auscultation.  No JVD.  Liver function tests were not elevated in April.  He does have a history of an elevated creatinine and his potassium was 5.7 ron 4/21.  He reports taking spironolactone  50 mg twice daily which was not on his med list.  I have instructed him to reduce this dose to 25 mg once daily, start Jardiance  10 mg daily which he had not started, and start the metoprolol  25 mg BID.  Patient is  amenable to getting his heart failure care here.  He will try to get the echo done, and follow-up with us  in a phone visit in the next couple of weeks to see how you are doing on this medications.  He will be going to [place] for [place] months on May 20th. He does have a physician there that he can go to should he need to.   -Get echo  -Dc coreg   -Start metoprolol  25mg  BID  -Start Jardiance  10mg  daily  -Decrease spironolactone  to 25mg  daily  -  Follow up via phone in the next couple of weeks, to send lab order should he need to get labs checked (pt will be in [place]  and then [place])    Hyperkalemia Pt reported taking Spironolactone  50mg  BID. K has been elevated at ~  5.5-5.7 since November last year. He denies any palpitations. Urinating fine. Cr is 2.1 which has also increased since November. On dispense records it seems he was intermittently on Spironolactone  from 2023-July 2024 and then consistently taking it since November. Could be that hyperkalemia is related to Spironolactone .   -Decrease Spironolactone  to 25mg  daily  -FU with RFP at next visit   Jose Ngo, MD [facility] Internal Medicine, PGY-1 Phone: [phone] Date 02/24/2024 Time 11:24 AM

## 2024-02-23 NOTE — Patient Instructions (Signed)
 Thank you, Mr.Jahden Levit for allowing us  to provide your care today.  I have ordered the following medication/changed the following medications:   Start the following medications: Meds ordered this encounter  Medications   empagliflozin  (JARDIANCE ) 10 MG TABS tablet    Sig: Take 1 tablet (10 mg total) by mouth daily.    Dispense:  90 tablet    Refill:  3   spironolactone (ALDACTONE) 25 MG tablet    Sig: Take 1 tablet (25 mg total) by mouth 2 (two) times daily.    Dispense:  90 tablet    Refill:  3     Follow up: 1 month     Remember:   -Take spironolactone 25mg  daily (NOT 50mg  twice a day)  -Take Jardiace 10mg  daily  -Start metoprolol  25 mg daily   Lets do a phone visit before you leave for your trip.  Weigh yourself daily, call us  if you gain 3lbs in 24 hours, or 5 lbs in 1 week.   Should you have any questions or concerns please call the internal medicine clinic at [phone].    Jose Ngo, MD [facility]

## 2024-02-23 NOTE — Assessment & Plan Note (Addendum)
 Patient has a history of heart failure with reduced ejection fraction.  Last echo was done in 20 1220 ejection fraction was 32%.  Since then he has not gotten a repeat echo.  Is wondering if he was to take metoprolol .  He was on Coreg  but this was stopped because of a potential interaction with his cancer therapy.  He reports having a cardiologist and that his cardiologist recently told him not to get the echo.  A follow-up BNP was done and it was 51.9 he does not have bilateral lower extremity edema, will have shortness of breath and chest tightness only when he drinks a lot of fluids.  Lungs are clear to auscultation.  No JVD.  Liver function tests were not elevated in April.  He does have a history of an elevated creatinine and his potassium was 5.7 ron 4/21.  He reports taking spironolactone  50 mg twice daily which was not on his med list.  I have instructed him to reduce this dose to 25 mg once daily, start Jardiance  10 mg daily which he had not started, and start the metoprolol  25 mg BID.  Patient is amenable to getting his heart failure care here.  He will try to get the echo done, and follow-up with us  in a phone visit in the next couple of weeks to see how you are doing on this medications.  He will be going to [place] for 3 months on May 20th. He does have a physician there that he can go to should he need to.   -Get echo  -Dc coreg   -Start metoprolol  25mg  BID  -Start Jardiance  10mg  daily  -Decrease spironolactone  to 25mg  daily  -Follow up via phone in the next couple of weeks, to send lab order should he need to get labs checked (pt will be in [place]  and then [place])

## 2024-02-24 NOTE — Assessment & Plan Note (Signed)
 Pt reported taking Spironolactone  50mg  BID. K has been elevated at ~  5.5-5.7 since November last year. He denies any palpitations. Urinating fine. Cr is 2.1 which has also increased since November. On dispense records it seems he was intermittently on Spironolactone  from 2023-July 2024 and then consistently taking it since November. Could be that hyperkalemia is related to Spironolactone .   -Decrease Spironolactone  to 25mg  daily  -FU with RFP at next visit

## 2024-02-24 NOTE — Progress Notes (Signed)
 Internal Medicine Clinic Attending  Case discussed with the resident at the time of the visit.  We reviewed the resident's history and exam and pertinent patient test results.  I agree with the assessment, diagnosis, and plan of care documented in the resident's note.

## 2024-02-27 ENCOUNTER — Other Ambulatory Visit: Payer: Self-pay

## 2024-02-27 ENCOUNTER — Other Ambulatory Visit (HOSPITAL_COMMUNITY): Payer: Self-pay

## 2024-02-28 ENCOUNTER — Other Ambulatory Visit: Payer: Self-pay

## 2024-02-28 ENCOUNTER — Other Ambulatory Visit: Payer: Self-pay | Admitting: Hematology

## 2024-02-28 ENCOUNTER — Other Ambulatory Visit (HOSPITAL_COMMUNITY): Payer: Self-pay

## 2024-02-28 DIAGNOSIS — C61 Malignant neoplasm of prostate: Secondary | ICD-10-CM

## 2024-02-28 MED ORDER — ORGOVYX 120 MG PO TABS
120.0000 mg | ORAL_TABLET | Freq: Every day | ORAL | 0 refills | Status: DC
  Filled 2024-02-28 (×2): qty 30, 30d supply, fill #0

## 2024-02-28 NOTE — Progress Notes (Signed)
 Specialty Pharmacy Refill Coordination Note  Carlos Martin is a 68 y.o. male contacted today regarding refills of specialty medication(s) Relugolix  (ORGOVYX )   Patient requested Delivery   Delivery date: 03/02/24   Verified address: [address], [place], [place] [address]   Medication will be filled on 03/01/24.   Spoke to patient and updated responses from what was originally on questionnaire based on our conversation. He has the full supply left from what was sent in April and will need another month supply to take with him when he leaves the country 5/20. He will not start medication until 7/20. Timed out next refill call until when he should need next based on start date and qty dispensed.

## 2024-02-28 NOTE — Progress Notes (Signed)
 Earliest fill on ins 5/17. Patient will pick up 5/19 instead of delivery.

## 2024-03-01 ENCOUNTER — Other Ambulatory Visit (HOSPITAL_COMMUNITY): Payer: Self-pay

## 2024-03-01 ENCOUNTER — Other Ambulatory Visit: Payer: Self-pay

## 2024-03-05 ENCOUNTER — Other Ambulatory Visit: Payer: Self-pay

## 2024-03-05 ENCOUNTER — Other Ambulatory Visit (HOSPITAL_COMMUNITY): Payer: Self-pay

## 2024-04-30 ENCOUNTER — Other Ambulatory Visit: Payer: Self-pay | Admitting: Nurse Practitioner

## 2024-04-30 ENCOUNTER — Other Ambulatory Visit: Payer: Self-pay

## 2024-05-01 ENCOUNTER — Other Ambulatory Visit: Payer: Self-pay

## 2024-05-02 ENCOUNTER — Telehealth: Payer: Self-pay | Admitting: *Deleted

## 2024-05-02 NOTE — Telephone Encounter (Signed)
 Reminder letter mailed to patient for his ECHO appointment 05/17/2024 @ 11:00 am at [facility]. Unable to keep appointment patient is to call [phone].  Letter mailed.

## 2024-05-04 ENCOUNTER — Other Ambulatory Visit: Payer: Self-pay

## 2024-05-04 ENCOUNTER — Other Ambulatory Visit (HOSPITAL_COMMUNITY): Payer: Self-pay

## 2024-05-04 ENCOUNTER — Other Ambulatory Visit: Payer: Self-pay | Admitting: Hematology

## 2024-05-04 DIAGNOSIS — C61 Malignant neoplasm of prostate: Secondary | ICD-10-CM

## 2024-05-04 MED ORDER — ABIRATERONE ACETATE 250 MG PO TABS
1000.0000 mg | ORAL_TABLET | Freq: Every day | ORAL | 0 refills | Status: DC
  Filled 2024-05-04: qty 360, 90d supply, fill #0
  Filled 2024-05-08: qty 120, 30d supply, fill #0

## 2024-05-06 ENCOUNTER — Other Ambulatory Visit: Payer: Self-pay | Admitting: Nurse Practitioner

## 2024-05-06 DIAGNOSIS — C61 Malignant neoplasm of prostate: Secondary | ICD-10-CM

## 2024-05-06 DIAGNOSIS — D509 Iron deficiency anemia, unspecified: Secondary | ICD-10-CM

## 2024-05-06 NOTE — Assessment & Plan Note (Deleted)
 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 Bone scan and spinal MRI showed diffuse bone mets, PSA 524. Bone biopsy confirmed metastatic prostate cancer.  -The incurable nature of his disease and treatment options were discussed along with the need for long term treatment -he started ADT in hospital and his back pain has much improved, will continue ADT and change to Eligard on 7/8. He previouly had reservation about the therapy, after detailed discussion he agreed to start on 7/19.  -His PSMA scan showed multiple bone mets and pelvic node metastasis  -he has started systemic therapy with Zytiga and prednisone in June 2024, he is tolerating well overall.  Will continue. -Will monitor his PSA closely, it dropped to 1.2 in July 2024  -11/07/2023 - PSA level pending.  --patient asking about alternative treatments available for his prostate cancer. He is unhappy about having to take pills indefinitely. Specifically, he has asked about radiation or surgery as possible alternatives to the pills he is taking.  -We previously reviewed the incurable nature of his metastatic disease, and the need for long-term treatment.  --specifically, we reviewed his PET scan from 04/2023 which did show multifocal bone metastases along with nodal metastases in the left pelvic side wall and left perirectal space. He had whole body bone scan done 03/2023 which showed multifocal osseous metastatic disease in ribs, sternum, scapula, spine, pelvis, and bilateral proximal femurs. He is not a good candidate for radiation, due to the multifocal quality of his disease. Surgery is also not good option as it would take away the need for eligard injections every 6 months, however, he would still need to take Zytiga ( or similar medication) everyday. -His bone metastases require bone-strengthening medication due to cancer in the bones, which can cause fractures. Dental  clearance needed before starting bone medication to prevent complications such as jaw necrosis. Again, explained benefits of bone medication in preventing fractures and risks of dental complications without clearance. -Will cancel Xgeva injection until dental clearance is obtained

## 2024-05-06 NOTE — Progress Notes (Deleted)
 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 Bone scan and spinal MRI showed diffuse bone mets, PSA 524. Bone biopsy confirmed metastatic prostate cancer.  -The incurable nature of his disease and treatment options were discussed along with the need for long term treatment -he started ADT in hospital and his back pain has much improved, will continue ADT and change to Eligard  on 7/8. He previouly had reservation about the therapy, after detailed discussion he agreed to start on 7/19.  -His PSMA scan showed multiple bone mets and pelvic node metastasis  -he has started systemic therapy with Zytiga  and prednisone  in June 2024, he is tolerating well overall.  Will continue. -Will monitor his PSA closely, it dropped to 1.2 in July 2024  -11/07/2023 - PSA level pending.  --patient asking about alternative treatments available for his prostate cancer. He is unhappy about having to take pills indefinitely. Specifically, he has asked about radiation or surgery as possible alternatives to the pills he is taking.  -We previously reviewed the incurable nature of his metastatic disease, and the need for long-term treatment.  --specifically, we reviewed his PET scan from 04/2023 which did show multifocal bone metastases along with nodal metastases in the left pelvic side wall and left perirectal space. He had whole body bone scan done 03/2023 which showed multifocal osseous metastatic disease in ribs, sternum, scapula, spine, pelvis, and bilateral proximal femurs. He is not a good candidate for radiation, due to  the multifocal quality of his disease. Surgery is also not good option as it would take away the need for eligard  injections every 6 months, however, he would still need to take Zytiga  ( or similar medication) everyday. -His bone metastases require bone-strengthening medication due to cancer in the bones, which can cause fractures. Dental clearance needed before starting bone medication to prevent complications such as jaw necrosis. Again, explained benefits of bone medication in preventing fractures and risks of dental complications without clearance. -Will cancel Xgeva  injection until dental clearance is obtained    The patient understands the plans discussed today and is in agreement with them.  He knows to contact our office if he develops concerns prior to his next appointment.  I provided *** minutes of face-to-face time during this encounter and > 50% was spent counseling as documented under my assessment and plan.    Powell FORBES Lessen, NP  [facility] - A DEPT OF [facility] [address] [place] [address] Dept: [phone] Dept Fax: [phone]   No orders of the defined types were placed in this encounter.     CHIEF COMPLAINT:  CC: Prostate cancer metastatic to bone  Current Treatment: Eligard  injection every 6 months, abiraterone , and prednisone   INTERVAL HISTORY:  Carlos Martin is here today for repeat clinical assessment.  He was last seen by me on 02/06/2024.  We are waiting on dental clearance for him to receive Xgeva  injection for his bone metastatic disease.  He denies fevers or chills. He denies pain. His appetite is good. His weight {Weight change:10426}.  I have  reviewed the past medical history, past surgical history, social history and family history with the patient and they are unchanged from previous note.  ALLERGIES:  has no known allergies.  MEDICATIONS:  Current Outpatient Medications   Medication Sig Dispense Refill   abiraterone  acetate (ZYTIGA ) 250 MG tablet Take 4 tablets (1,000 mg total) by mouth daily. Take on an empty stomach 1 hour before or 2 hours after a meal 360 tablet 0   amLODipine  (NORVASC ) 5 MG tablet Take 5 mg by mouth daily.     atorvastatin  (LIPITOR) 40 MG tablet Take 40 mg by mouth daily.     bisacodyl  5 MG EC tablet Take 2 tablets (10 mg total) by mouth daily as needed for moderate constipation. 30 tablet 0   empagliflozin  (JARDIANCE ) 10 MG TABS tablet Take 1 tablet (10 mg total) by mouth daily. 90 tablet 3   latanoprost  (XALATAN ) 0.005 % ophthalmic solution INSTILL 1 DROP INTO EACH EYE ONCE DAILY 3 mL 3   metFORMIN  (GLUCOPHAGE ) 1000 MG tablet Take 1,000 mg by mouth 2 (two) times daily.     metoprolol  tartrate (LOPRESSOR ) 25 MG tablet Take 1 tablet (25 mg total) by mouth 2 (two) times daily. 180 tablet 3   Multiple Vitamin (MULTIVITAMIN WITH MINERALS) TABS tablet Take 1 tablet by mouth daily.     multivitamin-lutein  (OCUVITE-LUTEIN ) CAPS capsule Take 1 capsule by mouth daily.     predniSONE  (DELTASONE ) 5 MG tablet Take 1 tablet by mouth once daily with breakfast 90 tablet 0   relugolix  (ORGOVYX ) 120 MG tablet Take 1 tablet (120 mg total) by mouth daily. Take 3 tablets (360 mg total) by mouth on day 1, then take 1 tablet (120 mg total) daily thereafter. Start on 05/06/2024 30 tablet 0   spironolactone  (ALDACTONE ) 25 MG tablet Take 1 tablet (25 mg total) by mouth 2 (two) times daily. 90 tablet 3   No current facility-administered medications for this visit.    HISTORY OF PRESENT ILLNESS:   Oncology History Overview Note   Cancer Staging  Prostate cancer metastatic to bone [facility]) Staging form: Prostate, AJCC 8th Edition - Clinical stage from 03/28/2023: Stage IVB (cTX, cN0, pM1, PSA: 524) - Signed by Lanny Callander, MD on 04/08/2023 Prostate specific antigen (PSA) range: 20 or greater     Prostate cancer metastatic to bone (HCC)  03/24/2023 Imaging   NM BONE  SCAN WHOLE BODY   IMPRESSION: Multifocal osseous metastatic disease. Areas include numerous bilateral ribs, sternum, scapula, spine, pelvis and proximal femurs     03/28/2023 Cancer Staging   Staging form: Prostate, AJCC 8th Edition - Clinical stage from 03/28/2023: Stage IVB (cTX, cN0, pM1, PSA: 524) - Signed by Lanny Callander, MD on 04/08/2023 Prostate specific antigen (PSA) range: 20 or greater   03/28/2023 Pathology Results     FINAL MICROSCOPIC DIAGNOSIS:  A. RIGHT ILIAC, BONE BIOPSY: Metastatic poorly differentiated prostatic adenocarcinoma   04/08/2023 Initial Diagnosis   Prostate cancer metastatic to bone [facility])       REVIEW OF SYSTEMS:   Constitutional: Denies fevers, chills or abnormal weight loss Eyes: Denies blurriness of vision Ears, nose, mouth, throat, and face: Denies mucositis or sore throat Respiratory: Denies cough, dyspnea or wheezes Cardiovascular: Denies palpitation, chest discomfort or lower extremity swelling Gastrointestinal:  Denies nausea, heartburn or change in bowel habits Skin: Denies abnormal skin rashes Lymphatics: Denies new lymphadenopathy or easy bruising Neurological:Denies numbness, tingling or new weaknesses Behavioral/Psych: Mood is stable, no new changes  All other systems were  reviewed with the patient and are negative.   VITALS:  There were no vitals taken for this visit.  Wt Readings from Last 3 Encounters:  02/23/24 221 lb 6.4 oz (100.4 kg)  02/06/24 221 lb 14.4 oz (100.7 kg)  01/24/24 220 lb 4.8 oz (99.9 kg)    There is no height or weight on file to calculate BMI.  Performance status (ECOG): {CHL ONC [id number]  PHYSICAL EXAM:   GENERAL:alert, no distress and comfortable SKIN: skin color, texture, turgor are normal, no rashes or significant lesions EYES: normal, Conjunctiva are pink and non-injected, sclera clear OROPHARYNX:no exudate, no erythema and lips, buccal mucosa, and tongue normal  NECK: supple, thyroid normal  size, non-tender, without nodularity LYMPH:  no palpable lymphadenopathy in the cervical, axillary or inguinal LUNGS: clear to auscultation and percussion with normal breathing effort HEART: regular rate & rhythm and no murmurs and no lower extremity edema ABDOMEN:abdomen soft, non-tender and normal bowel sounds Musculoskeletal:no cyanosis of digits and no clubbing  NEURO: alert & oriented x 3 with fluent speech, no focal motor/sensory deficits  LABORATORY DATA:  I have reviewed the data as listed    Component Value Date/Time   NA 134 (L) 02/06/2024 1011   NA 140 04/04/2023 1042   K 5.7 (H) 02/06/2024 1011   CL 106 02/06/2024 1011   CO2 21 (L) 02/06/2024 1011   GLUCOSE 125 (H) 02/06/2024 1011   BUN 48 (H) 02/06/2024 1011   BUN 12 04/04/2023 1042   CREATININE 2.11 (H) 02/06/2024 1011   CALCIUM  10.3 02/06/2024 1011   PROT 8.1 02/06/2024 1011   ALBUMIN 4.5 02/06/2024 1011   AST 13 (L) 02/06/2024 1011   ALT 13 02/06/2024 1011   ALKPHOS 64 02/06/2024 1011   BILITOT 0.4 02/06/2024 1011   GFRNONAA 34 (L) 02/06/2024 1011   GFRAA >90 06/27/2013 1212    No results found for: SPEP, UPEP  Lab Results  Component Value Date   WBC 7.7 02/06/2024   NEUTROABS 5.4 02/06/2024   HGB 10.2 (L) 02/06/2024   HCT 30.7 (L) 02/06/2024   MCV 74.7 (L) 02/06/2024   PLT 189 02/06/2024      Chemistry      Component Value Date/Time   NA 134 (L) 02/06/2024 1011   NA 140 04/04/2023 1042   K 5.7 (H) 02/06/2024 1011   CL 106 02/06/2024 1011   CO2 21 (L) 02/06/2024 1011   BUN 48 (H) 02/06/2024 1011   BUN 12 04/04/2023 1042   CREATININE 2.11 (H) 02/06/2024 1011      Component Value Date/Time   CALCIUM  10.3 02/06/2024 1011   ALKPHOS 64 02/06/2024 1011   AST 13 (L) 02/06/2024 1011   ALT 13 02/06/2024 1011   BILITOT 0.4 02/06/2024 1011       RADIOGRAPHIC STUDIES: I have personally reviewed the radiological images as listed and agreed with the findings in the report. No results found.

## 2024-05-07 ENCOUNTER — Inpatient Hospital Stay: Payer: Medicare HMO | Admitting: Nurse Practitioner

## 2024-05-07 ENCOUNTER — Telehealth: Payer: Self-pay

## 2024-05-07 ENCOUNTER — Inpatient Hospital Stay: Payer: Medicare HMO

## 2024-05-07 ENCOUNTER — Inpatient Hospital Stay: Payer: Medicare HMO | Attending: Nurse Practitioner

## 2024-05-07 DIAGNOSIS — C61 Malignant neoplasm of prostate: Secondary | ICD-10-CM

## 2024-05-07 NOTE — Telephone Encounter (Signed)
 Called patient because he didn't show up for his appointment with  Heather Boscia nP at 1030. Was not able to leave a message due to no vm set up. Will no show patient for all his appointments.

## 2024-05-08 ENCOUNTER — Other Ambulatory Visit (HOSPITAL_COMMUNITY): Payer: Self-pay

## 2024-05-08 ENCOUNTER — Other Ambulatory Visit: Payer: Self-pay

## 2024-05-10 ENCOUNTER — Other Ambulatory Visit: Payer: Self-pay

## 2024-05-14 ENCOUNTER — Other Ambulatory Visit: Payer: Self-pay

## 2024-05-17 ENCOUNTER — Ambulatory Visit (HOSPITAL_COMMUNITY): Admission: RE | Admit: 2024-05-17 | Source: Ambulatory Visit

## 2024-05-18 ENCOUNTER — Other Ambulatory Visit: Payer: Self-pay

## 2024-06-07 ENCOUNTER — Other Ambulatory Visit: Payer: Self-pay

## 2024-06-09 ENCOUNTER — Other Ambulatory Visit: Payer: Self-pay | Admitting: Hematology

## 2024-06-11 ENCOUNTER — Other Ambulatory Visit: Payer: Self-pay

## 2024-06-11 ENCOUNTER — Other Ambulatory Visit (HOSPITAL_COMMUNITY): Payer: Self-pay

## 2024-06-12 ENCOUNTER — Encounter: Payer: Self-pay | Admitting: Hematology

## 2024-06-13 DIAGNOSIS — H401132 Primary open-angle glaucoma, bilateral, moderate stage: Secondary | ICD-10-CM | POA: Diagnosis not present

## 2024-06-15 ENCOUNTER — Other Ambulatory Visit: Payer: Self-pay

## 2024-06-15 ENCOUNTER — Telehealth: Payer: Self-pay | Admitting: Genetic Counselor

## 2024-06-15 NOTE — Telephone Encounter (Signed)
 I messaged Dr. Lanny to let them know that Mr. Morell is recommended to have genetic testing due to their personal history of metastatic prostate cancer.  Harald Quevedo, MS, [facility] Genetic Counselor [email or web address] (P) [phone]

## 2024-06-18 NOTE — Assessment & Plan Note (Signed)
 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 Bone scan and spinal MRI showed diffuse bone mets, PSA 524. Bone biopsy confirmed metastatic prostate cancer.  -I reviewed the incurable nature of his disease and treatment options and need for long treatment -he started ADT in hospital and his back pain has much improved, will continue ADT and change to Eligard  on 7/8. He previouly had reservation bout the therapy, after detailed discussion he agreed to start on 7/19.  -His PSMA scan showed multiple bone mets and pelvic node metastasis  -he has started systemic therapy with Zytiga  and prednisone  in June 2024, he is tolerating well overall.  Will continue. -Will monitor his PSA closely, it dropped to 1.2 in July 2024 and has been undetectable since Sep 2024 -We previously reviewed the incurable nature of his metastatic disease, and the need for long-term treatment.

## 2024-06-19 ENCOUNTER — Inpatient Hospital Stay

## 2024-06-19 ENCOUNTER — Encounter: Payer: Self-pay | Admitting: Hematology

## 2024-06-19 ENCOUNTER — Other Ambulatory Visit: Payer: Self-pay

## 2024-06-19 ENCOUNTER — Inpatient Hospital Stay: Attending: Nurse Practitioner | Admitting: Hematology

## 2024-06-19 VITALS — BP 124/68 | HR 85 | Temp 98.3°F | Resp 15 | Ht 69.0 in | Wt 220.1 lb

## 2024-06-19 DIAGNOSIS — Z7984 Long term (current) use of oral hypoglycemic drugs: Secondary | ICD-10-CM | POA: Diagnosis not present

## 2024-06-19 DIAGNOSIS — C61 Malignant neoplasm of prostate: Secondary | ICD-10-CM

## 2024-06-19 DIAGNOSIS — N1831 Chronic kidney disease, stage 3a: Secondary | ICD-10-CM | POA: Insufficient documentation

## 2024-06-19 DIAGNOSIS — Z7952 Long term (current) use of systemic steroids: Secondary | ICD-10-CM | POA: Diagnosis not present

## 2024-06-19 DIAGNOSIS — C775 Secondary and unspecified malignant neoplasm of intrapelvic lymph nodes: Secondary | ICD-10-CM | POA: Insufficient documentation

## 2024-06-19 DIAGNOSIS — C7951 Secondary malignant neoplasm of bone: Secondary | ICD-10-CM | POA: Insufficient documentation

## 2024-06-19 DIAGNOSIS — D509 Iron deficiency anemia, unspecified: Secondary | ICD-10-CM | POA: Insufficient documentation

## 2024-06-19 DIAGNOSIS — Z9079 Acquired absence of other genital organ(s): Secondary | ICD-10-CM | POA: Insufficient documentation

## 2024-06-19 DIAGNOSIS — Z79899 Other long term (current) drug therapy: Secondary | ICD-10-CM | POA: Diagnosis not present

## 2024-06-19 DIAGNOSIS — I129 Hypertensive chronic kidney disease with stage 1 through stage 4 chronic kidney disease, or unspecified chronic kidney disease: Secondary | ICD-10-CM | POA: Diagnosis not present

## 2024-06-19 DIAGNOSIS — E1122 Type 2 diabetes mellitus with diabetic chronic kidney disease: Secondary | ICD-10-CM | POA: Insufficient documentation

## 2024-06-19 LAB — CMP (CANCER CENTER ONLY)
ALT: 16 U/L (ref 0–44)
AST: 14 U/L — ABNORMAL LOW (ref 15–41)
Albumin: 4.2 g/dL (ref 3.5–5.0)
Alkaline Phosphatase: 90 U/L (ref 38–126)
Anion gap: 8 (ref 5–15)
BUN: 29 mg/dL — ABNORMAL HIGH (ref 8–23)
CO2: 20 mmol/L — ABNORMAL LOW (ref 22–32)
Calcium: 9.6 mg/dL (ref 8.9–10.3)
Chloride: 107 mmol/L (ref 98–111)
Creatinine: 2.09 mg/dL — ABNORMAL HIGH (ref 0.61–1.24)
GFR, Estimated: 34 mL/min — ABNORMAL LOW (ref >60)
Glucose, Bld: 143 mg/dL — ABNORMAL HIGH (ref 70–99)
Potassium: 5.1 mmol/L (ref 3.5–5.1)
Sodium: 135 mmol/L (ref 135–145)
Total Bilirubin: 0.2 mg/dL (ref 0.0–1.2)
Total Protein: 7.7 g/dL (ref 6.5–8.1)

## 2024-06-19 LAB — CBC WITH DIFFERENTIAL (CANCER CENTER ONLY)
Abs Immature Granulocytes: 0.02 K/uL (ref 0.00–0.07)
Basophils Absolute: 0 K/uL (ref 0.0–0.1)
Basophils Relative: 1 %
Eosinophils Absolute: 0.2 K/uL (ref 0.0–0.5)
Eosinophils Relative: 3 %
HCT: 33.1 % — ABNORMAL LOW (ref 39.0–52.0)
Hemoglobin: 10.8 g/dL — ABNORMAL LOW (ref 13.0–17.0)
Immature Granulocytes: 0 %
Lymphocytes Relative: 29 %
Lymphs Abs: 2.1 K/uL (ref 0.7–4.0)
MCH: 24.1 pg — ABNORMAL LOW (ref 26.0–34.0)
MCHC: 32.6 g/dL (ref 30.0–36.0)
MCV: 73.9 fL — ABNORMAL LOW (ref 80.0–100.0)
Monocytes Absolute: 0.5 K/uL (ref 0.1–1.0)
Monocytes Relative: 6 %
Neutro Abs: 4.3 K/uL (ref 1.7–7.7)
Neutrophils Relative %: 61 %
Platelet Count: 197 K/uL (ref 150–400)
RBC: 4.48 MIL/uL (ref 4.22–5.81)
RDW: 13.9 % (ref 11.5–15.5)
WBC Count: 7.1 K/uL (ref 4.0–10.5)
nRBC: 0 % (ref 0.0–0.2)

## 2024-06-19 LAB — FERRITIN: Ferritin: 146 ng/mL (ref 24–336)

## 2024-06-19 MED ORDER — ABIRATERONE ACETATE 250 MG PO TABS
1000.0000 mg | ORAL_TABLET | Freq: Every day | ORAL | 2 refills | Status: DC
  Filled 2024-06-19 – 2024-06-21 (×2): qty 120, 30d supply, fill #0
  Filled 2024-07-19: qty 120, 30d supply, fill #1
  Filled 2024-08-15: qty 120, 30d supply, fill #2

## 2024-06-19 MED ORDER — LEUPROLIDE ACETATE (6 MONTH) 45 MG ~~LOC~~ KIT
45.0000 mg | PACK | Freq: Once | SUBCUTANEOUS | Status: AC
  Administered 2024-06-19: 45 mg via SUBCUTANEOUS
  Filled 2024-06-19: qty 45

## 2024-06-19 NOTE — Progress Notes (Signed)
 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 Bone scan and spinal MRI showed diffuse bone mets, PSA 524. Bone biopsy confirmed metastatic prostate cancer.  -I reviewed the incurable nature of his disease and treatment options and need for long treatment -he started ADT in hospital and his back pain has much improved, will continue ADT and change to Eligard  on 7/8. He previouly had reservation bout the therapy, after detailed discussion he agreed to start on 7/19.  -His PSMA scan showed multiple bone mets and pelvic node metastasis  -he has started systemic therapy with Zytiga  and prednisone  in June 2024, he is tolerating well overall.  Will continue. -Will monitor his PSA closely, it dropped to 1.2 in July 2024 and has been undetectable since Sep 2024 -We previously reviewed the incurable nature of his metastatic disease, and the need for long-term treatment.   Assessment & Plan Metastatic prostate cancer Metastatic prostate cancer is well-controlled with the current treatment regimen. Tumor marker was undetectable in April, indicating effective disease management. He is on Eligard  injection (anti-testosterone  treatment) every six months and oral medication regimen including abiraterone  and prednisone . Due to his recent travel, he took Orgovyx  for 6 weeks which he will stop now  - Administer Eligard  injection today - Continue  abiraterone  and prednisone  regimen - Refill abiraterone  prescription for one month with two additional refills - Schedule next oncology follow-up in three months - Schedule next Aligard injection in six months - Provide letter for dental clearance for bone-strengthening medication  Chronic kidney disease stage 3 Potassium levels are consistently high, requiring dietary management. - Maintain low potassium diet - Follow up with nephrologist to preserve kidney function  Microcytic anemia Mild anemia with hemoglobin at 10.8, which is an improvement.  He has chronic microcytosis, raising suspicion for thalassemia, though he denies any known history. - Consider testing for thalassemia in future visits if clinically indicated  Hyperkalemia Hyperkalemia is a concern due to consistently high potassium levels. Dietary management is necessary to control potassium intake. - Maintain low potassium diet - Consult with nephrologist for dietary recommendations   Plan - He is clinically doing well.  Will proceed Eligard  45 mg injection today and continue every 6 months - He will stop Orgovyx  - He will continue abiraterone  and prednisone  - He agrees to find a dentist and I will give him the dental clearance form for Xgeva .  Will not start Xgeva  injection until he gets dental clearance. - Lab and follow-up in 3 months  SUMMARY OF ONCOLOGIC HISTORY: Oncology History Overview Note   Cancer Staging  Prostate cancer metastatic to bone [facility]) Staging form: Prostate, AJCC 8th Edition - Clinical stage from 03/28/2023: Stage IVB (cTX, cN0, pM1, PSA: 524) - Signed by Carlos Callander, MD on 04/08/2023 Prostate specific antigen (PSA) range: 20 or greater     Prostate cancer metastatic to bone (HCC)  03/24/2023 Imaging   NM BONE SCAN WHOLE BODY   IMPRESSION: Multifocal osseous metastatic  disease. Areas include numerous bilateral ribs, sternum, scapula, spine, pelvis and proximal femurs     03/28/2023 Cancer  Staging   Staging form: Prostate, AJCC 8th Edition - Clinical stage from 03/28/2023: Stage IVB (cTX, cN0, pM1, PSA: 524) - Signed by Carlos Callander, MD on 04/08/2023 Prostate specific antigen (PSA) range: 20 or greater   03/28/2023 Pathology Results     FINAL MICROSCOPIC DIAGNOSIS:  A. RIGHT ILIAC, BONE BIOPSY: Metastatic poorly differentiated prostatic adenocarcinoma   04/08/2023 Initial Diagnosis   Prostate cancer metastatic to bone [facility])      Discussed the use of AI scribe software for clinical note transcription with the patient, who gave verbal consent to proceed.  History of Present Illness Carlos Martin is a 68 year old male with metastatic prostate cancer who presents for follow-up.  He is undergoing treatment with Aligard injections, abiraterone , and prednisone  without pain or concerns. His tumor marker was undetectable in April. He has a few doses of his oral medication left and plans to refill his prescription. Recent blood tests showed a hemoglobin of 10.8, with other blood counts reported as normal. He is not aware of having thalassemia.     All other systems were reviewed with the patient and are negative.  MEDICAL HISTORY:  Past Medical History:  Diagnosis Date   CKD stage 3a, GFR 45-59 ml/min (HCC)    Diabetes (HCC) 07/19/2023   Elevated creatine kinase level    Hypertension    Prostate cancer metastatic to bone [facility])     SURGICAL HISTORY: Past Surgical History:  Procedure Laterality Date   COLONOSCOPY  2024   CYSTOSCOPY N/A 05/12/2023   Procedure: CYSTOSCOPY;  Surgeon: Renda Glance, MD;  Location: [facility];  Service: Urology;  Laterality: N/A;   NO PAST SURGERIES     TRANSURETHRAL RESECTION OF PROSTATE N/A 05/12/2023   Procedure: TRANSURETHRAL RESECTION OF THE PROSTATE (TURP);  Surgeon: Renda Glance, MD;  Location: [facility];  Service: Urology;  Laterality: N/A;  90 MINUTES NEEDED FOR CASE    I have reviewed the social history and family history with the  patient and they are unchanged from previous note.  ALLERGIES:  has no known allergies.  MEDICATIONS:  Current Outpatient Medications  Medication Sig Dispense Refill   amLODipine  (NORVASC ) 5 MG tablet Take 5 mg by mouth daily.     atorvastatin  (LIPITOR) 40 MG tablet Take 40 mg by mouth daily.     bisacodyl  5 MG EC tablet Take 2 tablets (10 mg total) by mouth daily as needed for moderate constipation. 30 tablet 0   empagliflozin  (JARDIANCE ) 10 MG TABS tablet Take 1 tablet (10 mg total) by mouth daily. 90 tablet 3   latanoprost  (XALATAN ) 0.005 % ophthalmic solution INSTILL 1 DROP INTO EACH EYE ONCE DAILY 3 mL 3   metFORMIN  (GLUCOPHAGE ) 1000 MG tablet Take 1,000 mg by mouth 2 (two) times daily.     metoprolol  tartrate (LOPRESSOR ) 25 MG tablet Take 1 tablet (25 mg total) by mouth 2 (two) times daily. 180 tablet 3   Multiple Vitamin (MULTIVITAMIN WITH MINERALS) TABS tablet Take 1 tablet by mouth daily.     multivitamin-lutein  (OCUVITE-LUTEIN ) CAPS capsule Take 1 capsule by mouth daily.     predniSONE  (DELTASONE ) 5 MG tablet Take 1 tablet by mouth once daily with breakfast 90 tablet 0   spironolactone  (ALDACTONE ) 25 MG tablet Take 1 tablet (25 mg total) by mouth 2 (two) times daily. 90 tablet 3   abiraterone  acetate (ZYTIGA ) 250 MG tablet  Take 4 tablets (1,000 mg total) by mouth daily. Take on an empty stomach 1 hour before or 2 hours after a meal 120 tablet 2   No current facility-administered medications for this visit.    PHYSICAL EXAMINATION: ECOG PERFORMANCE STATUS: 0 - Asymptomatic  Vitals:   06/19/24 1040  BP: 124/68  Pulse: 85  Resp: 15  Temp: 98.3 F (36.8 C)  SpO2: 98%   Wt Readings from Last 3 Encounters:  06/19/24 220 lb 1.6 oz (99.8 kg)  02/23/24 221 lb 6.4 oz (100.4 kg)  02/06/24 221 lb 14.4 oz (100.7 kg)     GENERAL:alert, no distress and comfortable SKIN: skin color, texture, turgor are normal, no rashes or significant lesions EYES: normal, Conjunctiva are pink  and non-injected, sclera clear NECK: supple, thyroid normal size, non-tender, without nodularity LYMPH:  no palpable lymphadenopathy in the cervical, axillary  LUNGS: clear to auscultation and percussion with normal breathing effort HEART: regular rate & rhythm and no murmurs and no lower extremity edema ABDOMEN:abdomen soft, non-tender and normal bowel sounds Musculoskeletal:no cyanosis of digits and no clubbing  NEURO: alert & oriented x 3 with fluent speech, no focal motor/sensory deficits  Physical Exam    LABORATORY DATA:  I have reviewed the data as listed    Latest Ref Rng & Units 06/19/2024   10:19 AM 02/06/2024   10:11 AM 11/07/2023   12:26 PM  CBC  WBC 4.0 - 10.5 K/uL 7.1  7.7  6.2   Hemoglobin 13.0 - 17.0 g/dL 89.1  89.7  89.6   Hematocrit 39.0 - 52.0 % 33.1  30.7  31.6   Platelets 150 - 400 K/uL 197  189  154         Latest Ref Rng & Units 06/19/2024   10:19 AM 02/06/2024   10:11 AM 01/27/2024    9:48 AM  CMP  Glucose 70 - 99 mg/dL 856  874  882   BUN 8 - 23 mg/dL 29  48  40   Creatinine 0.61 - 1.24 mg/dL 7.90  7.88  7.89   Sodium 135 - 145 mmol/L 135  134  137   Potassium 3.5 - 5.1 mmol/L 5.1  5.7  5.5   Chloride 98 - 111 mmol/L 107  106  108   CO2 22 - 32 mmol/L 20  21  19    Calcium  8.9 - 10.3 mg/dL 9.6  89.6  9.8   Total Protein 6.5 - 8.1 g/dL 7.7  8.1    Total Bilirubin 0.0 - 1.2 mg/dL 0.2  0.4    Alkaline Phos 38 - 126 U/L 90  64    AST 15 - 41 U/L 14  13    ALT 0 - 44 U/L 16  13        RADIOGRAPHIC STUDIES: I have personally reviewed the radiological images as listed and agreed with the findings in the report. No results found.    Orders Placed This Encounter  Procedures   Hgb Fractionation [place]    Standing Status:   Future    Expected Date:   09/18/2024    Expiration Date:   06/19/2025   All questions were answered. The patient knows to call the clinic with any problems, questions or concerns. No barriers to learning was detected. The total  time spent in the appointment was 30 minutes, including review of chart and various tests results, discussions about plan of care and coordination of care plan     Onita Mattock,  MD 06/19/2024

## 2024-06-19 NOTE — Addendum Note (Signed)
 Addended by: LANNY CALLANDER on: 06/19/2024 03:02 PM   Modules accepted: Orders

## 2024-06-20 LAB — PROSTATE-SPECIFIC AG, SERUM (LABCORP): Prostate Specific Ag, Serum: <0.1 ng/mL (ref 0.0–4.0)

## 2024-06-21 ENCOUNTER — Other Ambulatory Visit: Payer: Self-pay

## 2024-06-21 ENCOUNTER — Other Ambulatory Visit (HOSPITAL_COMMUNITY): Payer: Self-pay

## 2024-06-25 ENCOUNTER — Other Ambulatory Visit: Payer: Self-pay

## 2024-06-25 NOTE — Progress Notes (Signed)
 Specialty Pharmacy Refill Coordination Note  Ricki Clack is a 68 y.o. male contacted today regarding refills of specialty medication(s) Abiraterone  Acetate (ZYTIGA )   Patient requested Delivery   Delivery date: 06/27/24   Verified address: [address], [place], [place] [address]   Medication will be filled on 06/26/24.

## 2024-06-26 ENCOUNTER — Other Ambulatory Visit: Payer: Self-pay

## 2024-07-19 ENCOUNTER — Encounter (INDEPENDENT_AMBULATORY_CARE_PROVIDER_SITE_OTHER): Payer: Self-pay

## 2024-07-19 ENCOUNTER — Other Ambulatory Visit: Payer: Self-pay | Admitting: Pharmacy Technician

## 2024-07-19 ENCOUNTER — Other Ambulatory Visit: Payer: Self-pay

## 2024-07-19 NOTE — Progress Notes (Signed)
 Specialty Pharmacy Refill Coordination Note  Carlos Martin is a 68 y.o. male contacted today regarding refills of specialty medication(s)   Abiraterone  Acetate (ZYTIGA )    Patient requested (Patient-Rptd) Delivery   Delivery date: 07/25/24 Verified address: (Patient-Rptd) [address], [address], [place], [place] [address]   Medication will be filled on 07/24/24.

## 2024-07-24 ENCOUNTER — Other Ambulatory Visit: Payer: Self-pay

## 2024-07-24 ENCOUNTER — Encounter: Payer: Self-pay | Admitting: Genetic Counselor

## 2024-08-14 ENCOUNTER — Other Ambulatory Visit (HOSPITAL_COMMUNITY): Payer: Self-pay

## 2024-08-15 ENCOUNTER — Other Ambulatory Visit: Payer: Self-pay

## 2024-08-15 ENCOUNTER — Other Ambulatory Visit (HOSPITAL_COMMUNITY): Payer: Self-pay

## 2024-08-15 NOTE — Progress Notes (Signed)
 Specialty Pharmacy Refill Coordination Note  Carlos Martin is a 68 y.o. male contacted today regarding refills of specialty medication(s) Abiraterone  Acetate (ZYTIGA )   Patient requested Delivery   Delivery date: 08/22/24   Verified address: [address], [place], [place] [address]   Medication will be filled on: 08/21/24

## 2024-08-15 NOTE — Progress Notes (Signed)
 Specialty Pharmacy Ongoing Clinical Assessment Note  Carlos Martin is a 68 y.o. male who is being followed by the specialty pharmacy service for RxSp Oncology   Patient's specialty medication(s) reviewed today: Abiraterone  Acetate (ZYTIGA )   Missed doses in the last 4 weeks: 1   Patient/Caregiver did not have any additional questions or concerns.   Therapeutic benefit summary: Patient is achieving benefit   Adverse events/side effects summary: No adverse events/side effects   Patient's therapy is appropriate to: Continue    Goals Addressed             This Visit's Progress    Slow Disease Progression       Patient is on track. Patient will maintain adherence. PSA remains undetectable as of 06/19/24         Follow up: 6 months  Carlos Martin Specialty Pharmacist

## 2024-08-21 ENCOUNTER — Other Ambulatory Visit: Payer: Self-pay

## 2024-08-30 ENCOUNTER — Inpatient Hospital Stay

## 2024-08-30 ENCOUNTER — Inpatient Hospital Stay: Admitting: Genetic Counselor

## 2024-09-04 ENCOUNTER — Other Ambulatory Visit (HOSPITAL_COMMUNITY): Payer: Self-pay

## 2024-09-11 ENCOUNTER — Other Ambulatory Visit: Payer: Self-pay

## 2024-09-11 ENCOUNTER — Other Ambulatory Visit: Payer: Self-pay | Admitting: Hematology

## 2024-09-11 DIAGNOSIS — C61 Malignant neoplasm of prostate: Secondary | ICD-10-CM

## 2024-09-12 ENCOUNTER — Other Ambulatory Visit: Payer: Self-pay

## 2024-09-12 ENCOUNTER — Encounter: Payer: Self-pay | Admitting: Hematology

## 2024-09-18 ENCOUNTER — Inpatient Hospital Stay: Attending: Nurse Practitioner

## 2024-09-18 ENCOUNTER — Other Ambulatory Visit: Payer: Self-pay

## 2024-09-18 ENCOUNTER — Encounter: Payer: Self-pay | Admitting: Hematology

## 2024-09-18 ENCOUNTER — Other Ambulatory Visit (HOSPITAL_COMMUNITY): Payer: Self-pay

## 2024-09-18 ENCOUNTER — Inpatient Hospital Stay: Admitting: Hematology

## 2024-09-18 DIAGNOSIS — N1832 Chronic kidney disease, stage 3b: Secondary | ICD-10-CM | POA: Diagnosis not present

## 2024-09-18 DIAGNOSIS — C7951 Secondary malignant neoplasm of bone: Secondary | ICD-10-CM | POA: Diagnosis not present

## 2024-09-18 DIAGNOSIS — C61 Malignant neoplasm of prostate: Secondary | ICD-10-CM | POA: Insufficient documentation

## 2024-09-18 DIAGNOSIS — Z79899 Other long term (current) drug therapy: Secondary | ICD-10-CM | POA: Insufficient documentation

## 2024-09-18 DIAGNOSIS — D509 Iron deficiency anemia, unspecified: Secondary | ICD-10-CM

## 2024-09-18 DIAGNOSIS — Z7952 Long term (current) use of systemic steroids: Secondary | ICD-10-CM | POA: Insufficient documentation

## 2024-09-18 LAB — CBC WITH DIFFERENTIAL/PLATELET
Abs Immature Granulocytes: 0.02 K/uL (ref 0.00–0.07)
Basophils Absolute: 0 K/uL (ref 0.0–0.1)
Basophils Relative: 0 %
Eosinophils Absolute: 0.1 K/uL (ref 0.0–0.5)
Eosinophils Relative: 1 %
HCT: 32.3 % — ABNORMAL LOW (ref 39.0–52.0)
Hemoglobin: 10.5 g/dL — ABNORMAL LOW (ref 13.0–17.0)
Immature Granulocytes: 0 %
Lymphocytes Relative: 25 %
Lymphs Abs: 1.9 K/uL (ref 0.7–4.0)
MCH: 23.5 pg — ABNORMAL LOW (ref 26.0–34.0)
MCHC: 32.5 g/dL (ref 30.0–36.0)
MCV: 72.3 fL — ABNORMAL LOW (ref 80.0–100.0)
Monocytes Absolute: 0.5 K/uL (ref 0.1–1.0)
Monocytes Relative: 7 %
Neutro Abs: 5 K/uL (ref 1.7–7.7)
Neutrophils Relative %: 67 %
Platelets: 216 K/uL (ref 150–400)
RBC: 4.47 MIL/uL (ref 4.22–5.81)
RDW: 14.8 % (ref 11.5–15.5)
WBC: 7.5 K/uL (ref 4.0–10.5)
nRBC: 0 % (ref 0.0–0.2)

## 2024-09-18 LAB — COMPREHENSIVE METABOLIC PANEL WITH GFR
ALT: 23 U/L (ref 0–44)
AST: 18 U/L (ref 15–41)
Albumin: 4.6 g/dL (ref 3.5–5.0)
Alkaline Phosphatase: 77 U/L (ref 38–126)
Anion gap: 12 (ref 5–15)
BUN: 41 mg/dL — ABNORMAL HIGH (ref 8–23)
CO2: 20 mmol/L — ABNORMAL LOW (ref 22–32)
Calcium: 9.8 mg/dL (ref 8.9–10.3)
Chloride: 105 mmol/L (ref 98–111)
Creatinine, Ser: 2.18 mg/dL — ABNORMAL HIGH (ref 0.61–1.24)
GFR, Estimated: 33 mL/min — ABNORMAL LOW (ref >60)
Glucose, Bld: 136 mg/dL — ABNORMAL HIGH (ref 70–99)
Potassium: 5.8 mmol/L — ABNORMAL HIGH (ref 3.5–5.1)
Sodium: 137 mmol/L (ref 135–145)
Total Bilirubin: 0.3 mg/dL (ref 0.0–1.2)
Total Protein: 7.6 g/dL (ref 6.5–8.1)

## 2024-09-18 LAB — PSA: Prostatic Specific Antigen: 0.11 ng/mL (ref 0.00–4.00)

## 2024-09-18 MED ORDER — PREDNISONE 5 MG PO TABS
5.0000 mg | ORAL_TABLET | Freq: Every day | ORAL | 1 refills | Status: DC
  Filled 2024-09-18 – 2024-09-19 (×2): qty 90, 90d supply, fill #0

## 2024-09-18 MED ORDER — ABIRATERONE ACETATE 250 MG PO TABS
1000.0000 mg | ORAL_TABLET | Freq: Every day | ORAL | 2 refills | Status: AC
  Filled 2024-09-18 – 2024-09-19 (×2): qty 120, 30d supply, fill #0
  Filled 2024-10-17: qty 120, 30d supply, fill #1
  Filled 2024-11-08 – 2024-11-19 (×5): qty 120, 30d supply, fill #2

## 2024-09-18 NOTE — Progress Notes (Signed)
 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 Bone scan and spinal MRI showed diffuse bone mets, PSA 524. Bone biopsy confirmed metastatic prostate cancer.  -I reviewed the incurable nature of his disease and treatment options and need for long treatment -he started ADT in hospital and his back pain has much improved, will continue ADT and change to Eligard  on 7/8. He previouly had reservation bout the therapy, after detailed discussion he agreed to start on 7/19.  -His PSMA scan showed multiple bone mets and pelvic node metastasis  -he has started systemic therapy with Zytiga  and prednisone  in June 2024, he is tolerating well overall.  Will continue. -Will monitor his PSA closely, it dropped to 1.2 in July 2024 and has been undetectable since Sep 2024 -We previously reviewed the incurable nature of his metastatic disease, and the need for long-term treatment.  -due to his CKD, he is not eligible for zometa   Assessment & Plan Metastatic prostate cancer Asymptomatic with normal energy and appetite. PSA undetectable since September 2025. Currently on Zytiga , prednisone , and Eligard  injection every six months. Not a candidate for bisphosphonate Zometa due to CKD. - Refilled Zytiga  and prednisone  - Continue Eligard  injection every six months - Encouraged vitamin D supplementation - Scheduled follow-up in three months for lab,  office visit, and next Eligard  injection  Chronic kidney disease stage 3B Stage 3B CKD. - Encouraged follow-up with nephrologist and PCP  Type 2 diabetes mellitus On Jardiance  and metformin .  Hypertension On metoprolol , spironolactone , and amlodipine .  Plan - He is clinically doing very well, tolerating treatment well, will continue abiraterone  and prednisone  - Lab results from today are still pending - Follow-up in 3 months with repeated lab and Eligard  injection   SUMMARY OF ONCOLOGIC HISTORY: Oncology History Overview Note   Cancer Staging  Prostate cancer metastatic to bone [facility]) Staging form: Prostate, AJCC 8th Edition - Clinical stage from 03/28/2023: Stage IVB (cTX, cN0, pM1, PSA: 524) - Signed by Lanny Callander, MD on 04/08/2023 Prostate specific antigen (PSA) range: 20 or greater     Prostate cancer metastatic to bone (HCC)  03/24/2023 Imaging   NM BONE SCAN WHOLE BODY   IMPRESSION: Multifocal osseous metastatic disease. Areas include numerous bilateral ribs, sternum, scapula, spine, pelvis and proximal femurs     03/28/2023 Cancer Staging   Staging form: Prostate, AJCC 8th Edition - Clinical stage from 03/28/2023: Stage IVB (cTX, cN0, pM1, PSA: 524) - Signed by Lanny Callander, MD on 04/08/2023 Prostate specific antigen (PSA) range: 20 or greater   03/28/2023 Pathology Results     FINAL MICROSCOPIC DIAGNOSIS:  A. RIGHT ILIAC, BONE BIOPSY: Metastatic poorly differentiated prostatic adenocarcinoma   04/08/2023 Initial Diagnosis   Prostate cancer metastatic to bone [facility])      Discussed the use of AI scribe software for clinical note transcription with the patient, who gave verbal consent to proceed.  History of Present Illness Carlos Martin is a 68 year old male  with metastatic prostate cancer who presents for follow-up.  He is asymptomatic with good energy and no pain, urinary symptoms, or bladder issues. He takes Zytiga  four tablets once daily with prednisone  and  receives Eligard  every six months, with the last injection in September 2025.  He has stage 3B chronic kidney disease, diabetes, and hypertension. He takes Jardiance , metoprolol , spironolactone , metformin , bisacodyl , amlodipine , and atorvastatin  and confirms adherence.     All other systems were reviewed with the patient and are negative.  MEDICAL HISTORY:  Past Medical History:  Diagnosis Date   CKD stage 3a, GFR 45-59 ml/min (HCC)    Diabetes (HCC) 07/19/2023   Elevated creatine kinase level    Hypertension    Prostate cancer metastatic to bone [facility])     SURGICAL HISTORY: Past Surgical History:  Procedure Laterality Date   COLONOSCOPY  2024   CYSTOSCOPY N/A 05/12/2023   Procedure: CYSTOSCOPY;  Surgeon: Renda Glance, MD;  Location: [facility];  Service: Urology;  Laterality: N/A;   NO PAST SURGERIES     TRANSURETHRAL RESECTION OF PROSTATE N/A 05/12/2023   Procedure: TRANSURETHRAL RESECTION OF THE PROSTATE (TURP);  Surgeon: Renda Glance, MD;  Location: [facility];  Service: Urology;  Laterality: N/A;  90 MINUTES NEEDED FOR CASE    I have reviewed the social history and family history with the patient and they are unchanged from previous note.  ALLERGIES:  has no known allergies.  MEDICATIONS:  Current Outpatient Medications  Medication Sig Dispense Refill   amLODipine  (NORVASC ) 5 MG tablet Take 5 mg by mouth daily.     atorvastatin  (LIPITOR) 40 MG tablet Take 40 mg by mouth daily.     bisacodyl  5 MG EC tablet Take 2 tablets (10 mg total) by mouth daily as needed for moderate constipation. 30 tablet 0   empagliflozin  (JARDIANCE ) 10 MG TABS tablet Take 1 tablet (10 mg total) by mouth daily. 90 tablet 3   latanoprost  (XALATAN ) 0.005 % ophthalmic solution INSTILL 1 DROP INTO EACH EYE ONCE DAILY 3 mL 3   metFORMIN  (GLUCOPHAGE ) 1000 MG tablet Take 1,000 mg by mouth 2 (two) times daily.     metoprolol  tartrate (LOPRESSOR ) 25 MG tablet Take 1 tablet (25 mg total) by mouth 2 (two) times  daily. 180 tablet 3   Multiple Vitamin (MULTIVITAMIN WITH MINERALS) TABS tablet Take 1 tablet by mouth daily.     multivitamin-lutein  (OCUVITE-LUTEIN ) CAPS capsule Take 1 capsule by mouth daily.     spironolactone  (ALDACTONE ) 25 MG tablet Take 1 tablet (25 mg total) by mouth 2 (two) times daily. 90 tablet 3   abiraterone  acetate (ZYTIGA ) 250 MG tablet Take 4 tablets (1,000 mg total) by mouth daily. Take on an empty stomach 1 hour before or 2 hours after a meal 120 tablet 2   predniSONE  (DELTASONE ) 5 MG tablet Take 1 tablet (5 mg total) by mouth daily with breakfast. 90 tablet 1   No current facility-administered medications for this visit.    PHYSICAL EXAMINATION: ECOG PERFORMANCE STATUS: 0 - Asymptomatic  There were no vitals filed for this visit. Wt Readings from Last 3 Encounters:  06/19/24 220 lb 1.6 oz (99.8 kg)  02/23/24 221 lb 6.4 oz (100.4 kg)  02/06/24 221 lb 14.4 oz (100.7 kg)     GENERAL:alert, no distress and comfortable SKIN: skin color, texture, turgor are normal, no rashes or significant lesions EYES: normal, Conjunctiva are pink and non-injected, sclera clear NECK: supple, thyroid normal size, non-tender, without nodularity LYMPH:  no palpable  lymphadenopathy in the cervical, axillary  LUNGS: clear to auscultation and percussion with normal breathing effort HEART: regular rate & rhythm and no murmurs and no lower extremity edema ABDOMEN:abdomen soft, non-tender and normal bowel sounds Musculoskeletal:no cyanosis of digits and no clubbing  NEURO: alert & oriented x 3 with fluent speech, no focal motor/sensory deficits  Physical Exam    LABORATORY DATA:  I have reviewed the data as listed    Latest Ref Rng & Units 09/18/2024    8:34 AM 06/19/2024   10:19 AM 02/06/2024   10:11 AM  CBC  WBC 4.0 - 10.5 K/uL 7.5  7.1  7.7   Hemoglobin 13.0 - 17.0 g/dL 89.4  89.1  89.7   Hematocrit 39.0 - 52.0 % 32.3  33.1  30.7   Platelets 150 - 400 K/uL 216  197  189          Latest Ref Rng & Units 09/18/2024    8:34 AM 06/19/2024   10:19 AM 02/06/2024   10:11 AM  CMP  Glucose 70 - 99 mg/dL 863  856  874   BUN 8 - 23 mg/dL 41  29  48   Creatinine 0.61 - 1.24 mg/dL 7.81  7.90  7.88   Sodium 135 - 145 mmol/L 137  135  134   Potassium 3.5 - 5.1 mmol/L 5.8  5.1  5.7   Chloride 98 - 111 mmol/L 105  107  106   CO2 22 - 32 mmol/L 20  20  21    Calcium  8.9 - 10.3 mg/dL 9.8  9.6  89.6   Total Protein 6.5 - 8.1 g/dL 7.6  7.7  8.1   Total Bilirubin 0.0 - 1.2 mg/dL 0.3  0.2  0.4   Alkaline Phos 38 - 126 U/L 77  90  64   AST 15 - 41 U/L 18  14  13    ALT 0 - 44 U/L 23  16  13        RADIOGRAPHIC STUDIES: I have personally reviewed the radiological images as listed and agreed with the findings in the report. No results found.    Orders Placed This Encounter  Procedures   CBC with Differential/Platelet    Standing Status:   Standing    Number of Occurrences:   50    Expiration Date:   09/18/2025   Comprehensive metabolic panel with GFR    Standing Status:   Standing    Number of Occurrences:   50    Expiration Date:   09/18/2025   PSA (For [facility])    Standing Status:   Standing    Number of Occurrences:   20    Expiration Date:   09/18/2025   All questions were answered. The patient knows to call the clinic with any problems, questions or concerns. No barriers to learning was detected. The total time spent in the appointment was 25 minutes, including review of chart and various tests results, discussions about plan of care and coordination of care plan     Onita Mattock, MD 09/18/2024

## 2024-09-18 NOTE — Assessment & Plan Note (Addendum)
 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 Bone scan and spinal MRI showed diffuse bone mets, PSA 524. Bone biopsy confirmed metastatic prostate cancer.  -I reviewed the incurable nature of his disease and treatment options and need for long treatment -he started ADT in hospital and his back pain has much improved, will continue ADT and change to Eligard  on 7/8. He previouly had reservation bout the therapy, after detailed discussion he agreed to start on 7/19.  -His PSMA scan showed multiple bone mets and pelvic node metastasis  -he has started systemic therapy with Zytiga  and prednisone  in June 2024, he is tolerating well overall.  Will continue. -Will monitor his PSA closely, it dropped to 1.2 in July 2024 and has been undetectable since Sep 2024 -We previously reviewed the incurable nature of his metastatic disease, and the need for long-term treatment.  -due to his CKD, he is not eligible for zometa

## 2024-09-19 ENCOUNTER — Other Ambulatory Visit: Payer: Self-pay

## 2024-09-19 ENCOUNTER — Other Ambulatory Visit

## 2024-09-19 ENCOUNTER — Ambulatory Visit: Admitting: Hematology

## 2024-09-20 ENCOUNTER — Other Ambulatory Visit: Payer: Self-pay | Admitting: Nurse Practitioner

## 2024-09-20 LAB — HGB FRACTIONATION CASCADE
Hgb A2: 3.4 % — ABNORMAL HIGH (ref 1.8–3.2)
Hgb A: 68.8 % — ABNORMAL LOW (ref 96.4–98.8)
Hgb F: 0.9 % (ref 0.0–2.0)
Hgb S: 26.9 % — ABNORMAL HIGH

## 2024-09-21 ENCOUNTER — Other Ambulatory Visit: Payer: Self-pay | Admitting: *Deleted

## 2024-09-21 ENCOUNTER — Other Ambulatory Visit: Payer: Self-pay

## 2024-09-21 ENCOUNTER — Encounter: Payer: Self-pay | Admitting: Hematology

## 2024-09-23 ENCOUNTER — Other Ambulatory Visit: Payer: Self-pay | Admitting: Nurse Practitioner

## 2024-09-24 ENCOUNTER — Other Ambulatory Visit: Payer: Self-pay

## 2024-09-24 ENCOUNTER — Other Ambulatory Visit (HOSPITAL_COMMUNITY): Payer: Self-pay

## 2024-09-25 ENCOUNTER — Other Ambulatory Visit: Payer: Self-pay | Admitting: Pharmacy Technician

## 2024-09-25 ENCOUNTER — Other Ambulatory Visit: Payer: Self-pay

## 2024-09-25 NOTE — Progress Notes (Signed)
 Specialty Pharmacy Refill Coordination Note  Carlos Martin is a 68 y.o. male contacted today regarding refills of specialty medication(s) Abiraterone  Acetate (ZYTIGA )   Patient requested Delivery   Delivery date: 09/27/24   Verified address: [address] [place]    Medication will be filled on: 09/26/24

## 2024-10-07 ENCOUNTER — Encounter: Payer: Self-pay | Admitting: Hematology

## 2024-10-12 ENCOUNTER — Telehealth: Payer: Self-pay | Admitting: Hematology

## 2024-10-12 NOTE — Telephone Encounter (Signed)
 Hello, this is courteous notification to advise patient cancelled genetic referral appointment for Mon Oct 15 2024 & does not want to reschedule. Thank you

## 2024-10-15 ENCOUNTER — Inpatient Hospital Stay

## 2024-10-15 ENCOUNTER — Inpatient Hospital Stay: Admitting: Genetic Counselor

## 2024-10-17 ENCOUNTER — Other Ambulatory Visit: Payer: Self-pay

## 2024-10-19 ENCOUNTER — Other Ambulatory Visit: Payer: Self-pay

## 2024-10-19 NOTE — Progress Notes (Signed)
 Specialty Pharmacy Refill Coordination Note  Carlos Martin is a 69 y.o. male contacted today regarding refills of specialty medication(s) Abiraterone  Acetate (ZYTIGA )   Patient requested Delivery   Delivery date: 10/22/24   Verified address: [address] [place] [id number]   Medication will be filled on: 10/19/24

## 2024-10-31 ENCOUNTER — Ambulatory Visit: Payer: Self-pay

## 2024-10-31 NOTE — Progress Notes (Unsigned)
 Carlos Martin

## 2024-11-08 ENCOUNTER — Other Ambulatory Visit: Payer: Self-pay

## 2024-11-09 ENCOUNTER — Other Ambulatory Visit: Payer: Self-pay | Admitting: Pharmacy Technician

## 2024-11-09 ENCOUNTER — Encounter: Payer: Self-pay | Admitting: Hematology

## 2024-11-09 ENCOUNTER — Other Ambulatory Visit: Payer: Self-pay

## 2024-11-12 ENCOUNTER — Other Ambulatory Visit: Payer: Self-pay

## 2024-11-12 ENCOUNTER — Other Ambulatory Visit (HOSPITAL_COMMUNITY): Payer: Self-pay

## 2024-11-13 ENCOUNTER — Other Ambulatory Visit: Payer: Self-pay

## 2024-11-14 ENCOUNTER — Other Ambulatory Visit: Payer: Self-pay

## 2024-11-14 ENCOUNTER — Encounter: Payer: Self-pay | Admitting: Hematology

## 2024-11-15 ENCOUNTER — Other Ambulatory Visit: Payer: Self-pay

## 2024-11-19 ENCOUNTER — Other Ambulatory Visit: Payer: Self-pay

## 2024-11-20 ENCOUNTER — Encounter: Payer: Self-pay | Admitting: Hematology

## 2024-11-20 ENCOUNTER — Other Ambulatory Visit: Payer: Self-pay

## 2024-11-20 ENCOUNTER — Other Ambulatory Visit (HOSPITAL_COMMUNITY): Payer: Self-pay

## 2024-11-21 ENCOUNTER — Other Ambulatory Visit: Payer: Self-pay

## 2024-11-21 ENCOUNTER — Other Ambulatory Visit: Payer: Self-pay | Admitting: Pharmacy Technician

## 2024-11-21 NOTE — Progress Notes (Signed)
 Specialty Pharmacy Refill Coordination Note  Carlos Martin is a 69 y.o. male contacted today regarding refills of specialty medication(s) Abiraterone  Acetate (ZYTIGA )   Patient requested No data recorded  Delivery date: 11/26/24   Verified address: [address] [place] [id number]   Medication will be filled on: 11/23/24

## 2024-11-23 ENCOUNTER — Other Ambulatory Visit: Payer: Self-pay

## 2024-12-18 ENCOUNTER — Inpatient Hospital Stay

## 2024-12-18 ENCOUNTER — Inpatient Hospital Stay: Admitting: Hematology
